# Patient Record
Sex: Female | Born: 1940
Health system: Southern US, Community
[De-identification: ages and names within clinical notes are randomized; demographics above are authoritative.]

## PROBLEM LIST (undated history)

## (undated) DIAGNOSIS — C50919 Malignant neoplasm of unspecified site of unspecified female breast: Secondary | ICD-10-CM

## (undated) DIAGNOSIS — H906 Mixed conductive and sensorineural hearing loss, bilateral: Secondary | ICD-10-CM

## (undated) DIAGNOSIS — K5000 Crohn's disease of small intestine without complications: Secondary | ICD-10-CM

## (undated) DIAGNOSIS — M545 Low back pain, unspecified: Secondary | ICD-10-CM

## (undated) DIAGNOSIS — M722 Plantar fascial fibromatosis: Secondary | ICD-10-CM

## (undated) DIAGNOSIS — L659 Nonscarring hair loss, unspecified: Secondary | ICD-10-CM

## (undated) DIAGNOSIS — K5790 Diverticulosis of intestine, part unspecified, without perforation or abscess without bleeding: Secondary | ICD-10-CM

## (undated) DIAGNOSIS — F605 Obsessive-compulsive personality disorder: Secondary | ICD-10-CM

## (undated) DIAGNOSIS — K449 Diaphragmatic hernia without obstruction or gangrene: Secondary | ICD-10-CM

## (undated) DIAGNOSIS — M1990 Unspecified osteoarthritis, unspecified site: Secondary | ICD-10-CM

## (undated) DIAGNOSIS — K2100 Gastro-esophageal reflux disease with esophagitis, without bleeding: Secondary | ICD-10-CM

## (undated) DIAGNOSIS — E785 Hyperlipidemia, unspecified: Secondary | ICD-10-CM

## (undated) DIAGNOSIS — I10 Essential (primary) hypertension: Secondary | ICD-10-CM

## (undated) DIAGNOSIS — K222 Esophageal obstruction: Secondary | ICD-10-CM

## (undated) DIAGNOSIS — K469 Unspecified abdominal hernia without obstruction or gangrene: Secondary | ICD-10-CM

## (undated) DIAGNOSIS — M810 Age-related osteoporosis without current pathological fracture: Secondary | ICD-10-CM

## (undated) DIAGNOSIS — K5900 Constipation, unspecified: Secondary | ICD-10-CM

## (undated) DIAGNOSIS — H269 Unspecified cataract: Secondary | ICD-10-CM

## (undated) DIAGNOSIS — M62838 Other muscle spasm: Secondary | ICD-10-CM

## (undated) DIAGNOSIS — K6289 Other specified diseases of anus and rectum: Secondary | ICD-10-CM

## (undated) DIAGNOSIS — K623 Rectal prolapse: Secondary | ICD-10-CM

## (undated) DIAGNOSIS — K589 Irritable bowel syndrome without diarrhea: Secondary | ICD-10-CM

## (undated) DIAGNOSIS — G43109 Migraine with aura, not intractable, without status migrainosus: Secondary | ICD-10-CM

## (undated) DIAGNOSIS — E039 Hypothyroidism, unspecified: Secondary | ICD-10-CM

## (undated) HISTORY — DX: Mixed conductive and sensorineural hearing loss, bilateral: H90.6

## (undated) HISTORY — DX: Malignant neoplasm of unspecified site of unspecified female breast: C50.919

## (undated) HISTORY — DX: Gastro-esophageal reflux disease with esophagitis, without bleeding: K21.00

## (undated) HISTORY — PX: TONSILLECTOMY: SUR1361

## (undated) HISTORY — DX: Plantar fascial fibromatosis: M72.2

## (undated) HISTORY — DX: Diaphragmatic hernia without obstruction or gangrene: K44.9

## (undated) HISTORY — DX: Rectal prolapse: K62.3

## (undated) HISTORY — DX: Irritable bowel syndrome, unspecified: K58.9

## (undated) HISTORY — PX: BREAST LUMPECTOMY: SHX2

## (undated) HISTORY — PX: ENTEROCELE REPAIR: SHX623

## (undated) HISTORY — DX: Esophageal obstruction: K22.2

## (undated) HISTORY — DX: Hyperlipidemia, unspecified: E78.5

## (undated) HISTORY — DX: Crohn's disease of small intestine without complications: K50.00

## (undated) HISTORY — DX: Low back pain, unspecified: M54.50

## (undated) HISTORY — DX: Low back pain: M54.5

## (undated) HISTORY — DX: Unspecified cataract: H26.9

## (undated) HISTORY — PX: OTHER SURGICAL HISTORY: SHX169

## (undated) HISTORY — DX: Unspecified osteoarthritis, unspecified site: M19.90

## (undated) HISTORY — PX: OOPHORECTOMY: SHX86

## (undated) HISTORY — DX: Age-related osteoporosis without current pathological fracture: M81.0

## (undated) HISTORY — DX: Essential (primary) hypertension: I10

## (undated) HISTORY — PX: HERNIA REPAIR: SHX51

## (undated) HISTORY — DX: Migraine with aura, not intractable, without status migrainosus: G43.109

## (undated) HISTORY — DX: Other specified diseases of anus and rectum: K62.89

## (undated) HISTORY — PX: APPENDECTOMY: SHX54

## (undated) HISTORY — DX: Hypothyroidism, unspecified: E03.9

## (undated) HISTORY — DX: Nonscarring hair loss, unspecified: L65.9

## (undated) HISTORY — DX: Unspecified abdominal hernia without obstruction or gangrene: K46.9

## (undated) HISTORY — DX: Constipation, unspecified: K59.00

## (undated) HISTORY — DX: Gastro-esophageal reflux disease with esophagitis: K21.0

## (undated) HISTORY — PX: DILATION AND CURETTAGE OF UTERUS: SHX78

## (undated) HISTORY — DX: Other muscle spasm: M62.838

## (undated) HISTORY — DX: Obsessive-compulsive personality disorder: F60.5

## (undated) HISTORY — DX: Diverticulosis of intestine, part unspecified, without perforation or abscess without bleeding: K57.90

---

## 1976-06-08 HISTORY — PX: BASAL CELL CARCINOMA EXCISION: SHX1214

## 1997-12-20 ENCOUNTER — Ambulatory Visit (HOSPITAL_BASED_OUTPATIENT_CLINIC_OR_DEPARTMENT_OTHER): Admission: RE | Admit: 1997-12-20 | Discharge: 1997-12-20 | Payer: Self-pay | Admitting: Plastic Surgery

## 1998-07-24 ENCOUNTER — Other Ambulatory Visit: Admission: RE | Admit: 1998-07-24 | Discharge: 1998-07-24 | Payer: Self-pay | Admitting: Obstetrics & Gynecology

## 1999-06-13 ENCOUNTER — Ambulatory Visit (HOSPITAL_COMMUNITY): Admission: RE | Admit: 1999-06-13 | Discharge: 1999-06-13 | Payer: Self-pay | Admitting: Internal Medicine

## 1999-09-02 ENCOUNTER — Other Ambulatory Visit: Admission: RE | Admit: 1999-09-02 | Discharge: 1999-09-02 | Payer: Self-pay | Admitting: Obstetrics & Gynecology

## 2000-03-05 ENCOUNTER — Other Ambulatory Visit: Admission: RE | Admit: 2000-03-05 | Discharge: 2000-03-05 | Payer: Self-pay | Admitting: Obstetrics & Gynecology

## 2001-03-28 ENCOUNTER — Other Ambulatory Visit: Admission: RE | Admit: 2001-03-28 | Discharge: 2001-03-28 | Payer: Self-pay | Admitting: Obstetrics & Gynecology

## 2001-05-26 ENCOUNTER — Emergency Department (HOSPITAL_COMMUNITY): Admission: EM | Admit: 2001-05-26 | Discharge: 2001-05-26 | Payer: Self-pay

## 2001-11-03 ENCOUNTER — Other Ambulatory Visit: Admission: RE | Admit: 2001-11-03 | Discharge: 2001-11-03 | Payer: Self-pay | Admitting: Obstetrics & Gynecology

## 2002-11-07 ENCOUNTER — Other Ambulatory Visit: Admission: RE | Admit: 2002-11-07 | Discharge: 2002-11-07 | Payer: Self-pay | Admitting: Obstetrics and Gynecology

## 2004-03-06 ENCOUNTER — Other Ambulatory Visit: Admission: RE | Admit: 2004-03-06 | Discharge: 2004-03-06 | Payer: Self-pay | Admitting: Obstetrics & Gynecology

## 2005-05-11 ENCOUNTER — Other Ambulatory Visit: Admission: RE | Admit: 2005-05-11 | Discharge: 2005-05-11 | Payer: Self-pay | Admitting: Obstetrics & Gynecology

## 2006-06-08 HISTORY — PX: HAIR TRANSPLANT: SHX1719

## 2006-10-06 ENCOUNTER — Ambulatory Visit: Payer: Self-pay | Admitting: Internal Medicine

## 2006-10-21 ENCOUNTER — Ambulatory Visit: Payer: Self-pay | Admitting: Internal Medicine

## 2006-11-09 ENCOUNTER — Ambulatory Visit: Payer: Self-pay | Admitting: Internal Medicine

## 2006-12-01 ENCOUNTER — Encounter: Admission: RE | Admit: 2006-12-01 | Discharge: 2006-12-06 | Payer: Self-pay | Admitting: Internal Medicine

## 2007-10-29 DIAGNOSIS — K4090 Unilateral inguinal hernia, without obstruction or gangrene, not specified as recurrent: Secondary | ICD-10-CM | POA: Insufficient documentation

## 2007-10-29 DIAGNOSIS — R51 Headache: Secondary | ICD-10-CM

## 2007-10-29 DIAGNOSIS — K5730 Diverticulosis of large intestine without perforation or abscess without bleeding: Secondary | ICD-10-CM | POA: Insufficient documentation

## 2007-10-29 DIAGNOSIS — R519 Headache, unspecified: Secondary | ICD-10-CM | POA: Insufficient documentation

## 2007-10-29 DIAGNOSIS — K623 Rectal prolapse: Secondary | ICD-10-CM | POA: Insufficient documentation

## 2007-10-29 DIAGNOSIS — I10 Essential (primary) hypertension: Secondary | ICD-10-CM

## 2007-10-29 DIAGNOSIS — K589 Irritable bowel syndrome without diarrhea: Secondary | ICD-10-CM

## 2007-10-29 DIAGNOSIS — Z8719 Personal history of other diseases of the digestive system: Secondary | ICD-10-CM

## 2009-07-26 ENCOUNTER — Encounter: Admission: RE | Admit: 2009-07-26 | Discharge: 2009-07-26 | Payer: Self-pay | Admitting: General Surgery

## 2009-07-29 ENCOUNTER — Ambulatory Visit: Admission: RE | Admit: 2009-07-29 | Discharge: 2009-09-02 | Payer: Self-pay | Admitting: Radiation Oncology

## 2009-08-14 ENCOUNTER — Encounter: Admission: RE | Admit: 2009-08-14 | Discharge: 2009-08-14 | Payer: Self-pay | Admitting: General Surgery

## 2009-08-19 ENCOUNTER — Ambulatory Visit (HOSPITAL_BASED_OUTPATIENT_CLINIC_OR_DEPARTMENT_OTHER): Admission: RE | Admit: 2009-08-19 | Discharge: 2009-08-19 | Payer: Self-pay | Admitting: General Surgery

## 2009-09-01 LAB — WOUND CULTURE

## 2009-09-04 ENCOUNTER — Encounter: Payer: Self-pay | Admitting: Internal Medicine

## 2009-09-04 ENCOUNTER — Ambulatory Visit: Payer: Self-pay | Admitting: Oncology

## 2009-09-04 LAB — CBC WITH DIFFERENTIAL/PLATELET
BASO%: 0.4 % (ref 0.0–2.0)
LYMPH%: 11.3 % — ABNORMAL LOW (ref 14.0–49.7)
MONO#: 0.4 10*3/uL (ref 0.1–0.9)
MONO%: 7.5 % (ref 0.0–14.0)
NEUT#: 4.4 10*3/uL (ref 1.5–6.5)
NEUT%: 77.1 % — ABNORMAL HIGH (ref 38.4–76.8)
Platelets: 365 10*3/uL (ref 145–400)
RBC: 4.15 10*6/uL (ref 3.70–5.45)
RDW: 12.2 % (ref 11.2–14.5)
lymph#: 0.6 10*3/uL — ABNORMAL LOW (ref 0.9–3.3)

## 2009-09-04 LAB — COMPREHENSIVE METABOLIC PANEL
Albumin: 4.2 g/dL (ref 3.5–5.2)
BUN: 18 mg/dL (ref 6–23)
Calcium: 9.4 mg/dL (ref 8.4–10.5)
Chloride: 102 mEq/L (ref 96–112)
Creatinine, Ser: 0.81 mg/dL (ref 0.40–1.20)
Glucose, Bld: 93 mg/dL (ref 70–99)
Potassium: 4.2 mEq/L (ref 3.5–5.3)
Total Bilirubin: 0.3 mg/dL (ref 0.3–1.2)
Total Protein: 6.8 g/dL (ref 6.0–8.3)

## 2009-10-28 ENCOUNTER — Encounter: Payer: Self-pay | Admitting: Internal Medicine

## 2009-10-30 ENCOUNTER — Encounter: Payer: Self-pay | Admitting: Internal Medicine

## 2009-11-01 ENCOUNTER — Ambulatory Visit: Payer: Self-pay | Admitting: Oncology

## 2009-11-05 ENCOUNTER — Encounter: Payer: Self-pay | Admitting: Internal Medicine

## 2010-02-21 ENCOUNTER — Ambulatory Visit: Payer: Self-pay | Admitting: Oncology

## 2010-02-25 LAB — COMPREHENSIVE METABOLIC PANEL
ALT: 12 U/L (ref 0–35)
Albumin: 4.3 g/dL (ref 3.5–5.2)
Chloride: 105 mEq/L (ref 96–112)
Glucose, Bld: 89 mg/dL (ref 70–99)
Potassium: 4.5 mEq/L (ref 3.5–5.3)
Sodium: 140 mEq/L (ref 135–145)
Total Protein: 6.9 g/dL (ref 6.0–8.3)

## 2010-02-25 LAB — CBC WITH DIFFERENTIAL/PLATELET
BASO%: 1.1 % (ref 0.0–2.0)
EOS%: 1.4 % (ref 0.0–7.0)
HCT: 41.1 % (ref 34.8–46.6)
HGB: 13.9 g/dL (ref 11.6–15.9)
LYMPH%: 26.4 % (ref 14.0–49.7)
MCH: 33 pg (ref 25.1–34.0)
MCHC: 33.9 g/dL (ref 31.5–36.0)
MCV: 97.4 fL (ref 79.5–101.0)
MONO#: 0.4 10*3/uL (ref 0.1–0.9)
MONO%: 8.8 % (ref 0.0–14.0)
Platelets: 287 10*3/uL (ref 145–400)
RBC: 4.22 10*6/uL (ref 3.70–5.45)
WBC: 4.9 10*3/uL (ref 3.9–10.3)

## 2010-03-04 ENCOUNTER — Encounter: Payer: Self-pay | Admitting: Internal Medicine

## 2010-06-08 HISTORY — PX: OTHER SURGICAL HISTORY: SHX169

## 2010-07-08 NOTE — Letter (Signed)
[facility]   Imported By: Sherian Rein 10/03/2009 08:10:04  _____________________________________________________________________  External Attachment:    Type:   Image     Comment:   External Document

## 2010-07-08 NOTE — Letter (Signed)
[facility]   Imported By: Lester Florence 11/21/2009 10:40:15  _____________________________________________________________________  External Attachment:    Type:   Image     Comment:   External Document

## 2010-07-08 NOTE — Letter (Signed)
Summary: [facility]   Imported By: Sherian Rein 11/11/2009 15:08:27  _____________________________________________________________________  External Attachment:    Type:   Image     Comment:   External Document

## 2010-07-08 NOTE — Letter (Signed)
Summary: [facility]  [facility]   Imported By: Sherian Rein 03/31/2010 12:24:04  _____________________________________________________________________  External Attachment:    Type:   Image     Comment:   External Document

## 2010-08-29 LAB — DIFFERENTIAL
Basophils Absolute: 0 10*3/uL (ref 0.0–0.1)
Basophils Relative: 1 % (ref 0–1)
Eosinophils Absolute: 0 10*3/uL (ref 0.0–0.7)
Lymphocytes Relative: 20 % (ref 12–46)
Monocytes Relative: 9 % (ref 3–12)
Neutrophils Relative %: 70 % (ref 43–77)

## 2010-08-29 LAB — COMPREHENSIVE METABOLIC PANEL
ALT: 19 U/L (ref 0–35)
Alkaline Phosphatase: 72 U/L (ref 39–117)
BUN: 17 mg/dL (ref 6–23)
Calcium: 9.3 mg/dL (ref 8.4–10.5)
Creatinine, Ser: 0.72 mg/dL (ref 0.4–1.2)
Total Bilirubin: 0.5 mg/dL (ref 0.3–1.2)
Total Protein: 6.9 g/dL (ref 6.0–8.3)

## 2010-08-29 LAB — CBC
HCT: 40.3 % (ref 36.0–46.0)
Hemoglobin: 14 g/dL (ref 12.0–15.0)
MCHC: 34.8 g/dL (ref 30.0–36.0)
Platelets: 332 10*3/uL (ref 150–400)
RDW: 12.1 % (ref 11.5–15.5)

## 2010-10-21 NOTE — Assessment & Plan Note (Signed)
 HEALTHCARE                         GASTROENTEROLOGY OFFICE NOTE   SHELEEN, CONCHAS                        MRN:          [id number]  DATE:11/09/2006                            DOB:          1940/06/25    Ms. Hor is a 70 year old white female who has problems with evacuation  of the stool, leakage of the stool, and some constipation.  We have  recently completed the colonoscopic exam which confirms presence of  severe diverticulosis of the left and right colon, predominantly of the  left colon with some narrowing of the left colon.  She was not prepped  well despite taking the entire MiraLax solution.  She is back to her  problems of leakage of stool almost every time she urinates.  We have  put her on a stool softener one or two a day and Benefiber 3 grams  daily.  She has a history of rectocele and enterocele repaired at [facility]  in 2003 with culdoplasty by Dr. Farrel Conners.   Today we have discussed further plans for her evaluation and treatment.  Her problems are quite uncomfortable and more of a nuisance than a  serious problem, but it limits her quality of life.   PLAN:  1. The patient is going to see Dr. Konrad Dolores for evaluation of      recurrent rectocele.  2. Consider going back to [facility] to be reassessed for recurrent      rectocele.  3. Continue softener and high fiber diet.  4. Use glycerin suppositories to facilitate rectal emptying.     Hedwig Morton. Juanda Chance, MD  Electronically Signed    DMB/MedQ  DD: 11/09/2006  DT: 11/09/2006  Job #: [id number]   cc:   Lenon Curt. Chilton Si, M.D.  Freddy Finner, M.D.

## 2010-10-24 NOTE — Assessment & Plan Note (Signed)
[facility]                         GASTROENTEROLOGY OFFICE NOTE   Marilyn [name], Marilyn [name]                        MRN:          [id number]  DATE:10/06/2006                            DOB:          Apr 02, 1941    Marilyn [name] is a very nice 70 year old white female.  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  She comes today because of several accidents  of rectal leakage; one occurred while she was vacationing over the  [place] vacation on a trip.  She thought she passed some flatus, but it  was some brownish liquid.  It is her impression that she has decreased  rectal sphincter tone.  After I saw her in 2001, she was referred to  [facility], to Dr. Doy Hutching, for repair of enterocele. She had a prolapse of  the small bowel into the rectum and in 2003 underwent culdoplasty at  [facility] for repair of the enterocele.  She at that time also had a  cystometrogram at [facility], and they determined that her bladder function  was normal.  During exploratory laparotomy in 2003, she also underwent  bilateral salpingo-oophorectomy.   The patient has irregular bowel habits.  She takes fiber pills 2 a day  to total of about 1300 mg of additional fiber.  She has frequent bowel  movements with occasional diarrhea and urgency.   MEDICATIONS:  1. Prempro 0.3 mg/1.5 daily.  2. Amlodipine 5 mg p.o. daily.  3. Diclofenac 75 mg every other day.  4. Flutamide 125 mg 2 daily.  5. Vitamin E.  6. Multivitamins.  7. Calcium.  8. Selenium.  9. Vitamin C  10.Glucosamine.  11.Aspirin 81 mg daily.  12.Fiber 675 mg 2 a day.   PAST MEDICAL HISTORY:  Significant for high blood pressure currently  under control.   OPERATIONS:  1. Exploratory laparotomy  2. Culdoplasty.  3. Appendectomy in 1965.  4. Tonsillectomy.  5. Right eye surgery.   FAMILY  HISTORY:  Negative for colon cancer.  Breast cancer in aunt.  Alcoholism in mother, uncle, brother, and nephew.   SOCIAL HISTORY:  Single.  Has master's degree.  She is retired.  The  patient does not smoke.  She does not drink alcohol.   REVIEW OF SYSTEMS:  Positive for eye glasses, allergies, arthritis,  sleeping problems, mood changes, question of heart murmur.   PHYSICAL EXAMINATION:  VITAL SIGNS:  Blood pressure 114/72, pulse 68.  Weight 151 pounds.  GENERAL:  She was alert and oriented, in no distress.  LUNGS:  Clear to auscultation.  COR:  Normal S1, normal S2.  ABDOMEN:  Soft. Normoactive bowel sounds.  There is no tenderness, no  palpable mass, no tympany.  RECTAL: Endoscopic exam shows normal perianal area, no prolapse or skin  tears.  Sphincter tone was decreased.  She had a weak squeeze.  There  was a spacious rectal ampulla with redundant rectal tissue suggestive of  a rectocele.  Stool was soft, hemoccult negative.  There were no  internal hemorrhoids.   IMPRESSION:  A 70 year old white female with history of universal  diverticulosis of the left and right colon, now with decreased rectal  sphincter tone and suggestion of pelvic relaxation causing mild  rectocele.  She had a recent repair of enterocele which effectively  prevented the prolapse of the small bowel but really did not alter her  pelvic relaxation which is causing her to not evacuate and have leakage  at times.   PLAN:  I have discussed the treatment with the patient.  There is really  no good effective treatment.  She needs to build up a good pelvic muscle  tone.  For that reason, I would encourage her to stay very active,  especially walk and do abdominal exercises.  I also feel that she needs  to eat more fiber.  If we can increase the bulk of the stool.  We are  increasing her to take Fibercon 3 g a day and in addition give her a  high-fiber diet.  Would like her to check back with me in a couple of   months.   Colonoscopy has been scheduled to assess the severity of the  diverticulosis.  The patient is interested in being updated as to the  severity of it to rule out partial left colon obstruction with the  diverticulosis.     Hedwig Morton. Juanda Chance, MD  Electronically Signed    DMB/MedQ  DD: 10/06/2006  DT: 10/06/2006  Job #: [id number]   cc:   Lenon Curt. Chilton Si, M.D.

## 2010-10-29 ENCOUNTER — Other Ambulatory Visit: Payer: Self-pay | Admitting: Dermatology

## 2011-03-05 ENCOUNTER — Encounter (HOSPITAL_BASED_OUTPATIENT_CLINIC_OR_DEPARTMENT_OTHER): Payer: Medicare Other | Admitting: Oncology

## 2011-03-05 DIAGNOSIS — E559 Vitamin D deficiency, unspecified: Secondary | ICD-10-CM

## 2011-03-05 DIAGNOSIS — Z170 Estrogen receptor positive status [ER+]: Secondary | ICD-10-CM

## 2011-03-05 DIAGNOSIS — C50419 Malignant neoplasm of upper-outer quadrant of unspecified female breast: Secondary | ICD-10-CM

## 2011-05-11 ENCOUNTER — Other Ambulatory Visit: Payer: Self-pay | Admitting: *Deleted

## 2011-05-11 DIAGNOSIS — C50419 Malignant neoplasm of upper-outer quadrant of unspecified female breast: Secondary | ICD-10-CM

## 2011-05-11 MED ORDER — TAMOXIFEN CITRATE 20 MG PO TABS
20.0000 mg | ORAL_TABLET | Freq: Every day | ORAL | Status: AC
Start: 2011-05-11 — End: 2011-06-10

## 2011-06-10 DIAGNOSIS — D3131 Benign neoplasm of right choroid: Secondary | ICD-10-CM | POA: Insufficient documentation

## 2011-07-08 ENCOUNTER — Encounter (INDEPENDENT_AMBULATORY_CARE_PROVIDER_SITE_OTHER): Payer: Self-pay

## 2011-08-31 DIAGNOSIS — L249 Irritant contact dermatitis, unspecified cause: Secondary | ICD-10-CM | POA: Insufficient documentation

## 2011-09-28 ENCOUNTER — Ambulatory Visit (INDEPENDENT_AMBULATORY_CARE_PROVIDER_SITE_OTHER): Payer: Medicare Other | Admitting: General Surgery

## 2011-09-28 ENCOUNTER — Encounter (INDEPENDENT_AMBULATORY_CARE_PROVIDER_SITE_OTHER): Payer: Self-pay | Admitting: General Surgery

## 2011-09-28 VITALS — BP 152/88 | Temp 97.3°F | Resp 16 | Ht 65.5 in | Wt 157.6 lb

## 2011-09-28 DIAGNOSIS — Z853 Personal history of malignant neoplasm of breast: Secondary | ICD-10-CM

## 2011-09-28 NOTE — Progress Notes (Signed)
Subjective:     Patient ID: Marilyn [name], female   DOB: [birth year], 71 y.o.   MRN: [id number]  HPI 69 yof s/p right lumpectomy, snbx, apbi, now on tamoxifen for stage I right breast cancer.  She is doing well.  She still has evolving seroma at her mammosite location but is otherwise doing ok since I last saw her.  She had recent mmg that shows post lumpectomy changes and overall decrease in lumpectomy site without any other concerning abnormalities.  She had u/s over lumpectomy site that shows a multiloculated seroma.    Review of Systems  Constitutional: Negative for fever, chills and unexpected weight change.  HENT: Negative for hearing loss, congestion, sore throat, trouble swallowing and voice change.   Eyes: Negative for visual disturbance.  Respiratory: Negative for cough and wheezing.   Cardiovascular: Negative for chest pain, palpitations and leg swelling.  Gastrointestinal: Negative for nausea, vomiting, abdominal pain, diarrhea, constipation, blood in stool, abdominal distention and anal bleeding.  Genitourinary: Negative for hematuria, vaginal bleeding and difficulty urinating.  Musculoskeletal: Negative for arthralgias.  Skin: Negative for rash and wound.  Neurological: Negative for seizures, syncope and headaches.  Hematological: Negative for adenopathy. Does not bruise/bleed easily.  Psychiatric/Behavioral: Negative for confusion.       Objective:   Physical Exam  Vitals reviewed. Constitutional: She appears well-developed and well-nourished.  Neck: Neck supple.  Pulmonary/Chest: Right breast exhibits no inverted nipple, no mass, no nipple discharge, no skin change and no tenderness. Left breast exhibits no inverted nipple, no mass, no nipple discharge, no skin change and no tenderness. Breasts are symmetrical.    Lymphadenopathy:    She has no cervical adenopathy.    She has no axillary adenopathy.       Right: No supraclavicular adenopathy present.       Left: No  supraclavicular adenopathy present.       Assessment:    History stage I right breast cancer    Plan:     I think this is just seroma cavity by exam, history and ultrasound.  She is going to continue her own exams monthly, repeat her u/s in six months.  I will plan on seeing her in one year unless something else occurs sooner.

## 2011-09-28 NOTE — Patient Instructions (Signed)

## 2011-12-11 ENCOUNTER — Ambulatory Visit
Admission: RE | Admit: 2011-12-11 | Discharge: 2011-12-11 | Disposition: A | Discharge status: Home / Self Care | Payer: Medicare Other | Source: Ambulatory Visit | Attending: Internal Medicine | Admitting: Internal Medicine

## 2011-12-11 ENCOUNTER — Other Ambulatory Visit: Payer: Self-pay | Admitting: Internal Medicine

## 2011-12-11 DIAGNOSIS — M549 Dorsalgia, unspecified: Secondary | ICD-10-CM

## 2012-01-08 ENCOUNTER — Telehealth: Payer: Self-pay | Admitting: Oncology

## 2012-01-08 NOTE — Telephone Encounter (Signed)
S/w pt confirming appts for 9/24 and 10/1.

## 2012-02-15 ENCOUNTER — Other Ambulatory Visit: Payer: Self-pay | Admitting: *Deleted

## 2012-02-15 NOTE — Progress Notes (Signed)
Lab report received from Dr Lenoria Farrier Thomasene Lot office dated 11/13/2011.

## 2012-03-01 ENCOUNTER — Other Ambulatory Visit: Payer: Medicare Other | Admitting: Lab

## 2012-03-08 ENCOUNTER — Telehealth: Payer: Self-pay | Admitting: *Deleted

## 2012-03-08 ENCOUNTER — Telehealth: Payer: Self-pay | Admitting: Internal Medicine

## 2012-03-08 ENCOUNTER — Ambulatory Visit (HOSPITAL_BASED_OUTPATIENT_CLINIC_OR_DEPARTMENT_OTHER): Payer: Medicare Other | Admitting: Oncology

## 2012-03-08 VITALS — BP 169/66 | HR 83 | Temp 98.6°F | Resp 20 | Ht 65.5 in | Wt 158.7 lb

## 2012-03-08 DIAGNOSIS — C50419 Malignant neoplasm of upper-outer quadrant of unspecified female breast: Secondary | ICD-10-CM

## 2012-03-08 DIAGNOSIS — C50919 Malignant neoplasm of unspecified site of unspecified female breast: Secondary | ICD-10-CM

## 2012-03-08 DIAGNOSIS — Z170 Estrogen receptor positive status [ER+]: Secondary | ICD-10-CM

## 2012-03-08 MED ORDER — TAMOXIFEN CITRATE 20 MG PO TABS: 20.0000 mg | ORAL_TABLET | Freq: Every day | ORAL | Status: DC

## 2012-03-08 MED ORDER — ALENDRONATE SODIUM 70 MG PO TABS: 70.0000 mg | ORAL_TABLET | ORAL | Status: DC

## 2012-03-08 NOTE — Telephone Encounter (Signed)
Left a message for Lori to call me back.

## 2012-03-08 NOTE — Telephone Encounter (Signed)
to see Marilyn [name], next available, "change in bowel habits;"  Called spoke with Tobi Bastos she will have the nurse call me back  Dr.brodie does not have any openings until Dec. 2013

## 2012-03-08 NOTE — Progress Notes (Signed)
ID: Marilyn [name]   DOB: 08-Dec-1940  MR#: [id number]  CSN#:[id number]  PCP: Kimber Relic, MD GYN:  SUSusy Frizzle. Dwain Sarna OTHER MD: Venancio Poisson, Audrie Lia   HISTORY OF PRESENT ILLNESS: Marilyn [name] had routine screening mammography June 17, 2009, showing dense breast tissue with new calcifications in the upper quadrant on the right breast.  She was recalled on January 12 for diagnostic right mammography and this suggested a benign area of calcification; however, posterior to these there was a set of new microcalcifications and biopsy was recommended.  This was performed on July 09, 2009, and showed ([id number]) an invasive ductal carcinoma with high-grade ductal carcinoma in situ.  The prognostic profile showed the tumor cells to be 93% estrogen receptor positive, 100% progesterone receptor positive, with a low proliferation marker at 11% and HER-2 negative with a ratio by CISH of 1.03.    With this information, the patient was referred to Dr Dwain Sarna and bilateral breast MRIs were obtained February 18.  There were only post-biopsy changes seen in the upper outer quadrant of the right breast.  The left breast and the rest of the scan were unremarkable.  With this information, Dr Dwain Sarna proceeded to left lumpectomy and sentinel lymph node sampling on August 19, 2009.  The final pathology here ([id number]) showed a 6 mm invasive ductal carcinoma, grade 1, with 0 of 4 sentinel lymph nodes involved.  Margins were negative and there was no evidence of lymphovascular invasion.    The patient was felt to be a good candidate for MammoSite radiation and she was referred to Dr Dayton Scrape.  After appropriate discussion, the patient agree to MammoSite radiation and this was performed between March 21 and August 30, 2009, the breast receiving 3400 cGy in 10 fractions. Her subsequent history is as detailed below.  INTERVAL HISTORY: Marilyn [name] returns today for followup of her breast cancer. The  interval history is unremarkable. She continues to be very busy with multiple volunteer activities and her 2 dogs at home.  REVIEW OF SYSTEMS: She is having some sleeping problems. We discussed the idea of always waking up at the same time in the morning to help the body I did use to falling asleep at the same time every night. Her cataracts are grown some and she is planning to have surgery under Dr. Dagoberto Ligas. She has occasional premature atrial beats, and sometimes after exertion she feels a little more short of breath than she thinks she should be. There are some urinary leakage syndrome's and mild psoriasis. She is having hot flashes from the tamoxifen but she can "deal with that". She's not having any other symptoms she is aware of from that medication. In a recent trip to [place] she noted her ankles were a little swollen. Otherwise a detailed review of systems was noncontributory  PAST MEDICAL HISTORY: Past Medical History  Diagnosis Date  . Hypertension   . Cancer     stage I right breast  1. Hypertension. 2. History of premature atrial arrhythmias. 3. History of Raynaud syndrome. 4. History of irritable bowel syndrome. 5. Status post tonsillectomy. 6. Status post inguinal hernia repair. 7. Status post strabismus repair. 8. Status post appendectomy. 9. Status post basal cell skin cancer removal in 1978. 10. History of D&C in 2003. 11. History of enterocele repair at [facility] with bilateral oophorectomies performed at the same time (1985). 12. Status post removal of a benign breast cyst. Status post remote history of migraines  PAST SURGICAL HISTORY: Past  Surgical History  Procedure Date  . Hernia repair   . Appendectomy   . Eye surgery   . Oophorectomy   . Breast surgery     right lumpectomy, snbx, apbi    FAMILY HISTORY No family history on file. The patient's father died with Alzheimer disease at the age of 29.  The patient's mother died at the age of 100 from a stroke;  she was a smoker.  The patient has 1 brother and 1 sister; as far as she knows they are in good health.  There is no significant history of breast cancer in the family other than 2 maternal aunts who had breast cancer in their 68s.  GYNECOLOGIC HISTORY: The patient is G0.  She was on Prempro for several years until February 2011  SOCIAL HISTORY: She worked for the [employer] for many years and also taught at [employer].  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  She also does a lot of bird watching.  Her 2 dogs currently are a Orthoptist and a terrier mix.   ADVANCED DIRECTIVES: in place; her HCPOA is Marilyn [name], who can be reached at [phone]  HEALTH MAINTENANCE: History  Substance Use Topics  . Smoking status: Never Smoker   . Smokeless tobacco: Not on file  . Alcohol Use: No     Colonoscopy:  PAP:  Bone density:  Lipid panel:  No Known Allergies  Current Outpatient Prescriptions  Medication Sig Dispense Refill  . AMLODIPINE BESYLATE PO Take by mouth.      . FLUTAMIDE PO Take by mouth.      . TAMOXIFEN CITRATE PO Take by mouth.        OBJECTIVE: Middle-aged white woman who appears well Filed Vitals:   03/08/12 1124  BP: 169/66  Pulse: 83  Temp: 98.6 F (37 C)  Resp: 20     Body mass index is 26.01 kg/(m^2).    ECOG FS: 0  Sclerae unicteric Oropharynx clear No cervical or supraclavicular adenopathy Lungs no rales or rhonchi Heart regular rate and rhythm, no premature beats or murmur noted Abd benign MSK no focal spinal tenderness, no peripheral edema Neuro: nonfocal Breasts: The right breast is status post lumpectomy and MammoSite radiation. There is no evidence of local recurrence. The right axilla is clear. The left breast is unremarkable  LAB RESULTS: Lab Results  Component Value Date   WBC 4.9 02/25/2010   NEUTROABS 3.0 02/25/2010   HGB 13.9 02/25/2010   HCT 41.1 02/25/2010   MCV 97.4  02/25/2010   PLT 287 02/25/2010      Chemistry      Component Value Date/Time   NA 140 02/25/2010 1356   K 4.5 02/25/2010 1356   CL 105 02/25/2010 1356   CO2 30 02/25/2010 1356   BUN 19 02/25/2010 1356   CREATININE 0.89 02/25/2010 1356      Component Value Date/Time   CALCIUM 9.5 02/25/2010 1356   ALKPHOS 58 02/25/2010 1356   AST 18 02/25/2010 1356   ALT 12 02/25/2010 1356   BILITOT 0.3 02/25/2010 1356       Lab Results  Component Value Date   LABCA2 17 08/14/2009    No components found with this basename: LABCA125    No results found for this basename: INR:1;PROTIME:1 in the last 168 hours  Urinalysis No results found for this basename: colorurine, appearanceur, labspec, phurine, glucoseu, hgbur, bilirubinur, ketonesur, proteinur, urobilinogen, nitrite, leukocytesur  STUDIES: Mammography January of this year and repeat right breast ultrasonography August of this year were both benign. DEXA scan also in August showed a T-2.3  ASSESSMENT: 71 y.o. [place] woman status post right lumpectomy and sentinel lymph node biopsy March 2011 for a T1b N0 grade 1 invasive ductal carcinoma, strongly estrogen and progesterone receptor positive with an MIB-1 of 11% and HER2 not amplified.  After MammoSite radiation, she started tamoxifen in late March 2011.    PLAN: Marilyn [name] going to consider switching to an aromatase inhibitor at this point, but since her last visit here new data has been published regarding extending tamoxifen to 10 years. The additional benefit of that is similar to that of switching to an aromatase inhibitor. Of course the grade disadvantage of the aromatase inhibitors is a loss of bone density and Marilyn [name] is already close to osteoporosis. Accordingly my recommendation to her was that she continue on tamoxifen for the full 10 years but also that she consider going on alendronate. We discussed the possible toxicities side effects and complications of that medication including  concerns regarding osteonecrosis of the jaw. I gave her written instructions on how to take the pill and wrote her a prescription for it.  We talked about some issues regarding ankle swelling, though that problem was an apparent today. My suggestion was that she try compression stockings and a low salt diet. Overall Marilyn [name] is doing great, with no evidence of disease recurrence. She will return to see me again in one year. She knows to call for any problems that may develop before that date.   Marilyn [name] C    03/08/2012

## 2012-03-08 NOTE — Telephone Encounter (Signed)
Spoke with Lawson Fiscal and scheduled patient on 03/09/12 at 2:30 PM with Mike Gip, PA

## 2012-03-10 ENCOUNTER — Other Ambulatory Visit: Payer: Medicare Other

## 2012-03-10 ENCOUNTER — Ambulatory Visit (INDEPENDENT_AMBULATORY_CARE_PROVIDER_SITE_OTHER): Payer: Medicare Other | Admitting: Physician Assistant

## 2012-03-10 ENCOUNTER — Encounter: Payer: Self-pay | Admitting: Physician Assistant

## 2012-03-10 VITALS — BP 124/70 | HR 72 | Ht 65.0 in | Wt 158.0 lb

## 2012-03-10 DIAGNOSIS — R198 Other specified symptoms and signs involving the digestive system and abdomen: Secondary | ICD-10-CM

## 2012-03-10 DIAGNOSIS — R140 Abdominal distension (gaseous): Secondary | ICD-10-CM

## 2012-03-10 DIAGNOSIS — R141 Gas pain: Secondary | ICD-10-CM

## 2012-03-10 DIAGNOSIS — R159 Full incontinence of feces: Secondary | ICD-10-CM

## 2012-03-10 DIAGNOSIS — Z853 Personal history of malignant neoplasm of breast: Secondary | ICD-10-CM

## 2012-03-10 DIAGNOSIS — R194 Change in bowel habit: Secondary | ICD-10-CM

## 2012-03-10 MED ORDER — MOVIPREP 100 G PO SOLR: 1.0000 | Freq: Once | ORAL | Status: AC

## 2012-03-10 NOTE — Progress Notes (Signed)
Subjective:    Patient ID: Marilyn [name], female    DOB: January 23, 1941, 71 y.o.   MRN: [id number]  HPI Marilyn [name] is a 71 year old female known to Dr. Lina Sar from prior colonoscopy. She last had colonoscopy in May of 2008 at that time was noted to have significant diverticular disease of the right and left colon and decreased sphincter tone. She underwent an enterocele repair at [facility] a few years ago, and also was diagnosed with breast cancer in 2011. She had a lumpectomy done of the left breast and then radiation for stage I disease. She has been on tamoxifen. She had recent evaluation with her gynecologist to suggested she consider repeat colonoscopy. Patient says that she has been told in the past that she probably has IBS. She has never been tested for celiac disease. She says she's had symptoms for years with intermittent abdominal bloating and discomfort urgency and occasional episodes of diarrhea with complete bowel evacuation She has had problems with fecal soilage over the past several years and eats a very high fiber diet which he says definitely helps. She says sometimes when she urinates she'll have some seepage of stool at this point past where protection all the time. She still has occasional episodes of urgency and rare episodes of fecal incontinence. As not noted any melena or hematochezia. She says she has been having increased frequency of bowel movements recently. On further questioning she does use artificial sweeteners and also has been eating dried apricots daily.. She does endorse  abdominal bloating and gas.    Review of Systems  Constitutional: Negative.   HENT: Negative.   Eyes: Negative.   Respiratory: Negative.   Cardiovascular: Negative.   Gastrointestinal: Positive for diarrhea and abdominal distention.  Genitourinary: Negative.   Musculoskeletal: Negative.   Skin: Negative.   Neurological: Negative.   Hematological: Negative.   Psychiatric/Behavioral:  Negative.    Outpatient Prescriptions Prior to Visit  Medication Sig Dispense Refill  . alendronate (FOSAMAX) 70 MG tablet Take 1 tablet (70 mg total) by mouth every 7 (seven) days. Take with a full glass of water on an empty stomach.  15 tablet  6  . AMLODIPINE BESYLATE PO Take by mouth.      . FLUTAMIDE PO Take by mouth.      . tamoxifen (NOLVADEX) 20 MG tablet Take 1 tablet (20 mg total) by mouth daily.  90 tablet  12   No Known Allergies Patient Active Problem List  Diagnosis  . HYPERTENSION  . INGUINAL HERNIA  . DIVERTICULOSIS, COLON  . IBS  . RECTAL PROLAPSE  . HEADACHE, CHRONIC  . Breast cancer       History   Social History  . Marital Status: Single    Spouse Name: N/A    Number of Children: 0  . Years of Education: N/A   Occupational History  . Not on file.   Social History Main Topics  . Smoking status: Never Smoker   . Smokeless tobacco: Never Used  . Alcohol Use: No  . Drug Use: No  . Sexually Active: Not on file   Other Topics Concern  . Not on file   Social History Narrative  . No narrative on file    Objective:   Physical Exam well-developed older white female in no acute distress, pleasant blood pressure 124/70 pulse 72 height 5 foot 5 weight 158. HEENT; nontraumatic normocephalic EOMI PERRLA sclera anicteric,Neck; Supple no JVD, Cardiovascular; regular rate and rhythm with S1-S2 no  murmur or gallop, Pulmonary; clear bilaterally,, Abdomen; soft basically nontender there is no palpable mass or hepatosplenomegaly bowel sounds are active, Rectal; exam not done, Extremities; no clubbing cyanosis or edema skin warm and dry, Psych; mood and affect normal and appropriate.        Assessment & Plan:  #47 72 year old female with history of breast cancer, last screening colonoscopy done 2008. #2 extensive diverticular disease #3 probable IBS, with intermittent urgency and diarrhea and frequent abdominal gas and bloating. Will rule out celiac disease #4  fecal incontinence secondary to pelvic floor dysfunction and sphincter laxity. She does not have complete incontinence generally. She does have ongoing issues with soilage #5 status post enterocele repair  Plan; continue fiber supplements on a daily basis and high-fiber diet, advised trying to limit 8 artificial sweeteners and decreasing intake of dried fruit with increased complaints of gas and more frequent bowel movements We'll check celiac panel Schedule for colonoscopy with Dr. Juanda Chance. Procedure discussed in detail with the patient and she is agreeable to proceed.

## 2012-03-10 NOTE — Patient Instructions (Addendum)
Please go to the basement level to have your labs drawn.  We sent the Moviprep to [facility]. We have scheduled the Colonoscopy with Dr. Juanda Chance on 05-03-2012 @ 2:30 PM   Colonoscopy A colonoscopy is an exam to evaluate your entire colon. In this exam, your colon is cleansed. A long fiberoptic tube is inserted through your rectum and into your colon. The fiberoptic scope (endoscope) is a long bundle of enclosed and very flexible fibers. These fibers transmit light to the area examined and send images from that area to your caregiver. Discomfort is usually minimal. You may be given a drug to help you sleep (sedative) during or prior to the procedure. This exam helps to detect lumps (tumors), polyps, inflammation, and areas of bleeding. Your caregiver may also take a small piece of tissue (biopsy) that will be examined under a microscope. LET YOUR CAREGIVER KNOW ABOUT:   Allergies to food or medicine.  Medicines taken, including vitamins, herbs, eyedrops, over-the-counter medicines, and creams.  Use of steroids (by mouth or creams).  Previous problems with anesthetics or numbing medicines.  History of bleeding problems or blood clots.  Previous surgery.  Other health problems, including diabetes and kidney problems.  Possibility of pregnancy, if this applies. BEFORE THE PROCEDURE   A clear liquid diet may be required for 2 days before the exam.  Ask your caregiver about changing or stopping your regular medications.  Liquid injections (enemas) or laxatives may be required.  A large amount of electrolyte solution may be given to you to drink over a short period of time. This solution is used to clean out your colon.  You should be present 60 minutes prior to your procedure or as directed by your caregiver. AFTER THE PROCEDURE   If you received a sedative or pain relieving medication, you will need to arrange for someone to drive you home.  Occasionally, there is a little  blood passed with the first bowel movement. Do not be concerned. FINDING OUT THE RESULTS OF YOUR TEST Not all test results are available during your visit. If your test results are not back during the visit, make an appointment with your caregiver to find out the results. Do not assume everything is normal if you have not heard from your caregiver or the medical facility. It is important for you to follow up on all of your test results. HOME CARE INSTRUCTIONS   It is not unusual to pass moderate amounts of gas and experience mild abdominal cramping following the procedure. This is due to air being used to inflate your colon during the exam. Walking or a warm pack on your belly (abdomen) may help.  You may resume all normal meals and activities after sedatives and medicines have worn off.  Only take over-the-counter or prescription medicines for pain, discomfort, or fever as directed by your caregiver. Do not use aspirin or blood thinners if a biopsy was taken. Consult your caregiver for medicine usage if biopsies were taken. SEEK IMMEDIATE MEDICAL CARE IF:   You have a fever.  You pass large blood clots or fill a toilet with blood following the procedure. This may also occur 10 to 14 days following the procedure. This is more likely if a biopsy was taken.  You develop abdominal pain that keeps getting worse and cannot be relieved with medicine. Document Released: 05/22/2000 Document Revised: 08/17/2011 Document Reviewed: 01/05/2008 [facility] Patient Information 2013 [place], [place]. 30 PM .

## 2012-03-11 LAB — RETICULIN ANTIBODIES, IGA W TITER: Reticulin Ab, IgA: NEGATIVE

## 2012-03-11 LAB — GLIADIN ANTIBODIES, SERUM: Gliadin IgG: 21.3 U/mL — ABNORMAL HIGH (ref <20)

## 2012-03-11 NOTE — Progress Notes (Signed)
Reviewed and agree with management. Violette Morneault D. Gabriellah Rabel, M.D., FACG  

## 2012-05-03 ENCOUNTER — Encounter: Payer: Medicare Other | Admitting: Internal Medicine

## 2012-05-13 ENCOUNTER — Telehealth: Payer: Self-pay | Admitting: Internal Medicine

## 2012-05-13 NOTE — Telephone Encounter (Signed)
Left a message for patient to call me. 

## 2012-05-13 NOTE — Telephone Encounter (Signed)
Spoke with patient and moved her procedure to 07/01/12 at 1:00/2:00 PM

## 2012-05-25 ENCOUNTER — Encounter: Payer: Medicare Other | Admitting: Internal Medicine

## 2012-06-20 ENCOUNTER — Ambulatory Visit (AMBULATORY_SURGERY_CENTER): Payer: Medicare Other | Admitting: *Deleted

## 2012-06-20 VITALS — Ht 65.0 in | Wt 158.6 lb

## 2012-06-20 DIAGNOSIS — R198 Other specified symptoms and signs involving the digestive system and abdomen: Secondary | ICD-10-CM

## 2012-06-20 DIAGNOSIS — R140 Abdominal distension (gaseous): Secondary | ICD-10-CM

## 2012-06-20 DIAGNOSIS — R194 Change in bowel habit: Secondary | ICD-10-CM

## 2012-06-20 DIAGNOSIS — R143 Flatulence: Secondary | ICD-10-CM

## 2012-06-20 DIAGNOSIS — R159 Full incontinence of feces: Secondary | ICD-10-CM

## 2012-06-20 MED ORDER — MOVIPREP 100 G PO SOLR: ORAL | Status: DC

## 2012-06-27 ENCOUNTER — Encounter: Payer: Self-pay | Admitting: *Deleted

## 2012-06-27 ENCOUNTER — Telehealth: Payer: Self-pay | Admitting: Internal Medicine

## 2012-06-27 NOTE — Telephone Encounter (Signed)
Error

## 2012-06-27 NOTE — Telephone Encounter (Signed)
Pt calling as she had some almonds and 1/2 tsp metamucil this morning on her cereal. Reassured her that her procedure would not be cancelled but not to have any additional almonds, nuts, etc or fiber supplements the rest of the week. We reviewed the rest of her instructions pertaining to diet. Pt states understanding.

## 2012-06-28 HISTORY — PX: OTHER SURGICAL HISTORY: SHX169

## 2012-07-01 ENCOUNTER — Encounter: Payer: Self-pay | Admitting: Internal Medicine

## 2012-07-01 ENCOUNTER — Ambulatory Visit (AMBULATORY_SURGERY_CENTER): Payer: Medicare Other | Admitting: Internal Medicine

## 2012-07-01 VITALS — BP 132/83 | HR 74 | Temp 98.6°F | Resp 16 | Ht 65.0 in | Wt 158.0 lb

## 2012-07-01 DIAGNOSIS — R198 Other specified symptoms and signs involving the digestive system and abdomen: Secondary | ICD-10-CM

## 2012-07-01 DIAGNOSIS — R159 Full incontinence of feces: Secondary | ICD-10-CM

## 2012-07-01 DIAGNOSIS — K6289 Other specified diseases of anus and rectum: Secondary | ICD-10-CM

## 2012-07-01 DIAGNOSIS — K623 Rectal prolapse: Secondary | ICD-10-CM

## 2012-07-01 DIAGNOSIS — D280 Benign neoplasm of vulva: Secondary | ICD-10-CM

## 2012-07-01 DIAGNOSIS — K5730 Diverticulosis of large intestine without perforation or abscess without bleeding: Secondary | ICD-10-CM

## 2012-07-01 DIAGNOSIS — K319 Disease of stomach and duodenum, unspecified: Secondary | ICD-10-CM

## 2012-07-01 DIAGNOSIS — K222 Esophageal obstruction: Secondary | ICD-10-CM

## 2012-07-01 HISTORY — DX: Esophageal obstruction: K22.2

## 2012-07-01 HISTORY — DX: Other specified diseases of anus and rectum: K62.89

## 2012-07-01 MED ORDER — DISPOSABLE ENEMA 19-7 GM/118ML RE ENEM: 1.0000 | ENEMA | Freq: Once | RECTAL | Status: DC

## 2012-07-01 MED ORDER — SODIUM CHLORIDE 0.9 % IV SOLN: 500.0000 mL | INTRAVENOUS | Status: DC

## 2012-07-01 MED ORDER — OMEPRAZOLE 20 MG PO CPDR: 20.0000 mg | DELAYED_RELEASE_CAPSULE | Freq: Every day | ORAL | Status: DC

## 2012-07-01 MED ORDER — MESALAMINE 1000 MG RE SUPP: 1000.0000 mg | Freq: Every day | RECTAL | Status: DC

## 2012-07-01 NOTE — Op Note (Addendum)
[facility] [address] [place], [address]   COLONOSCOPY PROCEDURE REPORT  PATIENT: Marilyn [name], Marilyn [name]  MR#: [id number] BIRTHDATE: [birth year] , 71  yrs. old GENDER: Female ENDOSCOPIST: Hart Carwin, MD REFERRED BY:  Murray Hodgkins, M.D. , Dr Konrad Dolores PROCEDURE DATE:  07/01/2012 PROCEDURE:   Colonoscopy with biopsy ASA CLASS:   Class II INDICATIONS:Rectal Bleeding, Change in bowel habits, and incontinence, s/p repair of enterocele at [facility], recurrent prolapse, last colon 2001, 2008, hx of incompetent rectal sphincter. MEDICATIONS: MAC sedation, administered by CRNA and Propofol (Diprivan) 140 mg IV  DESCRIPTION OF PROCEDURE:   After the risks and benefits and of the procedure were explained, informed consent was obtained.  A digital rectal exam revealed decreased sphincter tone.    The LB PCF-Q180AL [id number]  endoscope was introduced through the anus and advanced to the cecum, which was identified by both the appendix and ileocecal valve .  The quality of the prep was Moviprep fair .  The instrument was then slowly withdrawn as the colon was fully examined.     COLON FINDINGS: There was severe diverticulosis noted throughout the entire examined colon with associated tortuosity, luminal narrowing, colonic spasm and angulation.   Patch of erythema in the rectal ampulla c/w prolase, biopsies taken.     Retroflexed views revealed no abnormalities.     The scope was then withdrawn from the patient and the procedure completed.  COMPLICATIONS: There were no complications. ENDOSCOPIC IMPRESSION: 1.   There was severe diverticulosis noted throughout the entire examined colon 2.   Patch of erythema in the rectal ampulla c/w prolase, biopsies taken 3. incompetent rectal sphincter, s/p remote enterocele repair, ?? recurrence?  RECOMMENDATIONS: Await pathology results Canase supp 1000mg  hs, may benefit from vaginal pessary   REPEAT EXAM: In 10 year(s)  for  Colonoscopy.  cc:  _______________________________ eSignedHart Carwin, MD 07/01/2012 3:44 PM Revised: 07/01/2012 3:44 PM    PATIENT NAME:  [name], [name] MR#: [id number]

## 2012-07-01 NOTE — Progress Notes (Signed)
Called to room to assist during endoscopic procedure.  Patient ID and intended procedure confirmed with present staff. Received instructions for my participation in the procedure from the performing physician.  

## 2012-07-01 NOTE — Progress Notes (Signed)
Patient did not experience any of the following events: a burn prior to discharge; a fall within the facility; wrong site/side/patient/procedure/implant event; or a hospital transfer or hospital admission upon discharge from the facility. (G8907)Patient did not have preoperative order for IV antibiotic SSI prophylaxis. (G8918) ewm 

## 2012-07-01 NOTE — Patient Instructions (Addendum)
YOU HAD AN ENDOSCOPIC PROCEDURE TODAY AT THE  ENDOSCOPY CENTER: Refer to the procedure report that was given to you for any specific questions about what was found during the examination.  If the procedure report does not answer your questions, please call your gastroenterologist to clarify.  If you requested that your care partner not be given the details of your procedure findings, then the procedure report has been included in a sealed envelope for you to review at your convenience later.  YOU SHOULD EXPECT: Some feelings of bloating in the abdomen. Passage of more gas than usual.  Walking can help get rid of the air that was put into your GI tract during the procedure and reduce the bloating. If you had a lower endoscopy (such as a colonoscopy or flexible sigmoidoscopy) you may notice spotting of blood in your stool or on the toilet paper. If you underwent a bowel prep for your procedure, then you may not have a normal bowel movement for a few days.  DIET:   Drink plenty of fluids but you should avoid alcoholic beverages for 24 hours.dilation diet today as directed. Please resume regular diet tomorrow.   ACTIVITY: Your care partner should take you home directly after the procedure.  You should plan to take it easy, moving slowly for the rest of the day.  You can resume normal activity the day after the procedure however you should NOT DRIVE or use heavy machinery for 24 hours (because of the sedation medicines used during the test).    SYMPTOMS TO REPORT IMMEDIATELY: A gastroenterologist can be reached at any hour.  During normal business hours, 8:30 AM to 5:00 PM Monday through Friday, call [phone].  After hours and on weekends, please call the GI answering service at [phone](emergency number) who will take a message and have the physician on call contact you.   Following lower endoscopy (colonoscopy or flexible sigmoidoscopy):  Excessive amounts of blood in the  stool  Significant tenderness or worsening of abdominal pains  Swelling of the abdomen that is new, acute  Fever of 100F or higher  Following upper endoscopy (EGD)  Vomiting of blood or coffee ground material  New chest pain or pain under the shoulder blades  Painful or persistently difficult swallowing  New shortness of breath  Fever of 100F or higher  Black, tarry-looking stools  FOLLOW UP: If any biopsies were taken you will be contacted by phone or by letter within the next 1-3 weeks.  Call your gastroenterologist if you have not heard about the biopsies in 3 weeks.  Our staff will call the home number listed on your records the next business day following your procedure to check on you and address any questions or concerns that you may have at that time regarding the information given to you following your procedure. This is a courtesy call and so if there is no answer at the home number and we have not heard from you through the emergency physician on call, we will assume that you have returned to your regular daily activities without incident.  SIGNATURES/CONFIDENTIALITY: You and/or your care partner have signed paperwork which will be entered into your electronic medical record.  These signatures attest to the fact that that the information above on your After Visit Summary has been reviewed and is understood.  Full responsibility of the confidentiality of this discharge information lies with you and/or your care-partner.   Handout on dilation diet, gastritis,stricture, hiatal hernia, diverticulosis, high  fiber diet,gerd handout Please follow dilation diet today. Ordered you omeprazole 20 mg daily at dinner. Please take this 20-30 minutes before your dinner daily. Also dr Juanda Chance ordered canasa suppositories. You will receive a prescription for this today.

## 2012-07-01 NOTE — Progress Notes (Signed)
Pt. Had cataract surgery with lens implant  right eye on 06/28/12.  Would like to wear glasses during procedure to protect eye, as She does not have her shield.

## 2012-07-01 NOTE — Op Note (Signed)
[facility] [address] [place], [address]   ENDOSCOPY PROCEDURE REPORT  PATIENT: Marilyn, [name]  MR#: [id number] BIRTHDATE: 03/30/1941 , 71  yrs. old GENDER: Female ENDOSCOPIST: Hart Carwin, MD REFERRED BY:  Murray Hodgkins, M.D. PROCEDURE DATE:  07/01/2012 PROCEDURE:  EGD w/ biopsy and Savary dilation of esophagus ASA CLASS:     Class II INDICATIONS:  Heartburn.   Dysphagia.   positive celiac disease antibody. MEDICATIONS: MAC sedation, administered by CRNA, propofol (Diprivan) 200mg  IV, and Robinul 0.2 mg IV TOPICAL ANESTHETIC: Cetacaine Spray  DESCRIPTION OF PROCEDURE: After the risks benefits and alternatives of the procedure were thoroughly explained, informed consent was obtained.  The LB GIF-H180 [id number] endoscope was introduced through the mouth and advanced to the second portion of the duodenum. Without limitations.  The instrument was slowly withdrawn as the mucosa was fully examined.        ESOPHAGUS: A stricture was found at the gastroesophageal junction. The stenosis was traversable with the endoscope.  A biopsy was performed using cold forceps.  Sample sent for histology. 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     The scope was then withdrawn from the patient and the procedure completed.  COMPLICATIONS: There were no complications. ENDOSCOPIC IMPRESSION: Stricture was found at the gastroesophageal junction; biopsy taken,, s/p dilation to 16 mm with Savary dilators deformed gastric antrum likely due to chronic peptic ulcer disease, r/o H.Pylori gastritis s/p small bowl biopsies to r/o  sprue  RECOMMENDATIONS: 1.  Await pathology results 2.  Continue PPI 3.  Anti-reflux regimen to be follow  REPEAT EXAM:  eSigned:  Hart Carwin, MD 07/01/2012 3:21 PM   CC:  PATIENT NAME:  Marilyn, [name] MR#: [id number]

## 2012-07-01 NOTE — Progress Notes (Signed)
Pt. Received fleets enema because she noted brown liquid  With fecal particles after completing movi prep.  Results light brown Liquid, no feces observed.    Pt. Notes some type of protrusion from anal area.

## 2012-07-04 ENCOUNTER — Telehealth: Payer: Self-pay | Admitting: Internal Medicine

## 2012-07-04 ENCOUNTER — Telehealth: Payer: Self-pay

## 2012-07-04 NOTE — Telephone Encounter (Signed)
  Follow up Call-  Call back number 07/01/2012  Post procedure Call Back phone  # [phone]  Permission to leave phone message Yes     Patient questions:  Do you have a fever, pain , or abdominal swelling? no Pain Score  0 *  Have you tolerated food without any problems? yes  Have you been able to return to your normal activities? yes  Do you have any questions about your discharge instructions: Diet   no Medications  no Follow up visit  no  Do you have questions or concerns about your Care? no  Actions: * If pain score is 4 or above: No action needed, pain <4.

## 2012-07-04 NOTE — Telephone Encounter (Signed)
Patient calling with several questions about medications she it to take. Reviewed medications and answered questions.  She has many questions about her diet also. She wants to schedule OV to discuss. Scheduled patient on 07/19/12 at 2:00 PM.She is also concerned about her ability to taste foods. She states that after her procedures, she has not been able to taste her foods. Please, advise

## 2012-07-06 ENCOUNTER — Encounter: Payer: Self-pay | Admitting: Internal Medicine

## 2012-07-06 NOTE — Telephone Encounter (Signed)
I can see her this afternoon if she can come.

## 2012-07-06 NOTE — Telephone Encounter (Signed)
Spoke with patient and she wants to keep her appointment in Feb. She has cataracts and is afraid to drive today.

## 2012-07-13 ENCOUNTER — Telehealth: Payer: Self-pay | Admitting: Internal Medicine

## 2012-07-13 NOTE — Telephone Encounter (Signed)
Patient states since her EGD she cannot taste her food. States she can tell texture to her foods but nothing tastes like it is suppose too. Please, advise.

## 2012-07-13 NOTE — Telephone Encounter (Signed)
I have reviewed  The EGD report. She underwent esophageal dilation.She may be having sideffect from ? Cetacaine srpay?  May be she has more reflux now. You can call her in [place] mouthwash, swish and swallow tid x 3 days.

## 2012-07-14 ENCOUNTER — Encounter: Payer: Self-pay | Admitting: *Deleted

## 2012-07-14 MED ORDER — MAGIC MOUTHWASH: ORAL | Status: DC

## 2012-07-14 NOTE — Telephone Encounter (Signed)
Patient notified of Dr. Regino Schultze recommendation. Rx sent to pharmacy

## 2012-07-18 ENCOUNTER — Encounter: Payer: Self-pay | Admitting: *Deleted

## 2012-07-19 ENCOUNTER — Encounter: Payer: Self-pay | Admitting: Internal Medicine

## 2012-07-19 ENCOUNTER — Ambulatory Visit (INDEPENDENT_AMBULATORY_CARE_PROVIDER_SITE_OTHER): Payer: Medicare Other | Admitting: Internal Medicine

## 2012-07-19 VITALS — BP 128/72 | HR 62 | Ht 65.0 in | Wt 156.8 lb

## 2012-07-19 DIAGNOSIS — K222 Esophageal obstruction: Secondary | ICD-10-CM

## 2012-07-19 DIAGNOSIS — K5730 Diverticulosis of large intestine without perforation or abscess without bleeding: Secondary | ICD-10-CM

## 2012-07-19 DIAGNOSIS — K219 Gastro-esophageal reflux disease without esophagitis: Secondary | ICD-10-CM

## 2012-07-19 NOTE — Progress Notes (Signed)
Marilyn [name] 02/17/1941 MRN [id number]   History of Present Illness:  This is a 72 year old white female who is post upper endoscopy and colonoscopy who came to discuss the findings and the fact that she her taste has been substantially decreased since the endoscopy. She was found to have a benign distal esophagus stricture and 4 cm nonreducible hiatal hernia as well as mild gastritis which was H. pylori negative. She was dilated to 17 mm. She denies any dysphagia at the present time. She has been taking Magic mouthwash for the past 3 days with some improvement of her taste. As far as her colonoscopy is concerned, she had severe sigmoid diverticulosis with a partial obstruction and narrowing of the sigmoid colon. There was a patch of rectal mucosa which was intensely erythematous and was thought to represent partial prolapse but the biopsies were not diagnostic. She had a prior repair of her enterocele. She is on Prilosec 20 mg daily.   Past Medical History  Diagnosis Date  . Hypertension   . Breast cancer     stage 1; right  . Migraine   . Diverticulosis   . Hypertension   . Cataracts, bilateral   . IBS (irritable bowel syndrome)   . Hernia   . Esophageal stricture    Past Surgical History  Procedure Laterality Date  . Hernia repair    . Appendectomy    . Eye surgery    . Oophorectomy    . Breast surgery      right lumpectomy, snbx, apbi  . Enterocele repair    . Breast lumpectomy    . Cataract extraction  06/29/11    right eye with lens implant    reports that she has never smoked. She has never used smokeless tobacco. She reports that she does not drink alcohol or use illicit drugs. family history includes Alzheimer's disease in her father; Breast cancer in her maternal aunt; Hypertension in her mother; and Stroke in her mother.  There is no history of Colon cancer. No Known Allergies      Review of Systems:  The remainder of the 10 point ROS is negative except as  outlined in H&P   Physical Exam: Negative for dysphagia abdominal pain diarrhea General appearance  Well developed, in no distress. Psychological normal mood and affect.  Assessment and Plan:  Problem #53 72 year old white female with mild esophageal stricture which was dilated. She has a 4 cm hiatal hernia and gastroesophageal reflux. We have discussed antireflux measures and Prilosec 20 mg daily. Her loss of taste may be related to Cetacaine spray or possibly due to change in the oral bacterial floramouth f which should be improved on Magic mouthwash.  Problem #2 Colorectal screening. Severe diverticulosis of the sigmoid colon. She has a history of a diverticular bleed but is currently asymptomatic. She will continue on a high-fiber diet and Canasa suppositories when necessary for rectal irritation.   07/19/2012 Lina Sar

## 2012-07-19 NOTE — Patient Instructions (Addendum)
Follow up as needed CC: Dr Murray Hodgkins

## 2012-08-17 ENCOUNTER — Encounter (INDEPENDENT_AMBULATORY_CARE_PROVIDER_SITE_OTHER): Payer: Self-pay | Admitting: General Surgery

## 2012-09-08 ENCOUNTER — Telehealth: Payer: Self-pay | Admitting: Internal Medicine

## 2012-09-08 NOTE — Telephone Encounter (Signed)
Line still busy.

## 2012-09-08 NOTE — Telephone Encounter (Signed)
Line busy - will try again later

## 2012-09-08 NOTE — Telephone Encounter (Signed)
Line busy

## 2012-09-09 NOTE — Telephone Encounter (Signed)
Patient left a message to call her. Attempted to call her but line is busy.

## 2012-09-09 NOTE — Telephone Encounter (Signed)
Left a message for patient to call me. 

## 2012-09-12 MED ORDER — PANTOPRAZOLE SODIUM 40 MG PO TBEC: 40.0000 mg | DELAYED_RELEASE_TABLET | Freq: Every day | ORAL | Status: DC

## 2012-09-12 NOTE — Telephone Encounter (Signed)
Patient states she does not think the Prilosec daily has improved her symptoms. She is still having belching and burping after she eats. Wondering if she should change to another to medication. Please, advise.

## 2012-09-12 NOTE — Telephone Encounter (Signed)
Spoke with patient and told her Rx has been sent.

## 2012-09-12 NOTE — Telephone Encounter (Signed)
Please switch to Protonix/Pantoprazole 40 mg, #30 1 po qd,1 refill

## 2012-09-12 NOTE — Telephone Encounter (Signed)
rx sent to pharmacy

## 2012-09-27 ENCOUNTER — Encounter (INDEPENDENT_AMBULATORY_CARE_PROVIDER_SITE_OTHER): Payer: Self-pay | Admitting: General Surgery

## 2012-09-27 ENCOUNTER — Ambulatory Visit (INDEPENDENT_AMBULATORY_CARE_PROVIDER_SITE_OTHER): Payer: 59 | Admitting: General Surgery

## 2012-09-27 VITALS — BP 120/70 | HR 68 | Resp 18 | Ht 65.0 in | Wt 156.0 lb

## 2012-09-27 DIAGNOSIS — C50919 Malignant neoplasm of unspecified site of unspecified female breast: Secondary | ICD-10-CM

## 2012-09-27 DIAGNOSIS — C50911 Malignant neoplasm of unspecified site of right female breast: Secondary | ICD-10-CM

## 2012-09-27 NOTE — Progress Notes (Signed)
Subjective:     Patient ID: Marilyn [name], female   DOB: [birth year], 72 y.o.   MRN: [id number]  HPI 76 yof s/p right lumpectomy/snbx for stage 1 rogjt breast cancer in March of 2011. She underwent MammoSite radiation therapy followed by tamoxifen.  She has had a persistent seroma at her MammoSite location. She describes no real complaints referable to her breasts at all. She has no complaints of any lymphedema in her right arm. She had a mammogram in January of this year and this was recommended for followup in one year. She has had a diagnosis of reflux and a stricture recently. She has been making some lifestyle changes due to that. She's not really doing any of her own exams at this point. She comes in today for followup. She's otherwise had no real events recently. She has a lot of questions about the medication she's on it whatsoever the interactions might be. She is tolerating her tamoxifen well and thinks that her hot flashes and actually been subsiding.  Review of Systems     Objective:   Physical Exam  Vitals reviewed. Constitutional: She appears well-developed and well-nourished.  Neck: Neck supple.  Pulmonary/Chest: Right breast exhibits no inverted nipple, no nipple discharge, no skin change and no tenderness. Left breast exhibits no inverted nipple, no mass, no nipple discharge, no skin change and no tenderness. Breasts are symmetrical.    Lymphadenopathy:    She has no cervical adenopathy.       Assessment:     History stage I right breast cancer    Plan:     She has no clinical evidence of recurrence. We discussed her own exams. She is going to repeat her mammogram next January. She is going to discuss with Dr. Chilton Si her primary care physician all her medications as well as the reactions. She will see Dr. Bevelyn Buckles in 6 months I will see her back in one year. We discussed her seroma and this likely will be present for the long-term due to the MammoSite. She would also like to  see the cancer center dietician.

## 2012-09-27 NOTE — Patient Instructions (Signed)

## 2012-10-07 ENCOUNTER — Telehealth: Payer: Self-pay | Admitting: Internal Medicine

## 2012-10-07 NOTE — Telephone Encounter (Signed)
Pt with f/u on 07/19/12 post ECL 07/01/12 with stricture dilation. She reported a lack of taste post EGD and she was started on Omeprazole. She called in 09/08/12 to report no improvement with the burping and belching after her meals and was switched to pantoprazole. Today she has 5 capsules left, but again, she sees no real improvement with the med. She still has burping and belching after she eats and when she bends over after a meal, food comes back up. She has started another new med, AVODART for thinning hair per [facility]. Do you want to change to another PPI; pt is willing? Thanks.

## 2012-10-08 NOTE — Telephone Encounter (Signed)
Please start her on Pepcid 40 mg 1 po qd, #30, 3 refills.Her lack of taste has nothing to do with EGD,

## 2012-10-10 MED ORDER — FAMOTIDINE 40 MG PO TABS: 40.0000 mg | ORAL_TABLET | Freq: Every day | ORAL | Status: DC

## 2012-10-10 NOTE — Telephone Encounter (Signed)
Spoke with patient and gave her Dr. Brodie's recommendation. Rx sent. 

## 2012-10-11 ENCOUNTER — Encounter: Payer: Self-pay | Admitting: *Deleted

## 2012-10-11 ENCOUNTER — Telehealth: Payer: Self-pay | Admitting: *Deleted

## 2012-10-11 NOTE — Telephone Encounter (Signed)
sw pt gv appt d/t for 10/20/12 @ 4:45pm to see [facility]...td

## 2012-10-19 ENCOUNTER — Encounter: Payer: Medicare Other | Admitting: Nutrition

## 2012-10-20 ENCOUNTER — Encounter: Payer: Medicare Other | Admitting: Nutrition

## 2012-10-27 ENCOUNTER — Ambulatory Visit: Payer: Medicare Other | Admitting: Nutrition

## 2012-10-27 NOTE — Progress Notes (Signed)
Patient requesting information on appropriate foods for diverticulosis and GERD. She would like to continue to try to consume a healthy diet to minimize breast cancer recurrence and for her diet to be one that is helpful for weight maintenance.  Nutrition diagnosis:  Food and nutrition related knowledge deficit related to diverticulosis and GERD as evidenced by no prior need for nutrition related information.  Intervention: I educated patient on the importance of choosing higher fiber foods. I have reviewed food she may want to eliminate from her diet to reduce GERD. I briefly reviewed the importance of a plant-based diet for prevention of breast cancer recurrence. I provided fact sheets for her to take. Teach back method used. Patient has my contact information for questions.  Monitoring, evaluation, goals: Patient will tolerate a healthy diet to minimize side effects of GERD and diverticulitis.  Next visit: There is no followup scheduled.

## 2012-11-11 ENCOUNTER — Other Ambulatory Visit: Payer: Medicare Other

## 2012-11-11 ENCOUNTER — Other Ambulatory Visit: Payer: Self-pay | Admitting: *Deleted

## 2012-11-11 DIAGNOSIS — I10 Essential (primary) hypertension: Secondary | ICD-10-CM

## 2012-11-11 DIAGNOSIS — E785 Hyperlipidemia, unspecified: Secondary | ICD-10-CM

## 2012-11-11 DIAGNOSIS — E039 Hypothyroidism, unspecified: Secondary | ICD-10-CM

## 2012-11-12 LAB — TSH: TSH: 5.12 u[IU]/mL — ABNORMAL HIGH (ref 0.450–4.500)

## 2012-11-12 LAB — LIPID PANEL
HDL: 58 mg/dL (ref >39)
Triglycerides: 87 mg/dL (ref 0–149)

## 2012-11-12 LAB — COMPREHENSIVE METABOLIC PANEL
Albumin: 4.5 g/dL (ref 3.5–4.8)
Alkaline Phosphatase: 51 IU/L (ref 39–117)
BUN: 19 mg/dL (ref 8–27)
CO2: 26 mmol/L (ref 19–28)
Calcium: 9.7 mg/dL (ref 8.6–10.2)
Chloride: 102 mmol/L (ref 97–108)
Glucose: 84 mg/dL (ref 65–99)
Total Protein: 6.5 g/dL (ref 6.0–8.5)

## 2012-11-14 ENCOUNTER — Encounter: Payer: Self-pay | Admitting: Geriatric Medicine

## 2012-11-15 ENCOUNTER — Encounter: Payer: Self-pay | Admitting: Internal Medicine

## 2012-11-15 ENCOUNTER — Ambulatory Visit (INDEPENDENT_AMBULATORY_CARE_PROVIDER_SITE_OTHER): Payer: Medicare Other | Admitting: Internal Medicine

## 2012-11-15 ENCOUNTER — Other Ambulatory Visit: Payer: Self-pay | Admitting: *Deleted

## 2012-11-15 VITALS — BP 130/82 | HR 69 | Temp 98.1°F | Resp 14 | Ht 65.0 in | Wt 154.0 lb

## 2012-11-15 DIAGNOSIS — K222 Esophageal obstruction: Secondary | ICD-10-CM

## 2012-11-15 DIAGNOSIS — I10 Essential (primary) hypertension: Secondary | ICD-10-CM

## 2012-11-15 DIAGNOSIS — K6289 Other specified diseases of anus and rectum: Secondary | ICD-10-CM

## 2012-11-15 DIAGNOSIS — K209 Esophagitis, unspecified without bleeding: Secondary | ICD-10-CM | POA: Insufficient documentation

## 2012-11-15 DIAGNOSIS — E039 Hypothyroidism, unspecified: Secondary | ICD-10-CM

## 2012-11-15 DIAGNOSIS — E785 Hyperlipidemia, unspecified: Secondary | ICD-10-CM

## 2012-11-15 NOTE — Progress Notes (Signed)
Date: 11/15/2012  MRN:  [id number] Name:  Marilyn [name] Sex:  female Age:  72 y.o. DOB:03/24/1941   [facility] #:  [id number]               Provider: Murray Hodgkins M.D.  Emergency Contacts: Contact Information   Name Relation Home Work Mobile   [place] [phone]        Code Status: Living will  Allergies:No Known Allergies   Chief Complaint  Patient presents with  . Annual Exam    redness on feet, swelling in ankles, and catch in groin     HPI: Some redness in the feet. No tingling or burning.  Edema in feet occurred at tthe time of her trip to [place]. They still seem a little swollen. Acid reflux with esophageal scars and stricture seen on EGD 07/01/12. Diverticulosis noted on colonoscopy 07/01/12.  Past Medical History  Diagnosis Date  . Hypertension   . Breast cancer     stage 1; right  . Migraine   . Diverticulosis   . Hypertension   . Cataracts, bilateral   . IBS (irritable bowel syndrome)   . Hernia   . Esophageal stricture   . Reflux esophagitis   . Senile osteoporosis   . Lumbago   . Spasm of muscle   . Abnormality of gait   . Unspecified hypothyroidism   . Altered mental status   . Alopecia, unspecified   . Obsessive-compulsive personality disorder   . Mixed hearing loss, bilateral   . Chest pain, unspecified   . Ganglion of tendon sheath   . Encounter for long-term (current) use of other medications   . Other and unspecified hyperlipidemia   . Migraine with aura, without mention of intractable migraine without mention of status migrainosus   . Unspecified constipation   . Osteoarthrosis, unspecified whether generalized or localized, unspecified site   . Plantar fascial fibromatosis   . Rectocele   . Irritable bowel syndrome   . Regional enteritis of small intestine   . Esophageal stricture 07/01/12  . Diverticulosis 07/01/12  . Decreased rectal sphincter tone 07/01/12    Past Surgical History  Procedure Laterality Date  . Hernia  repair    . Appendectomy    . Eye surgery    . Oophorectomy    . Enterocele repair    . Cataract extraction  06/29/11    right eye with lens implant  . Breast surgery      right lumpectomy, snbx, apbi  . Tonsillectomy    . Basal cell carcinoma excision  1978    neck  . Dilation and curettage of uterus    . Hair transplant  2008  . Dermatolfibroma  2012  . Breast lumpectomy  2011    right  . Cataract removal od  2014  . Cataract removal os  2014     Procedures: 1982- Mammograms Dr. Yolanda Bonine 2001- Colonoscopy Dr. Lina Sar 1998- Sigmoidoscopy Dr. Judie Grieve 2002- Bone density test Dr. Yolanda Bonine 2003- Heel test Dr. Jennette Kettle              Vaginal [place] Dr. Jennette Kettle Sept 2006 Mammogram--Normal Nov 2006 Pap smear--Normal. 04/2007- Mammogram negative 06/17/2009-Mammogram: Right CC exaggerated view and right mediolateral oblique magnification view are recommended. Right mediolateral view is recommended. 06/19/09 Mammogram: suspicious area of microcalcification in right breast. Biopsy showed invasive mammary cancer and high grade ductal cancer. 07/04/2009-Carotid Doppler: Normal 07/04/2009-2D Echo: The left ventricular cavity is small. There is mild concentric left ventricular hypertrophy.  Left ventricular systolic function is normal. Ejection Fraction = >55%. The transmitral spectral doppler flow pattern is suggestive of impaired LV relaxation. The LA is mildly dilated. There is mild mitral annular calcification. Right ventricular systolic pressure is normal. The aortic valve appears to be mildly sclerotic. No aortic stenosis. Mild to moderate aortic regurgitation.-----Dr. Alanda Amass 07/26/2009-MRI Breast: No abnormal enhancement is seen in this patient with known right breast cancer. Treatment planning is recommended. 03/11/2010- Mammogram-negative 03/11/2010 Right Breast Ultrasound- At the lumpectomy site there is mixed density but primarily anechoic region at the site of lumpectomy and mammosite placement.  In its entirety the area measures approx. 3.5cm. 06/30/2009- Mammogram; negative 06/30/2011 Ultrasound of the Right Breast Follow up in six months  07/01/12 EGD: Esophageal stricture 07/01/12 colonoscopy: Diverticulosis  Consultants: Dr. Konrad Dolores -GYN Dr Verne Carrow- Ophthalmology  Dr. Lina Sar- GI Dr. Linton Rump McMichael/ Randel Books -Dermatology [facility] Dr. Darnelle Catalan- Oncologist Dr. Chipper Herb Radiation Oncology Emelia Loron: Gen Surg Dr. Lomax-Dermatologist Dr. Matthew Folks Dr. Francella Solian   Current Outpatient Prescriptions  Medication Sig Dispense Refill  . acetaminophen (TYLENOL) 500 MG tablet Take 500 mg by mouth every 6 (six) hours as needed.      [name] alendronate (FOSAMAX) 70 MG tablet Take 1 tablet (70 mg total) by mouth every 7 (seven) days. Take with a full glass of water on an empty stomach.  15 tablet  6  . AMLODIPINE BESYLATE PO Take 10 mg by mouth daily.       [name] aspirin 81 MG tablet Take 81 mg by mouth daily.      . AVODART 0.5 MG capsule       . Calcium Carbonate-Vitamin D (CALCIUM 600 + D PO) Take 1 tablet by mouth daily.      . Calcium Polycarbophil ([employer] FIBER LAXATIVE PO) Take 2 tablets by mouth daily.      . cholecalciferol (VITAMIN D) 1000 UNITS tablet Take 1,000 Units by mouth daily.      [name] docusate sodium (COLACE) 100 MG capsule Take 100 mg by mouth daily.      . famotidine (PEPCID) 40 MG tablet Take 1 tablet (40 mg total) by mouth daily.  30 tablet  3  . FLUTAMIDE PO Take 250 mg by mouth daily.       . Multiple Vitamins-Minerals (MULTIVITAMIN & MINERAL PO) Take 1 tablet by mouth daily.      . prednisoLONE acetate (PRED FORTE) 1 % ophthalmic suspension       . PROLENSA 0.07 % SOLN       . Selenium 200 MCG CAPS Take 1 capsule by mouth daily.      . tamoxifen (NOLVADEX) 20 MG tablet Take 1 tablet (20 mg total) by mouth daily.  90 tablet  12  . vitamin C (ASCORBIC ACID) 500 MG tablet Take 500 mg by mouth daily.      . vitamin E 400 UNIT capsule Take 400  Units by mouth daily.      . Wheat Dextrin (BENEFIBER) POWD Take 1 each by mouth daily.       No current facility-administered medications for this visit.    Immunization History  Administered Date(s) Administered  . DTaP 06/08/1996  . Pneumococcal-Generic 05/07/2010  . Zoster 11/30/2006     Diet: regular  History  Substance Use Topics  . Smoking status: Never Smoker   . Smokeless tobacco: Never Used  . Alcohol Use: No    Family History  Problem Relation Age of Onset  .  Breast cancer Maternal Aunt   . Hypertension Mother   . Stroke Mother   . Alzheimer's disease Father   . Colon cancer Neg Hx   . Hypertension Brother     Review of systems DATA OBTAINED: from patient GENERAL: Feels well   No fevers, fatigue, change in appetite or weight SKIN: No itch, rash or open wounds EYES: No eye pain, dryness or itching  No change in vision EARS: No earache, tinnitus, change in hearing NOSE: No congestion, drainage or bleeding MOUTH/THROAT: No mouth or tooth pain  No sore throat No difficulty chewing or swallowing RESPIRATORY: No cough, wheezing, SOB CARDIAC: No chest pain, palpitations  No edema. CHEST/BREASTS: No discomfort, discharge or lumps in breasts GI: No abdominal pain  No N/V/D or constipation  No heartburn or reflux  GU: No dysuria, frequency or urgency  No change in urine volume or character No nocturia or change in stream   MUSCULOSKELETAL: No joint pain, swelling or stiffness  No back pain  No muscle ache, pain, weakness  Gait is steady  No recent falls.  NEUROLOGIC: No dizziness, fainting, headache, imbalance, numbness  No change in mental status. Complains of some difficulty with balance. PSYCHIATRIC: No feelings of anxiety, depression Sleeps well.  No behavior issue. Chronic issues of excessive compulsive disorder and possible organ  Physical exam Vital signs: BP 130/82  Pulse 69  Temp(Src) 98.1 F (36.7 C) (Oral)  Resp 14  Ht 5\' 5"  (1.651 m)  Wt 154 lb  (69.854 kg)  BMI 25.63 kg/m2  SpO2 99%   General Appearance:    Alert, cooperative, no distress, appears less than stated age  Head:    Normocephalic, without obvious abnormality, atraumatic  Eyes:    PERRL, conjunctiva/corneas clear, EOM's intact, fundi    benign, both eyes. Corrective lenses.status post cataract extraction of the right eye. Patient has plans to have the cataract removed from the left eye next month.  Ears:    Normal TM's and external ear canals, both ears  Nose:   Nares normal, septum midline, mucosa normal, no drainage    or sinus tenderness  Throat:   Lips, mucosa, and tongue normal; teeth and gums normal  Neck:   Supple, symmetrical, trachea midline, no adenopathy;    thyroid:  no enlargement/tenderness/nodules; no carotid   bruit or JVD  Back:     Symmetric, no curvature, ROM normal, no CVA tenderness  Lungs:     Clear to auscultation bilaterally, respirations unlabored  Chest Wall:    No tenderness or deformity   Heart:    Regular rate and rhythm, S1 and S2 normal, no murmur, rub   or gallop  Breast Exam:    No tenderness, masses, or nipple abnormality  Abdomen:     Soft, non-tender, bowel sounds active all four quadrants,    no masses, no organomegaly  Genitalia:    done by Dr. Konrad Dolores   Rectal:    done by Dr. Konrad Dolores  Extremities:   Extremities normal, atraumatic, no cyanosis or edema  Pulses:   2+ and symmetric all extremities  Skin:   Skin color, texture, turgor normal, no rashes or lesions  Lymph nodes:   Cervical, supraclavicular, and axillary nodes normal  Neurologic:   CNII-XII intact, normal strength, sensation and reflexes    throughout      Screening Score  MMS    PHQ2 0  PHQ9     Fall Risk    BIMS  Appointment on 11/11/2012  Component Date Value Range Status  . Glucose 11/11/2012 84  65 - 99 mg/dL Final  . BUN 86/57/8469 19  8 - 27 mg/dL Final  . Creatinine, Ser 11/11/2012 0.74  0.57 - 1.00 mg/dL Final  . GFR calc non Af Amer  11/11/2012 81  >59 mL/min/1.73 Final  . GFR calc Af Amer 11/11/2012 94  >59 mL/min/1.73 Final  . BUN/Creatinine Ratio 11/11/2012 26  11 - 26 Final  . Sodium 11/11/2012 142  134 - 144 mmol/L Final  . Potassium 11/11/2012 4.2  3.5 - 5.2 mmol/L Final  . Chloride 11/11/2012 102  97 - 108 mmol/L Final  . CO2 11/11/2012 26  19 - 28 mmol/L Final   Comment: **Effective November 21, 2012, the reference interval for**                            Carbon Dioxide, Total will be changing to:                                                    0 - 30 days        15 - 27                                                 31 d - 5 months       15 - 26                                                  6 m - 11 months      15 - 25                                                    1 - 12 years       17 - 27                                                      > 12 years       18 - 29  . Calcium 11/11/2012 9.7  8.6 - 10.2 mg/dL Final  . Total Protein 11/11/2012 6.5  6.0 - 8.5 g/dL Final  . Albumin 62/95/2841 4.5  3.5 - 4.8 g/dL Final  . Globulin, Total 11/11/2012 2.0  1.5 - 4.5 g/dL Final  . Albumin/Globulin Ratio 11/11/2012 2.3  1.1 - 2.5 Final  . Total Bilirubin 11/11/2012 0.4  0.0 - 1.2 mg/dL Final  . Alkaline Phosphatase 11/11/2012 51  39 - 117 IU/L Final  . AST 11/11/2012 18  0 - 40 IU/L Final  . ALT 11/11/2012 12  0 - 32 IU/L Final  . TSH 11/11/2012 5.120* 0.450 - 4.500 uIU/mL Final  .  COMMENT 11/11/2012 Comment   Final   Comment: The Endocrine Society recommends against routine treatment for                          patients with elevated TSH levels below 10.000 uIU/mL if free T4 or                          T4 are normal.  Thyroid function should be monitored at 6 to 12                          month intervals. Women who are pregnant or hope to become pregnant                          in the near future deserve special consideration.  . Cholesterol, Total 11/11/2012 180  100 - 199 mg/dL Final  .  Triglycerides 11/11/2012 87  0 - 149 mg/dL Final  . HDL 16/03/9603 58  >39 mg/dL Final   Comment: According to ATP-III Guidelines, HDL-C >59 mg/dL is considered a                          negative risk factor for CHD.  [name] VLDL Cholesterol Cal 11/11/2012 17  5 - 40 mg/dL Final  . LDL Calculated 11/11/2012 540* 0 - 99 mg/dL Final  . Chol/HDL Ratio 11/11/2012 3.1  0.0 - 4.4 ratio units Final   11/15/12 EKG: Rate 58. Normal sinus rhythm. Normal EKG.  Annual summary: Hospitalizations: None in the last year Infection History: None of significance Functional assessment: Independent in all ADL Areas of potential improvement: None likely Rehabilitation Potential: Not pertinent Prognosis for survival: Good  Plan: Accelerated hypertension -   Controlled  Unspecified hypothyroidism : Controlled   Plan: TSH  Reflux esophagitis: Asymptomatic on current medications  Other and unspecified hyperlipidemia: Controlled  Esophageal stricture: Patient will need further followup with Dr. Juanda Chance.  Decreased rectal sphincter tone: Observe the time of colonoscopy. She denies fecal leakage.

## 2012-11-15 NOTE — Patient Instructions (Addendum)
Continue current medications.  Compression hose will likely help leg swelling.

## 2012-11-15 NOTE — Progress Notes (Signed)
Patient ID: Marilyn [name], female   DOB: 09/22/1940, 72 y.o.   MRN: [id number] Erythematous changes of the legs seem consistent with chronic venous insufficiency. She has a number of spider veins on legs. The red color I think is related to chronic venous stasis changes. Discussion regarding the etiology of this and the value of compression stockings ensued.  Patient is using a mattress addition that keeps her head higher than laying flat on the bed.

## 2012-11-22 ENCOUNTER — Telehealth: Payer: Self-pay | Admitting: *Deleted

## 2012-11-22 MED ORDER — ACETAMINOPHEN 500 MG PO TABS: ORAL_TABLET | ORAL | Status: DC

## 2012-11-22 NOTE — Telephone Encounter (Signed)
Patient called and stated that Dr. Chilton Si told her last week that she could take Tylenol ES Once every 6 hours for pain. It is not working and she is in pain and wants to know if she can increase it because she has been working out in [employer]. I spoke with Joseph Pierini and she stated that patient could take Tylenol ES Two tablets every 6 hours but no more than 6 tablets in a day. Spoke with patient and informed her and she agreed.

## 2012-12-08 ENCOUNTER — Telehealth: Payer: Self-pay | Admitting: Internal Medicine

## 2012-12-08 NOTE — Telephone Encounter (Signed)
Spoke with patient and gave her recommendations not to eat 3 hours prior to bed time and to elevate HOB 6-8 inches. Gave her the name of the spray used- Cetacaine. Mailed patient new echart activation code.

## 2012-12-13 HISTORY — PX: OTHER SURGICAL HISTORY: SHX169

## 2013-02-03 ENCOUNTER — Telehealth: Payer: Self-pay | Admitting: Internal Medicine

## 2013-02-03 MED ORDER — FAMOTIDINE 40 MG PO TABS: 40.0000 mg | ORAL_TABLET | Freq: Every day | ORAL | Status: DC

## 2013-02-03 NOTE — Telephone Encounter (Signed)
rx sent

## 2013-02-27 ENCOUNTER — Other Ambulatory Visit: Payer: Medicare Other

## 2013-02-27 DIAGNOSIS — E039 Hypothyroidism, unspecified: Secondary | ICD-10-CM

## 2013-02-28 LAB — TSH: TSH: 4.45 u[IU]/mL (ref 0.450–4.500)

## 2013-03-01 ENCOUNTER — Other Ambulatory Visit: Payer: Medicare Other | Admitting: Lab

## 2013-03-01 ENCOUNTER — Ambulatory Visit (INDEPENDENT_AMBULATORY_CARE_PROVIDER_SITE_OTHER): Payer: Medicare Other | Admitting: Internal Medicine

## 2013-03-01 ENCOUNTER — Encounter: Payer: Self-pay | Admitting: Internal Medicine

## 2013-03-01 VITALS — BP 138/76 | HR 72 | Temp 97.7°F | Ht 64.5 in | Wt 152.0 lb

## 2013-03-01 DIAGNOSIS — E039 Hypothyroidism, unspecified: Secondary | ICD-10-CM

## 2013-03-01 DIAGNOSIS — K222 Esophageal obstruction: Secondary | ICD-10-CM

## 2013-03-01 DIAGNOSIS — H532 Diplopia: Secondary | ICD-10-CM | POA: Insufficient documentation

## 2013-03-01 DIAGNOSIS — E785 Hyperlipidemia, unspecified: Secondary | ICD-10-CM

## 2013-03-01 DIAGNOSIS — Z23 Encounter for immunization: Secondary | ICD-10-CM

## 2013-03-01 DIAGNOSIS — K4090 Unilateral inguinal hernia, without obstruction or gangrene, not specified as recurrent: Secondary | ICD-10-CM

## 2013-03-01 DIAGNOSIS — I10 Essential (primary) hypertension: Secondary | ICD-10-CM

## 2013-03-01 MED ORDER — AMLODIPINE BESYLATE 10 MG PO TABS: ORAL_TABLET | ORAL | Status: DC

## 2013-03-01 NOTE — Progress Notes (Signed)
Subjective:    Patient ID: Marilyn [name], female    DOB: 1940/08/11, 72 y.o.   MRN: [id number]  HPI Exercises are helping the right groin pain.  Has noted nail dystrophy of the right thumbnail.  Still with reflux symptoms. Using a wedge and foam pillows. Has difficulty sleeping sometimes. She now thinks using Vitamin C at bedtime might be part of the problem.  Had cataract  Extraction OS in July 2014. Has had some diplopia since then.  Current Outpatient Prescriptions on File Prior to Visit  Medication Sig Dispense Refill  . alendronate (FOSAMAX) 70 MG tablet Take 1 tablet (70 mg total) by mouth every 7 (seven) days. Take with a full glass of water on an empty stomach.  15 tablet  6  . AMLODIPINE BESYLATE PO Take 10 mg by mouth daily.       [name] aspirin 81 MG tablet Take 81 mg by mouth daily.      . AVODART 0.5 MG capsule Take 0.5 mg by mouth once a week.       . Calcium Carbonate-Vitamin D (CALCIUM 600 + D PO) Take 1 tablet by mouth daily.      . Calcium Polycarbophil ([employer] FIBER LAXATIVE PO) Take 2 tablets by mouth daily.      . cholecalciferol (VITAMIN D) 1000 UNITS tablet Take 1,000 Units by mouth daily.      [name] docusate sodium (COLACE) 100 MG capsule Take 100 mg by mouth daily.      . famotidine (PEPCID) 40 MG tablet Take 1 tablet (40 mg total) by mouth daily.  90 tablet  1  . FLUTAMIDE PO Take 250 mg by mouth 2 (two) times daily.       . Multiple Vitamins-Minerals (MULTIVITAMIN & MINERAL PO) Take 1 tablet by mouth daily.      . Selenium 200 MCG CAPS Take 1 capsule by mouth daily.      . tamoxifen (NOLVADEX) 20 MG tablet Take 1 tablet (20 mg total) by mouth daily.  90 tablet  12  . vitamin C (ASCORBIC ACID) 500 MG tablet Take 500 mg by mouth daily.      . vitamin E 400 UNIT capsule Take 400 Units by mouth daily.      . Wheat Dextrin (BENEFIBER) POWD Take 1 each by mouth daily.       No current facility-administered medications on file prior to visit.    Review of Systems   Constitutional: Negative for fever, activity change, appetite change, fatigue and unexpected weight change.  HENT: Negative.   Eyes: Positive for visual disturbance (corrective lenses).  Cardiovascular: Negative.   Gastrointestinal: Negative.   Endocrine: Negative.   Genitourinary: Negative.   Musculoskeletal: Negative.   Skin: Negative.   Neurological:       Some difficulty with balance  Hematological: Negative.   Psychiatric/Behavioral:       Hx of mild obsessive and hoarding disorder       Objective:BP 138/76  Pulse 72  Temp(Src) 97.7 F (36.5 C) (Oral)  Ht 5' 4.5" (1.638 m)  Wt 152 lb (68.947 kg)  BMI 25.7 kg/m2  SpO2 98%    Physical Exam  Constitutional: She is oriented to person, place, and time. She appears well-developed and well-nourished. No distress.  HENT:  Head: Normocephalic and atraumatic.  Right Ear: External ear normal.  Left Ear: External ear normal.  Nose: Nose normal.  Mouth/Throat: Oropharynx is clear and moist.  Eyes: Conjunctivae and EOM are normal. Pupils are  equal, round, and reactive to light.  Mild pupil irregularity due to cataract extraction of the right eye  Neck: No JVD present. No tracheal deviation present. No thyromegaly present.  Cardiovascular: Normal rate, regular rhythm, normal heart sounds and intact distal pulses.  Exam reveals no gallop and no friction rub.   No murmur heard. Pulmonary/Chest: No respiratory distress. She has no wheezes. She has no rales.  Abdominal: She exhibits no distension and no mass. There is no tenderness.  Genitourinary:  Done by Dr. Konrad Dolores  Musculoskeletal: Normal range of motion. She exhibits no edema and no tenderness.  Lymphadenopathy:    She has no cervical adenopathy.  Neurological: She is alert and oriented to person, place, and time. She has normal reflexes. No cranial nerve deficit. Coordination normal.  Skin: No rash noted.  Psychiatric: She has a normal mood and affect. Her behavior is  normal. Thought content normal.   Appointment on 02/27/2013  Component Date Value Range Status  . TSH 02/27/2013 4.450  0.450 - 4.500 uIU/mL Final        Assessment & Plan:  1. HYPERTENSION controlled - amLODipine (NORVASC) 10 MG tablet; One daily to control BP  Dispense: 90 tablet; Refill: 3 - Comprehensive metabolic panel; Future  2. Unspecified hypothyroidism controlled - TSH; Future  3. Other and unspecified hyperlipidemia controlled - Lipid panel; Future  4. Diplopia Occurred post cataract extraction OS. Ophth is following  5. INGUINAL HERNIA discomfort is improved  6. Esophageal stricture Mild dysphagia.  7. Need for prophylactic vaccination and inoculation against influenza administered

## 2013-03-01 NOTE — Patient Instructions (Signed)
Continue current medications. 

## 2013-03-08 ENCOUNTER — Ambulatory Visit (HOSPITAL_BASED_OUTPATIENT_CLINIC_OR_DEPARTMENT_OTHER): Payer: Medicare Other | Admitting: Oncology

## 2013-03-08 ENCOUNTER — Telehealth: Payer: Self-pay | Admitting: Oncology

## 2013-03-08 VITALS — BP 134/72 | HR 81 | Temp 98.7°F | Resp 20 | Ht 64.5 in | Wt 153.3 lb

## 2013-03-08 DIAGNOSIS — C50411 Malignant neoplasm of upper-outer quadrant of right female breast: Secondary | ICD-10-CM | POA: Insufficient documentation

## 2013-03-08 DIAGNOSIS — M8580 Other specified disorders of bone density and structure, unspecified site: Secondary | ICD-10-CM

## 2013-03-08 DIAGNOSIS — C50419 Malignant neoplasm of upper-outer quadrant of unspecified female breast: Secondary | ICD-10-CM

## 2013-03-08 DIAGNOSIS — M899 Disorder of bone, unspecified: Secondary | ICD-10-CM

## 2013-03-08 DIAGNOSIS — C50911 Malignant neoplasm of unspecified site of right female breast: Secondary | ICD-10-CM

## 2013-03-08 MED ORDER — TAMOXIFEN CITRATE 20 MG PO TABS: 20.0000 mg | ORAL_TABLET | Freq: Every day | ORAL | Status: DC

## 2013-03-08 MED ORDER — ALENDRONATE SODIUM 70 MG PO TABS: 70.0000 mg | ORAL_TABLET | ORAL | Status: DC

## 2013-03-08 NOTE — Progress Notes (Signed)
ID: Jari Sportsman   DOB: [birth year]  MR#: [id number]  CSN#:[id number]  PCP: Kimber Relic, MD GYN:  SUSusy Frizzle. Dwain Sarna OTHER MD: Venancio Poisson, Audrie Lia   HISTORY OF PRESENT ILLNESS: Marilyn [name] had routine screening mammography June 17, 2009, showing dense breast tissue with new calcifications in the upper quadrant on the right breast.  She was recalled on January 12 for diagnostic right mammography and this suggested a benign area of calcification; however, posterior to these there was a set of new microcalcifications and biopsy was recommended.  This was performed on July 09, 2009, and showed ([id number]) an invasive ductal carcinoma with high-grade ductal carcinoma in situ.  The prognostic profile showed the tumor cells to be 93% estrogen receptor positive, 100% progesterone receptor positive, with a low proliferation marker at 11% and HER-2 negative with a ratio by CISH of 1.03.    With this information, the patient was referred to Dr Dwain Sarna and bilateral breast MRIs were obtained February 18.  There were only post-biopsy changes seen in the upper outer quadrant of the right breast.  The left breast and the rest of the scan were unremarkable.  With this information, Dr Dwain Sarna proceeded to left lumpectomy and sentinel lymph node sampling on August 19, 2009.  The final pathology here ([id number]) showed a 6 mm invasive ductal carcinoma, grade 1, with 0 of 4 sentinel lymph nodes involved.  Margins were negative and there was no evidence of lymphovascular invasion.    The patient was felt to be a good candidate for MammoSite radiation and she was referred to Dr Dayton Scrape.  After appropriate discussion, the patient agree to MammoSite radiation and this was performed between March 21 and August 30, 2009, the breast receiving 3400 cGy in 10 fractions. Her subsequent history is as detailed below.  INTERVAL HISTORY: Marilyn [name] returns today for followup of her breast cancer. The  interval history is unremarkable. She is tolerating the tamoxifen with no significant hot flashes or vaginal wetness issues. She is also tolerating the alendronate without any side effects.  REVIEW OF SYSTEMS: She was having some right groin pain associated with certain activities. She discuss this with her rehabilitation tach and learn some exercises that have taken care of the problem. She does have a little bit of low back pain which she notices more when she gets up from a sitting position that at any other time. It does seem to improve with activity. She gets short of breath when walking more than one flight of stairs, but no chest pain, pressure, or palpitations. She continues to have problems with her vision and particularly diplopia is an issue--she is wearing the right prism glasses to correct that and certainly today on exam there was no evidence of strabismus. She has mild urinary stress incontinence. Otherwise a detailed review of systems today was noncontributory  PAST MEDICAL HISTORY: Past Medical History  Diagnosis Date  . Hypertension   . Breast cancer     stage 1; right  . Migraine   . Diverticulosis   . Hypertension   . Cataracts, bilateral   . IBS (irritable bowel syndrome)   . Hernia   . Esophageal stricture   . Reflux esophagitis   . Senile osteoporosis   . Lumbago   . Spasm of muscle   . Abnormality of gait   . Unspecified hypothyroidism   . Altered mental status   . Alopecia, unspecified   . Obsessive-compulsive personality disorder   . Mixed hearing  loss, bilateral   . Chest pain, unspecified   . Ganglion of tendon sheath   . Encounter for long-term (current) use of other medications   . Other and unspecified hyperlipidemia   . Migraine with aura, without mention of intractable migraine without mention of status migrainosus   . Unspecified constipation   . Osteoarthrosis, unspecified whether generalized or localized, unspecified site   . Plantar fascial  fibromatosis   . Rectocele   . Irritable bowel syndrome   . Regional enteritis of small intestine   . Esophageal stricture 07/01/12  . Diverticulosis 07/01/12  . Decreased rectal sphincter tone 07/01/12  1. Hypertension. 2. History of premature atrial arrhythmias. 3. History of Raynaud syndrome. 4. History of irritable bowel syndrome. 5. Status post tonsillectomy. 6. Status post inguinal hernia repair. 7. Status post strabismus repair. 8. Status post appendectomy. 9. Status post basal cell skin cancer removal in 1978. 10. History of D&C in 2003. 11. History of enterocele repair at [facility] with bilateral oophorectomies performed at the same time (1985). 12. Status post removal of a benign breast cyst. Status post remote history of migraines  PAST SURGICAL HISTORY: Past Surgical History  Procedure Laterality Date  . Hernia repair    . Appendectomy    . Eye surgery    . Oophorectomy    . Enterocele repair    . Cataract extraction  06/29/11    right eye with lens implant  . Breast surgery      right lumpectomy, snbx, apbi  . Tonsillectomy    . Basal cell carcinoma excision  1978    neck  . Dilation and curettage of uterus    . Hair transplant  2008  . Dermatolfibroma  2012  . Breast lumpectomy  2011    right  . Cataract removal od  06/28/2012   . Cataract removal os  12/13/2012    FAMILY HISTORY Family History  Problem Relation Age of Onset  . Breast cancer Maternal Aunt   . Hypertension Mother   . Stroke Mother   . Alzheimer's disease Father   . Colon cancer Neg Hx   . Hypertension Brother    The patient's father died with Alzheimer disease at the age of 2.  The patient's mother died at the age of 91 from a stroke; she was a smoker.  The patient has 1 brother and 1 sister; as far as she knows they are in good health.  There is no significant history of breast cancer in the family other than 2 maternal aunts who had breast cancer in their 48s.  GYNECOLOGIC HISTORY: The  patient is G0.  She was on Prempro for several years until February 2011  SOCIAL HISTORY: She worked for the [employer] for many years and also taught at [employer].  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  She also does a lot of bird watching.  Her 2 dogs currently are a Orthoptist and a terrier mix.   ADVANCED DIRECTIVES: in place; her HCPOA is Marilyn [name], who can be reached at [phone]  HEALTH MAINTENANCE: History  Substance Use Topics  . Smoking status: Never Smoker   . Smokeless tobacco: Never Used  . Alcohol Use: No     Colonoscopy:  PAP:  Bone density:  Lipid panel:  No Known Allergies  Current Outpatient Prescriptions  Medication Sig Dispense Refill  . acetaminophen (TYLENOL) 500 MG tablet Take 1 tablets every 6-8 hours  as needed for pain but no more than 6 tablets in a day      . alendronate (FOSAMAX) 70 MG tablet Take 1 tablet (70 mg total) by mouth every 7 (seven) days. Take with a full glass of water on an empty stomach.  15 tablet  6  . amLODipine (NORVASC) 10 MG tablet One daily to control BP  90 tablet  3  . aspirin 81 MG tablet Take 81 mg by mouth daily.      . AVODART 0.5 MG capsule Take 0.5 mg by mouth once a week.       . Calcium Carbonate-Vitamin D (CALCIUM 600 + D PO) Take 1 tablet by mouth daily.      . Calcium Polycarbophil ([facility] FIBER LAXATIVE PO) Take 2 tablets by mouth daily.      . cholecalciferol (VITAMIN D) 1000 UNITS tablet Take 1,000 Units by mouth daily.      [name] docusate sodium (COLACE) 100 MG capsule Take 100 mg by mouth daily.      . famotidine (PEPCID) 40 MG tablet Take 1 tablet (40 mg total) by mouth daily.  90 tablet  1  . FLUTAMIDE PO Take 250 mg by mouth 2 (two) times daily.       . Multiple Vitamins-Minerals (MULTIVITAMIN & MINERAL PO) Take 1 tablet by mouth daily.      . Probiotic Product (PROBIOTIC DAILY PO) Take by mouth daily.      . Selenium 200 MCG CAPS Take 1 capsule  by mouth daily.      . tamoxifen (NOLVADEX) 20 MG tablet Take 1 tablet (20 mg total) by mouth daily.  90 tablet  12  . vitamin C (ASCORBIC ACID) 500 MG tablet Take 500 mg by mouth daily.      . vitamin E 400 UNIT capsule Take 400 Units by mouth daily.      . Wheat Dextrin (BENEFIBER) POWD Take 1 each by mouth daily.       No current facility-administered medications for this visit.    OBJECTIVE: Middle-aged white woman in no acute distress Filed Vitals:   03/08/13 1111  BP: 134/72  Pulse: 81  Temp: 98.7 F (37.1 C)  Resp: 20     Body mass index is 25.92 kg/(m^2).    ECOG FS: 0  Sclerae unicteric, extraocular movements parallel and intact Oropharynx clear, good dentition No cervical or supraclavicular adenopathy Lungs no rales or rhonchi Heart regular rate and rhythm, no premature beats or murmur noted Abd soft, nontender, positive bowel sounds MSK no focal spinal tenderness, no upper extremity lymphedema Neuro: nonfocal, well oriented, positive affect Breasts: The right breast is status post lumpectomy and MammoSite radiation. There is no evidence of local recurrence. The right axilla is clear. The left breast is unremarkable  LAB RESULTS: Lab Results  Component Value Date   WBC 4.9 02/25/2010   NEUTROABS 3.0 02/25/2010   HGB 13.9 02/25/2010   HCT 41.1 02/25/2010   MCV 97.4 02/25/2010   PLT 287 02/25/2010      Chemistry      Component Value Date/Time   NA 142 11/11/2012 1055   NA 140 02/25/2010 1356   K 4.2 11/11/2012 1055   CL 102 11/11/2012 1055   CO2 26 11/11/2012 1055   BUN 19 11/11/2012 1055   BUN 19 02/25/2010 1356   CREATININE 0.74 11/11/2012 1055      Component Value Date/Time   CALCIUM 9.7 11/11/2012 1055   ALKPHOS 51 11/11/2012 1055  AST 18 11/11/2012 1055   ALT 12 11/11/2012 1055   BILITOT 0.4 11/11/2012 1055       Lab Results  Component Value Date   LABCA2 17 08/14/2009    No components found with this basename: [id number]    No results found for this basename: INR,   in the last 168 hours  Urinalysis No results found for this basename: colorurine,  appearanceur,  labspec,  phurine,  glucoseu,  hgbur,  bilirubinur,  ketonesur,  proteinur,  urobilinogen,  nitrite,  leukocytesur    STUDIES: Mammography January 2014 was benign. DEXA scan in August 2013 showed a T score of -2.3  ASSESSMENT: 72 y.o. [place] woman status post right lumpectomy and sentinel lymph node biopsy March 2011 for a T1b N0 grade 1 invasive ductal carcinoma, strongly estrogen and progesterone receptor positive with an MIB-1 of 11% and HER2 not amplified.   (1) status post  MammoSite radiation  (2) started tamoxifen in late March 2011  (3) with significant osteopenia, started on alendronate October 2013    PLAN: Zariyah is doing terrific from a breast cancer point of view. Her prognosis I think is good enough that 5 years of tamoxifen should be sufficient. I believe another 5 years, to make it 10 years of tamoxifen, would only reduce her risk of recurrence by 1 or 2% at the most. She is very comfortable with this plan.  Accordingly she will see me again in one year she will be 4 years out from her definitive surgery at that point. We are continuing daily and I made and repeating a bone density prior to the October 2015 visit. She knows to call for any problems that may develop before that.  MAGRINAT,GUSTAV C    03/08/2013

## 2013-03-16 ENCOUNTER — Other Ambulatory Visit: Payer: Self-pay | Admitting: Oncology

## 2013-05-13 ENCOUNTER — Other Ambulatory Visit: Payer: Self-pay | Admitting: Oncology

## 2013-05-15 ENCOUNTER — Telehealth: Payer: Self-pay | Admitting: *Deleted

## 2013-05-15 NOTE — Telephone Encounter (Signed)
Patient reports she did receive written script for the fosamax but she has misplaced it.

## 2013-05-19 ENCOUNTER — Encounter: Payer: Self-pay | Admitting: Physician Assistant

## 2013-05-19 ENCOUNTER — Ambulatory Visit (INDEPENDENT_AMBULATORY_CARE_PROVIDER_SITE_OTHER): Payer: Medicare Other | Admitting: Physician Assistant

## 2013-05-19 VITALS — BP 130/66 | HR 76 | Ht 65.0 in | Wt 153.4 lb

## 2013-05-19 DIAGNOSIS — K222 Esophageal obstruction: Secondary | ICD-10-CM

## 2013-05-19 DIAGNOSIS — R159 Full incontinence of feces: Secondary | ICD-10-CM

## 2013-05-19 DIAGNOSIS — K219 Gastro-esophageal reflux disease without esophagitis: Secondary | ICD-10-CM

## 2013-05-19 DIAGNOSIS — R140 Abdominal distension (gaseous): Secondary | ICD-10-CM

## 2013-05-19 DIAGNOSIS — R141 Gas pain: Secondary | ICD-10-CM

## 2013-05-19 MED ORDER — HYOSCYAMINE SULFATE 0.125 MG SL SUBL: SUBLINGUAL_TABLET | SUBLINGUAL | Status: DC

## 2013-05-19 MED ORDER — PANTOPRAZOLE SODIUM 40 MG PO TBEC: 40.0000 mg | DELAYED_RELEASE_TABLET | Freq: Every day | ORAL | Status: DC

## 2013-05-19 NOTE — Progress Notes (Signed)
Reviewed and agree.

## 2013-05-19 NOTE — Patient Instructions (Signed)
We sent prescriptions to [facility]  [address], [place].  1. Protonix ( Pantoprazole Sodium ) 2. Levsin ( Hyoscyamine ) Sublingual .   We have given you a low gas diet brochure. Continue the daily probiotic and fiber supplement.   Follow up with Dr. Juanda Chance as needed.

## 2013-05-19 NOTE — Progress Notes (Signed)
Subjective:    Patient ID: Marilyn [name], female    DOB: November 10, 1940, 72 y.o.   MRN: [id number]  HPI  Marilyn [name]  is a pleasant 72 year old white female known to Dr. Lina Sar. She does have history of stage I breast cancer and underwent lumpectomy in 2011 followed by radiation. She also has history of hypertension, diverticulosis, IBS and GERD. She did have a bilateral oophorectomy and and enterocele repair remotely and is status post appendectomy and inguinal hernia repair.   EGD was done in January 2014 showing a distal esophageal stricture she was dilated Savary to 16 mm. She was also noted to have a deformed antrum probably from previous peptic ulcer disease biopsies were taken from the stomach and small bowel all of which were benign H. pylori was negative. Colonoscopy was done in January 2014 showing severe diverticulosis of the entire colon she has mild rectal erythema and was noted to have an incompetent anal sphincter.   she comes in today with questions about both of these procedures and findings and need for followup.   she has rare episodes of incontinence which do not sound like full fecal incontinence but rather an urgency followed by passage of a small amount of fluid or stool that may feel like a need for flatulence. She has been on a fiber supplement daily and is very focused on her diet. She currently has no complaints of abdominal pain has no heartburn or indigestion though she is aware occasionally that she's refluxing particularly with bending over etc. She has been on famotidine 40 mg daily over the past several months but says she's not sure why she was taken off of a PPI. She would like to try PPI again ;she denies any current dysphagia or dysphagia odynophagia. She has a lot of burping and belching on a regular basis. She is concerned about the antral deformity and possible prior ulcer disease as she didn't have any symptoms. She says she ihas  thought back and a couple of years ago  she had problems with her back, I believe, and had taken up to 6-8 ibuprofen per day for several months.    Review of Systems  Constitutional: Negative.   HENT: Negative.   Eyes: Negative.   Respiratory: Negative.   Cardiovascular: Negative.   Gastrointestinal: Positive for constipation.  Endocrine: Negative.   Genitourinary: Negative.   Musculoskeletal: Negative.   Skin: Negative.   Allergic/Immunologic: Negative.   Neurological: Negative.   Hematological: Negative.   Psychiatric/Behavioral: Negative.    Outpatient Prescriptions Prior to Visit  Medication Sig Dispense Refill  . acetaminophen (TYLENOL) 500 MG tablet Take 1 tablets every 6-8 hours as needed for pain but no more than 6 tablets in a day      . alendronate (FOSAMAX) 70 MG tablet TAKE 1 TABLET BY MOUTH EVERY 7 DAYS WITH A GLAS OF WATER ON AN EMPTY STOMACH  15 tablet  5  . amLODipine (NORVASC) 10 MG tablet One daily to control BP  90 tablet  3  . aspirin 81 MG tablet Take 81 mg by mouth daily.      . AVODART 0.5 MG capsule Take 0.5 mg by mouth once a week.       . Calcium Carbonate-Vitamin D (CALCIUM 600 + D PO) Take 1 tablet by mouth daily.      . Calcium Polycarbophil ([facility] FIBER LAXATIVE PO) Take 2 tablets by mouth daily.      . cholecalciferol (VITAMIN D) 1000 UNITS  tablet Take 1,000 Units by mouth daily.      [name] docusate sodium (COLACE) 100 MG capsule Take 100 mg by mouth daily.      . famotidine (PEPCID) 40 MG tablet Take 1 tablet (40 mg total) by mouth daily.  90 tablet  1  . FLUTAMIDE PO Take 250 mg by mouth 2 (two) times daily.       . Multiple Vitamins-Minerals (MULTIVITAMIN & MINERAL PO) Take 1 tablet by mouth daily.      . Probiotic Product (PROBIOTIC DAILY PO) Take by mouth daily.      . Selenium 200 MCG CAPS Take 1 capsule by mouth daily.      . tamoxifen (NOLVADEX) 20 MG tablet TAKE 1 TABLET BY MOUTH EVERY DAY  90 tablet  4  . vitamin C (ASCORBIC ACID) 500 MG tablet Take 500 mg by mouth daily.      .  vitamin E 400 UNIT capsule Take 400 Units by mouth daily.      . Wheat Dextrin (BENEFIBER) POWD Take 1 each by mouth daily.       No facility-administered medications prior to visit.   Allergies  Allergen Reactions  . Cetacaine [Butamben-Tetracaine-Benzocaine]     "loss of taste"   Patient Active Problem List   Diagnosis Date Noted  . Breast cancer of upper-outer quadrant of right female breast 03/08/2013  . Diplopia 03/01/2013  . Reflux esophagitis   . Unspecified hypothyroidism   . Other and unspecified hyperlipidemia   . Esophageal stricture   . Decreased rectal sphincter tone 07/01/2012  . HYPERTENSION 10/29/2007  . INGUINAL HERNIA 10/29/2007  . DIVERTICULOSIS, COLON 10/29/2007  . IBS 10/29/2007  . RECTAL PROLAPSE 10/29/2007  . HEADACHE, CHRONIC 10/29/2007   History  Substance Use Topics  . Smoking status: Never Smoker   . Smokeless tobacco: Never Used  . Alcohol Use: No   family history includes Alzheimer's disease in her father; Breast cancer in her maternal aunt; Gallbladder disease in her brother; Hypertension in her brother and mother; Stroke in her mother. There is no history of Colon cancer.     Objective:   Physical Exam  well-developed older white female in no acute distress, quite pleasant blood pressure 130/66 pulse 76 height 5 foot 5 weight 153. HEENT nontraumatic normocephalic EOMI PERRLA sclera anicteric, Supple no JVD, Cardiovascular regular rate and rhythm with S1-S2 no murmur or gallop, Pulmonary clear bilaterally, Abdomen soft nondistended nontender bowel sounds are active no palpable mass or hepatosplenomegaly, Rectal exam not done, Psych mood and affect normal and appropriate        Assessment & Plan:  #36  72 year old female with chronic GERD and history of esophageal stricture currently asymptomatic #2 IBS with gas and belching currently #3 severe diverticulosis #4 intermittent greater fecal incontinence #5 history of enterocele repair #6  antral deformity on EGD January 2014 felt probably secondary to previous peptic ulcer disease  Plan; Will switch Pepcid to Protonix 40 mg every morning for long-term acid suppression Low gas diet Continue daily probiotic Continue daily fiber supplement We'll give her a trial of Levsin sublingual 4 episodes of urgency to see if this can help prevent her sporadic episodes of fecal incontinence She will followup with Dr. Juanda Chance in one year or sooner as needed

## 2013-05-29 ENCOUNTER — Other Ambulatory Visit: Payer: Self-pay | Admitting: Internal Medicine

## 2013-05-29 NOTE — Telephone Encounter (Signed)
OK to fill this.

## 2013-05-29 NOTE — Telephone Encounter (Signed)
Is this okay to refill pt's Flutamide 125 mg.  Historically filled by another physician.  Please advise. thanks

## 2013-06-14 ENCOUNTER — Ambulatory Visit (INDEPENDENT_AMBULATORY_CARE_PROVIDER_SITE_OTHER): Payer: Medicare Other | Admitting: Internal Medicine

## 2013-06-14 ENCOUNTER — Encounter: Payer: Self-pay | Admitting: Internal Medicine

## 2013-06-14 VITALS — BP 118/72 | HR 73 | Temp 98.1°F | Resp 18 | Ht 65.0 in | Wt 152.4 lb

## 2013-06-14 DIAGNOSIS — E785 Hyperlipidemia, unspecified: Secondary | ICD-10-CM

## 2013-06-14 DIAGNOSIS — K2100 Gastro-esophageal reflux disease with esophagitis, without bleeding: Secondary | ICD-10-CM

## 2013-06-14 DIAGNOSIS — C50419 Malignant neoplasm of upper-outer quadrant of unspecified female breast: Secondary | ICD-10-CM

## 2013-06-14 DIAGNOSIS — E039 Hypothyroidism, unspecified: Secondary | ICD-10-CM

## 2013-06-14 DIAGNOSIS — H532 Diplopia: Secondary | ICD-10-CM

## 2013-06-14 DIAGNOSIS — C50411 Malignant neoplasm of upper-outer quadrant of right female breast: Secondary | ICD-10-CM

## 2013-06-14 DIAGNOSIS — I10 Essential (primary) hypertension: Secondary | ICD-10-CM

## 2013-06-14 DIAGNOSIS — K589 Irritable bowel syndrome without diarrhea: Secondary | ICD-10-CM

## 2013-06-14 NOTE — Patient Instructions (Addendum)
Use vaseline or Aquaphor on thumbnail.  Continue current medication.

## 2013-06-14 NOTE — Progress Notes (Signed)
Patient ID: Marilyn [name], female   DOB: May 16, 1941, 73 y.o.   MRN: [id number]    Location:     [facility]   Place of Service:  OFFICE    Allergies  Allergen Reactions  . Cetacaine [Butamben-Tetracaine-Benzocaine]     "loss of taste"    Chief Complaint  Patient presents with  . Follow-up    labwork    HPI:  Breast cancer of upper-outer quadrant of right female breast -stable  HYPERTENSION: controlled  Unspecified hypothyroidism: controlled  Diplopia: resolved  Other and unspecified hyperlipidemia : stable  IBS: improved  Reflux : asymptomatic. Wants to stay with pantoprazole. Previously took Pepcid.    Medications: Patient's Medications  New Prescriptions   No medications on file  Previous Medications   ACETAMINOPHEN (TYLENOL) 500 MG TABLET    Take 1 tablets every 6-8 hours as needed for pain but no more than 6 tablets in a day   ALENDRONATE (FOSAMAX) 70 MG TABLET    TAKE 1 TABLET BY MOUTH EVERY 7 DAYS WITH A GLAS OF WATER ON AN EMPTY STOMACH   AMLODIPINE (NORVASC) 10 MG TABLET    One daily to control BP   ASPIRIN 81 MG TABLET    Take 81 mg by mouth daily.   AVODART 0.5 MG CAPSULE    Take 0.5 mg by mouth once a week.    CALCIUM CARBONATE-VITAMIN D (CALCIUM 600 + D PO)    Take 1 tablet by mouth daily.   CALCIUM POLYCARBOPHIL ([facility]S FIBER LAXATIVE PO)    Take 2 tablets by mouth daily.   CHOLECALCIFEROL (VITAMIN D) 1000 UNITS TABLET    Take 1,000 Units by mouth daily.   DOCUSATE SODIUM (COLACE) 100 MG CAPSULE    Take 100 mg by mouth daily.   FAMOTIDINE (PEPCID) 40 MG TABLET    Take 1 tablet (40 mg total) by mouth daily.   FLUTAMIDE (EULEXIN) 125 MG CAPSULE    TAKE 2 CAPSULES EVERY DAY   HYOSCYAMINE (LEVSIN/SL) 0.125 MG SL TABLET    Place 1 tab on the tongue to dissolve as needed for urgency, diarrhea.   MULTIPLE VITAMINS-MINERALS (MULTIVITAMIN & MINERAL PO)    Take 1 tablet by mouth daily.   PANTOPRAZOLE (PROTONIX) 40 MG TABLET    Take 1 tablet (40 mg total) by mouth daily.    PROBIOTIC PRODUCT (PROBIOTIC DAILY PO)    Take by mouth daily.   SELENIUM 200 MCG CAPS    Take 1 capsule by mouth daily.   TAMOXIFEN (NOLVADEX) 20 MG TABLET    TAKE 1 TABLET BY MOUTH EVERY DAY   VITAMIN C (ASCORBIC ACID) 500 MG TABLET    Take 500 mg by mouth daily.   VITAMIN E 400 UNIT CAPSULE    Take 400 Units by mouth daily.   WHEAT DEXTRIN (BENEFIBER) POWD    Take 1 each by mouth daily.  Modified Medications   No medications on file  Discontinued Medications   FLUTAMIDE PO    Take 250 mg by mouth 2 (two) times daily.      Review of Systems  Constitutional: Negative for fever, activity change, appetite change, fatigue and unexpected weight change.  HENT: Negative.   Eyes: Positive for visual disturbance (corrective lenses).  Cardiovascular: Negative.   Gastrointestinal: Negative.   Endocrine: Negative.   Genitourinary: Negative.   Musculoskeletal: Negative.   Skin: Negative.   Neurological:       Some difficulty with balance  Hematological: Negative.   Psychiatric/Behavioral:  Hx of mild obsessive and hoarding disorder    Filed Vitals:   06/14/13 1432  BP: 118/72  Pulse: 73  Temp: 98.1 F (36.7 C)  TempSrc: Oral  Resp: 18  Height: 5\' 5"  (1.651 m)  Weight: 152 lb 6.4 oz (69.128 kg)  SpO2: 97%   Physical Exam  Constitutional: She is oriented to person, place, and time. She appears well-developed and well-nourished. No distress.  HENT:  Head: Normocephalic and atraumatic.  Right Ear: External ear normal.  Left Ear: External ear normal.  Nose: Nose normal.  Mouth/Throat: Oropharynx is clear and moist.  Eyes: Conjunctivae and EOM are normal. Pupils are equal, round, and reactive to light.  Mild pupil irregularity due to cataract extraction of the right eye  Neck: No JVD present. No tracheal deviation present. No thyromegaly present.  Cardiovascular: Normal rate, regular rhythm, normal heart sounds and intact distal pulses.  Exam reveals no gallop and no  friction rub.   No murmur heard. Pulmonary/Chest: No respiratory distress. She has no wheezes. She has no rales.  Abdominal: She exhibits no distension and no mass. There is no tenderness.  Genitourinary:  Done by Dr. Dory Horn  Musculoskeletal: Normal range of motion. She exhibits no edema and no tenderness.  Lymphadenopathy:    She has no cervical adenopathy.  Neurological: She is alert and oriented to person, place, and time. She has normal reflexes. No cranial nerve deficit. Coordination normal.  Skin: No rash noted.  Psychiatric: She has a normal mood and affect. Her behavior is normal. Thought content normal.     Labs reviewed: No visits with results within 3 Month(s) from this visit. Latest known visit with results is:  Appointment on 02/27/2013  Component Date Value Range Status  . TSH 02/27/2013 4.450  0.450 - 4.500 uIU/mL Final      Assessment/Plan  Breast cancer of upper-outer quadrant of right female breast - Plan: CMP, CBC With differential/Platelet  HYPERTENSION: controlled  Unspecified hypothyroidism: controlled  Diplopia: resolved  Other and unspecified hyperlipidemia - Plan: Lipid panel  IBS: improved  Reflux esophagitis: asymptomatic.

## 2013-06-15 LAB — CBC WITH DIFFERENTIAL
BASOS ABS: 0.1 10*3/uL (ref 0.0–0.2)
Basos: 1 %
EOS: 1 %
Eosinophils Absolute: 0.1 10*3/uL (ref 0.0–0.4)
HCT: 39.9 % (ref 34.0–46.6)
Hemoglobin: 13.4 g/dL (ref 11.1–15.9)
IMMATURE GRANS (ABS): 0 10*3/uL (ref 0.0–0.1)
IMMATURE GRANULOCYTES: 0 %
Lymphocytes Absolute: 1.8 10*3/uL (ref 0.7–3.1)
Lymphs: 32 %
MCH: 31.7 pg (ref 26.6–33.0)
MCHC: 33.6 g/dL (ref 31.5–35.7)
MCV: 94 fL (ref 79–97)
MONOCYTES: 10 %
MONOS ABS: 0.5 10*3/uL (ref 0.1–0.9)
NEUTROS PCT: 56 %
Neutrophils Absolute: 3.1 10*3/uL (ref 1.4–7.0)
PLATELETS: 355 10*3/uL (ref 150–379)
RBC: 4.23 x10E6/uL (ref 3.77–5.28)
RDW: 12.7 % (ref 12.3–15.4)
WBC: 5.6 10*3/uL (ref 3.4–10.8)

## 2013-06-15 LAB — LIPID PANEL
Chol/HDL Ratio: 3.4 ratio units (ref 0.0–4.4)
Cholesterol, Total: 188 mg/dL (ref 100–199)
HDL: 55 mg/dL (ref >39)
LDL CALC: 116 mg/dL — AB (ref 0–99)
Triglycerides: 85 mg/dL (ref 0–149)
VLDL CHOLESTEROL CAL: 17 mg/dL (ref 5–40)

## 2013-06-15 LAB — COMPREHENSIVE METABOLIC PANEL
A/G RATIO: 1.8 (ref 1.1–2.5)
ALBUMIN: 4.5 g/dL (ref 3.5–4.8)
ALT: 12 IU/L (ref 0–32)
AST: 16 IU/L (ref 0–40)
Alkaline Phosphatase: 53 IU/L (ref 39–117)
BUN/Creatinine Ratio: 22 (ref 11–26)
BUN: 18 mg/dL (ref 8–27)
CALCIUM: 9.5 mg/dL (ref 8.6–10.2)
CO2: 26 mmol/L (ref 18–29)
Chloride: 100 mmol/L (ref 97–108)
Creatinine, Ser: 0.82 mg/dL (ref 0.57–1.00)
GFR, EST AFRICAN AMERICAN: 82 mL/min/{1.73_m2} (ref >59)
GFR, EST NON AFRICAN AMERICAN: 71 mL/min/{1.73_m2} (ref >59)
GLUCOSE: 81 mg/dL (ref 65–99)
Globulin, Total: 2.5 g/dL (ref 1.5–4.5)
Potassium: 4.3 mmol/L (ref 3.5–5.2)
Sodium: 141 mmol/L (ref 134–144)
TOTAL PROTEIN: 7 g/dL (ref 6.0–8.5)

## 2013-06-21 ENCOUNTER — Telehealth: Payer: Self-pay | Admitting: *Deleted

## 2013-06-21 NOTE — Telephone Encounter (Signed)
Patient called and wanted [place] to fax her bloodwork to Dr. Leonie Green at [facility]. Faxed to # [phone]. Patient Notified.

## 2013-07-26 ENCOUNTER — Encounter: Payer: Self-pay | Admitting: Oncology

## 2013-07-31 ENCOUNTER — Encounter (INDEPENDENT_AMBULATORY_CARE_PROVIDER_SITE_OTHER): Payer: Self-pay

## 2013-09-13 ENCOUNTER — Encounter (INDEPENDENT_AMBULATORY_CARE_PROVIDER_SITE_OTHER): Payer: Self-pay | Admitting: General Surgery

## 2013-09-13 ENCOUNTER — Ambulatory Visit (INDEPENDENT_AMBULATORY_CARE_PROVIDER_SITE_OTHER): Payer: 59 | Admitting: General Surgery

## 2013-09-13 VITALS — BP 110/68 | HR 80 | Temp 97.3°F | Resp 16 | Ht 65.0 in | Wt 152.0 lb

## 2013-09-13 DIAGNOSIS — C50411 Malignant neoplasm of upper-outer quadrant of right female breast: Secondary | ICD-10-CM

## 2013-09-13 DIAGNOSIS — C50419 Malignant neoplasm of upper-outer quadrant of unspecified female breast: Secondary | ICD-10-CM

## 2013-09-13 NOTE — Progress Notes (Signed)
Subjective:     Patient ID: Marilyn [name], female   DOB: 05-14-1941, 73 y.o.   MRN: [id number]  HPI This is a 73 year old female who underwent a right breast lumpectomy and sentinel node biopsy for a stage I right breast cancer. She was treated with a MammoSite followed by tamoxifen. She is remaining on tamoxifen and has the occasional hot flashes. She has a little bit of a persistent seroma at her MammoSite. She is otherwise doing well and was without any complaints. She has a mammogram in January of this year that is recommended for followup in one year.  Review of Systems     Objective:   Physical Exam  Vitals reviewed. Constitutional: She appears well-developed and well-nourished.  Neck: Neck supple.  Pulmonary/Chest: Right breast exhibits no inverted nipple, no mass, no nipple discharge, no skin change and no tenderness. Left breast exhibits no inverted nipple, no mass, no nipple discharge, no skin change and no tenderness.    Lymphadenopathy:    She has no cervical adenopathy.    She has no axillary adenopathy.       Right: No supraclavicular adenopathy present.       Left: No supraclavicular adenopathy present.       Assessment:     Stage I right breast cancer     Plan:     She has no clinical evidence of recurrence. She is overall doing very well. She's going to get her mammogram next January, continue her on monthly self exams, continue her antiestrogen, and will follow up with medical oncology in 6 months. I will plan on seeing her back in one year.

## 2013-09-14 ENCOUNTER — Telehealth: Payer: Self-pay | Admitting: *Deleted

## 2013-09-14 NOTE — Telephone Encounter (Signed)
Patient called and stated that she has a scratchy throat and feeling fatigue. Wanted to know what she could do without having to come in. Told her to increase her fluids and get plenty of rest and to get an OTC decongest medication, but to check with her pharmacist first to see what would be good for her with her medication and to take over the weekend. She agreed and will call us on Monday.

## 2013-10-17 ENCOUNTER — Ambulatory Visit (INDEPENDENT_AMBULATORY_CARE_PROVIDER_SITE_OTHER): Payer: Medicare Other | Admitting: Internal Medicine

## 2013-10-17 ENCOUNTER — Encounter: Payer: Self-pay | Admitting: Internal Medicine

## 2013-10-17 VITALS — BP 138/72 | HR 69 | Temp 99.1°F | Resp 18 | Ht 65.0 in | Wt 153.8 lb

## 2013-10-17 DIAGNOSIS — H532 Diplopia: Secondary | ICD-10-CM

## 2013-10-17 DIAGNOSIS — R159 Full incontinence of feces: Secondary | ICD-10-CM

## 2013-10-17 DIAGNOSIS — K222 Esophageal obstruction: Secondary | ICD-10-CM

## 2013-10-17 DIAGNOSIS — M545 Low back pain, unspecified: Secondary | ICD-10-CM | POA: Insufficient documentation

## 2013-10-17 DIAGNOSIS — C50411 Malignant neoplasm of upper-outer quadrant of right female breast: Secondary | ICD-10-CM

## 2013-10-17 DIAGNOSIS — E039 Hypothyroidism, unspecified: Secondary | ICD-10-CM

## 2013-10-17 DIAGNOSIS — C50419 Malignant neoplasm of upper-outer quadrant of unspecified female breast: Secondary | ICD-10-CM

## 2013-10-17 DIAGNOSIS — E785 Hyperlipidemia, unspecified: Secondary | ICD-10-CM

## 2013-10-17 DIAGNOSIS — I10 Essential (primary) hypertension: Secondary | ICD-10-CM

## 2013-10-17 DIAGNOSIS — K623 Rectal prolapse: Secondary | ICD-10-CM

## 2013-10-17 DIAGNOSIS — K589 Irritable bowel syndrome without diarrhea: Secondary | ICD-10-CM

## 2013-10-17 DIAGNOSIS — K2100 Gastro-esophageal reflux disease with esophagitis, without bleeding: Secondary | ICD-10-CM

## 2013-10-17 DIAGNOSIS — H9190 Unspecified hearing loss, unspecified ear: Secondary | ICD-10-CM

## 2013-10-17 MED ORDER — TRAMADOL HCL 50 MG PO TABS: ORAL_TABLET | ORAL | Status: DC

## 2013-10-17 NOTE — Patient Instructions (Signed)
Continue current medication.

## 2013-10-17 NOTE — Progress Notes (Signed)
Patient ID: Marilyn [name], female   DOB: Jun 29, 1940, 73 y.o.   MRN: [id number]    Location:  [facility]   Place of Service: OFFICE    Allergies  Allergen Reactions  . Cetacaine [Butamben-Tetracaine-Benzocaine]     "loss of taste"    Chief Complaint  Patient presents with  . Follow-up    HPI:  HYPERTENSION: controlled  IBS: chronic and uncahnged  Fecal incontinence; improved  Esophageal stricture: episodes of food sticking in her chest  Reflux esophagitis: denies heartburn  Breast cancer of upper-outer quadrant of right female breast: no relapse  Rectal prolapse: she does not think this has ever been a problem. She will discuss with Dr. Olevia Perches  Hearing decreased - requests referral to ENT  Lumbago - chronic and unchanged  Diplopia: resolved    Medications: Patient's Medications  New Prescriptions   TRAMADOL (ULTRAM) 50 MG TABLET    One every 6 hours if needed for pain  Previous Medications   ACETAMINOPHEN (TYLENOL) 500 MG TABLET    Take 1 tablets every 6-8 hours as needed for pain but no more than 6 tablets in a day   ALENDRONATE (FOSAMAX) 70 MG TABLET    TAKE 1 TABLET BY MOUTH EVERY 7 DAYS WITH A GLAS OF WATER ON AN EMPTY STOMACH   AMLODIPINE (NORVASC) 10 MG TABLET    One daily to control BP   ASPIRIN 81 MG TABLET    Take 81 mg by mouth daily.   AVODART 0.5 MG CAPSULE    Take 0.5 mg by mouth once a week.    CALCIUM CARBONATE-VITAMIN D (CALCIUM 600 + D PO)    Take 1 tablet by mouth daily.   CALCIUM POLYCARBOPHIL ([employer] FIBER LAXATIVE PO)    Take 2 tablets by mouth daily.   CHOLECALCIFEROL (VITAMIN D) 1000 UNITS TABLET    Take 1,000 Units by mouth daily.   DOCUSATE SODIUM (COLACE) 100 MG CAPSULE    Take 100 mg by mouth daily.   FLUTAMIDE (EULEXIN) 125 MG CAPSULE    TAKE 2 CAPSULES EVERY DAY   HYOSCYAMINE (LEVSIN/SL) 0.125 MG SL TABLET    Place 1 tab on the tongue to dissolve as needed for urgency, diarrhea.   MULTIPLE VITAMINS-MINERALS (MULTIVITAMIN & MINERAL PO)     Take 1 tablet by mouth daily.   PANTOPRAZOLE (PROTONIX) 40 MG TABLET    Take 1 tablet (40 mg total) by mouth daily.   PROBIOTIC PRODUCT (PROBIOTIC DAILY PO)    Take by mouth daily.   SELENIUM 200 MCG CAPS    Take 1 capsule by mouth daily.   TAMOXIFEN (NOLVADEX) 20 MG TABLET    TAKE 1 TABLET BY MOUTH EVERY DAY   VITAMIN C (ASCORBIC ACID) 500 MG TABLET    Take 500 mg by mouth daily.   VITAMIN E 400 UNIT CAPSULE    Take 400 Units by mouth daily.   WHEAT DEXTRIN (BENEFIBER) POWD    Take 1 each by mouth daily.  Modified Medications   No medications on file  Discontinued Medications   No medications on file     Review of Systems  Constitutional: Negative for fever, activity change, appetite change, fatigue and unexpected weight change.  HENT: Negative.   Eyes: Positive for visual disturbance (corrective lenses).  Cardiovascular: Negative.   Gastrointestinal:       Hx IBS  Endocrine: Negative.   Genitourinary: Negative.   Musculoskeletal: Negative.   Skin: Negative.   Neurological:  Some difficulty with balance  Hematological: Negative.   Psychiatric/Behavioral:       Hx of mild obsessive and hoarding disorder    Filed Vitals:   10/17/13 1352  BP: 138/72  Pulse: 69  Temp: 99.1 F (37.3 C)  TempSrc: Oral  Resp: 18  Height: 5\' 5"  (1.651 m)  Weight: 153 lb 12.8 oz (69.763 kg)  SpO2: 98%   Body mass index is 25.59 kg/(m^2).  Physical Exam  Constitutional: She is oriented to person, place, and time. She appears well-developed and well-nourished. No distress.  HENT:  Head: Normocephalic and atraumatic.  Right Ear: External ear normal.  Left Ear: External ear normal.  Nose: Nose normal.  Mouth/Throat: Oropharynx is clear and moist.  Eyes: Conjunctivae and EOM are normal. Pupils are equal, round, and reactive to light.  Mild pupil irregularity due to cataract extraction of the right eye  Neck: No JVD present. No tracheal deviation present. No thyromegaly present.    Cardiovascular: Normal rate, regular rhythm, normal heart sounds and intact distal pulses.  Exam reveals no gallop and no friction rub.   No murmur heard. Pulmonary/Chest: No respiratory distress. She has no wheezes. She has no rales.  Abdominal: She exhibits no distension and no mass. There is no tenderness.  Genitourinary:  Done by Dr. Dory Horn  Musculoskeletal: Normal range of motion. She exhibits no edema and no tenderness.  Lymphadenopathy:    She has no cervical adenopathy.  Neurological: She is alert and oriented to person, place, and time. She has normal reflexes. No cranial nerve deficit. Coordination normal.  Skin: No rash noted.  Psychiatric: She has a normal mood and affect. Her behavior is normal. Thought content normal.     Labs reviewed: No visits with results within 3 Month(s) from this visit. Latest known visit with results is:  Office Visit on 06/14/2013  Component Date Value Ref Range Status  . Glucose 06/14/2013 81  65 - 99 mg/dL Final  . BUN 06/14/2013 18  8 - 27 mg/dL Final  . Creatinine, Ser 06/14/2013 0.82  0.57 - 1.00 mg/dL Final  . GFR calc non Af Amer 06/14/2013 71  >59 mL/min/1.73 Final  . GFR calc Af Amer 06/14/2013 82  >59 mL/min/1.73 Final  . BUN/Creatinine Ratio 06/14/2013 22  11 - 26 Final  . Sodium 06/14/2013 141  134 - 144 mmol/L Final  . Potassium 06/14/2013 4.3  3.5 - 5.2 mmol/L Final  . Chloride 06/14/2013 100  97 - 108 mmol/L Final  . CO2 06/14/2013 26  18 - 29 mmol/L Final  . Calcium 06/14/2013 9.5  8.6 - 10.2 mg/dL Final   Comment: **Effective June 26, 2013 the reference interval**                            for Calcium, Serum will be changing to:                                       Age                Female          Female                                    0 - 10 days  8.6 - 10.4     8.6 - 10.4                              11 days -  1 year        9.2 - 11.0     9.2 - 11.0                                    2 - 11 years        9.1 - 10.5     9.1 - 10.5                                   12 - 17 years       8.9 - 10.4     8.9 - 10.4                                   18 - 59 years       8.7 - 10.2     8.7 - 10.2                                       >59 years       8.6 - 10.2     8.7 - 10.3  . Total Protein 06/14/2013 7.0  6.0 - 8.5 g/dL Final  . Albumin 06/14/2013 4.5  3.5 - 4.8 g/dL Final  . Globulin, Total 06/14/2013 2.5  1.5 - 4.5 g/dL Final  . Albumin/Globulin Ratio 06/14/2013 1.8  1.1 - 2.5 Final  . Total Bilirubin 06/14/2013 <0.2  0.0 - 1.2 mg/dL Final  . Alkaline Phosphatase 06/14/2013 53  39 - 117 IU/L Final  . AST 06/14/2013 16  0 - 40 IU/L Final  . ALT 06/14/2013 12  0 - 32 IU/L Final  . Cholesterol, Total 06/14/2013 188  100 - 199 mg/dL Final  . Triglycerides 06/14/2013 85  0 - 149 mg/dL Final  . HDL 06/14/2013 55  >39 mg/dL Final   Comment: According to ATP-III Guidelines, HDL-C >59 mg/dL is considered a                          negative risk factor for CHD.  [name] VLDL Cholesterol Cal 06/14/2013 17  5 - 40 mg/dL Final  . LDL Calculated 06/14/2013 116* 0 - 99 mg/dL Final  . Chol/HDL Ratio 06/14/2013 3.4  0.0 - 4.4 ratio units Final   Comment:                                   T. Chol/HDL Ratio                                                                      Men  Women  1/2 Avg.Risk  3.4    3.3                                                            Avg.Risk  5.0    4.4                                                         2X Avg.Risk  9.6    7.1                                                         3X Avg.Risk 23.4   11.0  . WBC 06/14/2013 5.6  3.4 - 10.8 x10E3/uL Final  . RBC 06/14/2013 4.23  3.77 - 5.28 x10E6/uL Final  . Hemoglobin 06/14/2013 13.4  11.1 - 15.9 g/dL Final  . HCT 06/14/2013 39.9  34.0 - 46.6 % Final  . MCV 06/14/2013 94  79 - 97 fL Final  . MCH 06/14/2013 31.7  26.6 - 33.0 pg Final  . MCHC 06/14/2013 33.6  31.5 - 35.7  g/dL Final  . RDW 06/14/2013 12.7  12.3 - 15.4 % Final  . Platelets 06/14/2013 355  150 - 379 x10E3/uL Final  . Neutrophils Relative % 06/14/2013 56   Final  . Lymphs 06/14/2013 32   Final  . Monocytes 06/14/2013 10   Final  . Eos 06/14/2013 1   Final  . Basos 06/14/2013 1   Final  . Neutrophils Absolute 06/14/2013 3.1  1.4 - 7.0 x10E3/uL Final  . Lymphocytes Absolute 06/14/2013 1.8  0.7 - 3.1 x10E3/uL Final  . Monocytes Absolute 06/14/2013 0.5  0.1 - 0.9 x10E3/uL Final  . Eosinophils Absolute 06/14/2013 0.1  0.0 - 0.4 x10E3/uL Final  . Basophils Absolute 06/14/2013 0.1  0.0 - 0.2 x10E3/uL Final  . Immature Granulocytes 06/14/2013 0   Final  . Immature Grans (Abs) 06/14/2013 0.0  0.0 - 0.1 x10E3/uL Final      Assessment/Plan 1. HYPERTENSION - Comprehensive metabolic panel; Future - EKG 12-Lead; Future  2. IBS stable  3. Unspecified hypothyroidism compensatd - TSH; Future  4. Other and unspecified hyperlipidemia - Lipid panel; Future  5. Fecal incontinence improved  6. Esophageal stricture Food sticking in chest occasionally  7. Reflux esophagitis improved  8. Breast cancer of upper-outer quadrant of right female breast In remission  9. Rectal prolapse She questions if this is a correct diagnosis and wil discuss further with Dr. Cristal Generous  10. Hearing decreased - Ambulatory referral to ENT  11. Lumbago - traMADol (ULTRAM) 50 MG tablet; One every 6 hours if needed for pain  Dispense: 60 tablet; Refill: 0  12. Diplopia resolved

## 2013-11-30 ENCOUNTER — Other Ambulatory Visit: Payer: Self-pay | Admitting: Dermatology

## 2014-01-12 ENCOUNTER — Other Ambulatory Visit: Payer: Self-pay | Admitting: Oncology

## 2014-01-19 ENCOUNTER — Telehealth: Payer: Self-pay | Admitting: Internal Medicine

## 2014-01-19 NOTE — Telephone Encounter (Signed)
Patient calls stating that she continues to have lower abdominal pressure and urge to have a bm although she typically cannot have a bm. She does not notice any blood in the stool. Occasionally, she does have liquid material (sometimes stool, othertimes not) in her underwear. She also notes that she is having increased flatulence, more than usual. She has taken Levsin SL only once per day and wonders if she should take more to control her symptoms. I advised that she should be able to take 2-3 times daily for some of her symptoms. She would also like Dr Nichola Sizer input into what more she can do.

## 2014-01-19 NOTE — Telephone Encounter (Signed)
Please add Levbid .375 mg, #60, 1 po bid, 1 refill, to take daily. The Levsin SL.125mg  may be prn crampy abd pain . Also, start Anusol HC supp, #12, insert 1 hs, 1 refill.

## 2014-01-22 ENCOUNTER — Encounter: Payer: Self-pay | Admitting: *Deleted

## 2014-01-22 NOTE — Telephone Encounter (Signed)
I have left a message for patient to call back. 

## 2014-01-23 NOTE — Telephone Encounter (Signed)
Patient declines to start [place] on a daily basis. She states that she tried the Levsin SL tid this weekend and it helped, so she does not feel the need to take a medication daily. She will discuss this with Dr Olevia Perches at her upcoming Dr appt.

## 2014-01-31 LAB — HM DEXA SCAN

## 2014-02-06 ENCOUNTER — Encounter: Payer: Self-pay | Admitting: *Deleted

## 2014-02-13 ENCOUNTER — Other Ambulatory Visit: Payer: Self-pay | Admitting: Oncology

## 2014-02-13 DIAGNOSIS — C50919 Malignant neoplasm of unspecified site of unspecified female breast: Secondary | ICD-10-CM

## 2014-02-14 ENCOUNTER — Telehealth: Payer: Self-pay | Admitting: Oncology

## 2014-02-14 NOTE — Telephone Encounter (Signed)
per pof to sch pt lab-sch and spoke to pt to give pt appt time & date

## 2014-02-19 ENCOUNTER — Other Ambulatory Visit: Payer: Self-pay | Admitting: Oncology

## 2014-02-20 ENCOUNTER — Other Ambulatory Visit: Payer: Self-pay | Admitting: Internal Medicine

## 2014-02-27 ENCOUNTER — Encounter: Payer: Self-pay | Admitting: Internal Medicine

## 2014-02-27 ENCOUNTER — Ambulatory Visit (INDEPENDENT_AMBULATORY_CARE_PROVIDER_SITE_OTHER): Payer: Medicare Other | Admitting: Internal Medicine

## 2014-02-27 ENCOUNTER — Other Ambulatory Visit (INDEPENDENT_AMBULATORY_CARE_PROVIDER_SITE_OTHER): Payer: Medicare Other

## 2014-02-27 VITALS — BP 138/66 | HR 68 | Ht 65.0 in | Wt 150.4 lb

## 2014-02-27 DIAGNOSIS — R768 Other specified abnormal immunological findings in serum: Secondary | ICD-10-CM

## 2014-02-27 DIAGNOSIS — R894 Abnormal immunological findings in specimens from other organs, systems and tissues: Secondary | ICD-10-CM

## 2014-02-27 DIAGNOSIS — R141 Gas pain: Secondary | ICD-10-CM

## 2014-02-27 DIAGNOSIS — R142 Eructation: Secondary | ICD-10-CM

## 2014-02-27 DIAGNOSIS — R143 Flatulence: Secondary | ICD-10-CM

## 2014-02-27 LAB — IGA: IgA: 120 mg/dL (ref 68–378)

## 2014-02-27 NOTE — Progress Notes (Signed)
Marilyn [name] [birth year] [id number]  Note: This dictation was prepared with Dragon digital system. Any transcriptional errors that result from this procedure are unintentional.   History of Present Illness:  This is a 73 year old white female who has irritable bowel syndrome and positive sprue marker antigliagin antibogy IgG but normal small bowel biopsies. She comes today with complaints of flatulence and gassiness which are at times embarrasing. She has episodes of large amounts of gas despite following a high-fiber diet with dietary supplements of fiber on a daily basis. She has severe diverticulosis of the sigmoid colon based on a colonoscopy in January 2014. She also has decreased rectal sphincter tone. She is taking Benefiber daily. There is a history of an enterocele repair. An upper endoscopy in January 2014 showed a mild esophageal stricture which was dilated with 14, 15 and 16 mm dilators. There was a 4 cm hiatal hernia , there was no evidence of Barrett's esophagus. Biopsies of the stomach showed reactive gastropathy which was most likely related to ibuprofen. She is on Fosamax for osteopenia. She is also on Protonix 40 mg daily but denies any heartburn or regurgitation.    Past Medical History  Diagnosis Date  . Hypertension   . Breast cancer     stage 1; right  . Diverticulosis   . Hypertension   . Cataracts, bilateral   . IBS (irritable bowel syndrome)   . Hernia   . Esophageal stricture   . Reflux esophagitis   . Senile osteoporosis   . Lumbago   . Spasm of muscle   . Abnormality of gait   . Unspecified hypothyroidism   . Altered mental status   . Alopecia, unspecified   . Obsessive-compulsive personality disorder   . Mixed hearing loss, bilateral   . Other and unspecified hyperlipidemia   . Migraine with aura, without mention of intractable migraine without mention of status migrainosus   . Unspecified constipation   . Osteoarthrosis, unspecified whether generalized  or localized, unspecified site   . Plantar fascial fibromatosis   . Enterocele   . Irritable bowel syndrome   . Regional enteritis of small intestine   . Esophageal stricture 07/01/12  . Decreased rectal sphincter tone 07/01/12  . Hiatal hernia   . Rectal prolapse     Past Surgical History  Procedure Laterality Date  . Hernia repair    . Appendectomy    . Strabisumus eye surgery      x 2  . Oophorectomy    . Enterocele repair    . Breast lumpectomy Right     snbx, apbi  . Tonsillectomy    . Basal cell carcinoma excision  1978    neck  . Dilation and curettage of uterus    . Hair transplant  2008  . Dermatolfibroma  2012  . Cataract removal od  06/28/2012   . Cataract removal os  12/13/2012    Allergies  Allergen Reactions  . Cetacaine [Butamben-Tetracaine-Benzocaine]     "loss of taste"    Family history and social history have been reviewed.  Review of Systems: Denies dysphagia. Has occasional fullness in the chest as she eats., Positive for bloating  The remainder of the 10 point ROS is negative except as outlined in the H&P  Physical Exam: General Appearance Well developed, in no distress Eyes  Non icteric  HEENT  Non traumatic, normocephalic  Mouth No lesion, tongue papillated, no cheilosis Neck Supple without adenopathy, thyroid not enlarged, no carotid bruits, no JVD  Lungs Clear to auscultation bilaterally COR Normal S1, normal S2, regular rhythm, no murmur, quiet precordium Abdomen mildly distended and tympanitic. Nontender. Normoactive bowel sounds. Rectal not done Extremities  No pedal edema Skin No lesions Neurological Alert and oriented x 3 Psychological Normal mood and affect  Assessment and Plan:   Problem #23 73 year old white female with a history of gastroesophageal reflux and 4 cm hiatal hernia. She was dilated in January 2014 but has no recurrence of dysphagia. She will decrease the Protonix to 40 mg every other day and follow antireflux  measures.This will have a positive effect on possible malabsorption of calcium associated with acid suppression.  Problem #2 Severe diverticulosis of the sigmoid colon with partial narrowing and tortuosity documented on colonoscopy in January 2014. She has a  flatulence and gassiness. She will continue Benefiber 1-2 teaspoons daily, Levsin sublingually 0.125 mg when necessary and a probiotic daily. She will decrease her intake of gluten in view of the positive markers. We will repeat the sprue panel today. There is a question of Fosamax causing GI intolerance. She will discuss this with Dr. Jana Hakim as to any alternative treatment.     Delfin Edis 02/27/2014

## 2014-02-27 NOTE — Patient Instructions (Addendum)
Your physician has requested that you go to the basement for the following lab work before leaving today: Celiac 10, IgA Dr Jana Hakim,  Dr Toni Amend Nyoka Cowden

## 2014-02-28 ENCOUNTER — Encounter: Payer: Self-pay | Admitting: Internal Medicine

## 2014-02-28 ENCOUNTER — Telehealth: Payer: Self-pay | Admitting: *Deleted

## 2014-02-28 DIAGNOSIS — R894 Abnormal immunological findings in specimens from other organs, systems and tissues: Secondary | ICD-10-CM | POA: Insufficient documentation

## 2014-02-28 LAB — CELIAC PANEL 10
Endomysial Screen: NEGATIVE
GLIADIN IGA: 6.6 U/mL (ref <20)
GLIADIN IGG: 30 U/mL — AB (ref <20)
IgA: 124 mg/dL (ref 69–380)
TISSUE TRANSGLUT AB: 8.9 U/mL (ref <20)
TISSUE TRANSGLUTAMINASE AB, IGA: 3.8 U/mL (ref <20)

## 2014-02-28 NOTE — Telephone Encounter (Signed)
Called patient regarding Bone Density Results from [facility] 01/31/2014. Per Dr. Leo Grosser has been no significant interval change compared to exam of 01/11/2012 Patient Notified and agreed and made an appointment for flu shot.

## 2014-03-01 ENCOUNTER — Other Ambulatory Visit: Payer: Medicare Other

## 2014-03-02 ENCOUNTER — Ambulatory Visit (INDEPENDENT_AMBULATORY_CARE_PROVIDER_SITE_OTHER): Payer: Medicare Other

## 2014-03-02 DIAGNOSIS — Z23 Encounter for immunization: Secondary | ICD-10-CM

## 2014-03-08 ENCOUNTER — Ambulatory Visit: Payer: Medicare Other | Admitting: Oncology

## 2014-03-08 ENCOUNTER — Other Ambulatory Visit: Payer: Medicare Other

## 2014-03-08 ENCOUNTER — Other Ambulatory Visit (HOSPITAL_BASED_OUTPATIENT_CLINIC_OR_DEPARTMENT_OTHER): Payer: Medicare Other

## 2014-03-08 DIAGNOSIS — C50411 Malignant neoplasm of upper-outer quadrant of right female breast: Secondary | ICD-10-CM

## 2014-03-08 DIAGNOSIS — C50919 Malignant neoplasm of unspecified site of unspecified female breast: Secondary | ICD-10-CM

## 2014-03-08 LAB — CBC WITH DIFFERENTIAL/PLATELET
BASO%: 0.6 % (ref 0.0–2.0)
Basophils Absolute: 0 10*3/uL (ref 0.0–0.1)
EOS%: 1.3 % (ref 0.0–7.0)
Eosinophils Absolute: 0.1 10*3/uL (ref 0.0–0.5)
HCT: 40.2 % (ref 34.8–46.6)
HGB: 12.9 g/dL (ref 11.6–15.9)
LYMPH#: 1 10*3/uL (ref 0.9–3.3)
LYMPH%: 21.9 % (ref 14.0–49.7)
MCH: 31.3 pg (ref 25.1–34.0)
MCHC: 32.1 g/dL (ref 31.5–36.0)
MCV: 97.6 fL (ref 79.5–101.0)
MONO#: 0.4 10*3/uL (ref 0.1–0.9)
MONO%: 9.2 % (ref 0.0–14.0)
NEUT#: 3.1 10*3/uL (ref 1.5–6.5)
NEUT%: 67 % (ref 38.4–76.8)
Platelets: 310 10*3/uL (ref 145–400)
RBC: 4.12 10*6/uL (ref 3.70–5.45)
RDW: 12.8 % (ref 11.2–14.5)
WBC: 4.7 10*3/uL (ref 3.9–10.3)

## 2014-03-09 LAB — VITAMIN D 25 HYDROXY (VIT D DEFICIENCY, FRACTURES): Vit D, 25-Hydroxy: 68 ng/mL (ref 30–89)

## 2014-03-13 ENCOUNTER — Telehealth: Payer: Self-pay | Admitting: Oncology

## 2014-03-13 ENCOUNTER — Ambulatory Visit (HOSPITAL_BASED_OUTPATIENT_CLINIC_OR_DEPARTMENT_OTHER): Payer: Medicare Other | Admitting: Oncology

## 2014-03-13 VITALS — BP 150/60 | HR 78 | Temp 98.1°F | Resp 18 | Ht 65.0 in | Wt 151.7 lb

## 2014-03-13 DIAGNOSIS — C50411 Malignant neoplasm of upper-outer quadrant of right female breast: Secondary | ICD-10-CM

## 2014-03-13 DIAGNOSIS — Z170 Estrogen receptor positive status [ER+]: Secondary | ICD-10-CM

## 2014-03-13 DIAGNOSIS — M8580 Other specified disorders of bone density and structure, unspecified site: Secondary | ICD-10-CM | POA: Insufficient documentation

## 2014-03-13 MED ORDER — TAMOXIFEN CITRATE 20 MG PO TABS: ORAL_TABLET | ORAL | Status: DC

## 2014-03-13 NOTE — Progress Notes (Signed)
ID: Conard Novak   DOB: [birth year]  MR#: [id number]  CSN#:[id number]  PCP: Estill Dooms, MD GYN:  SUCatalina Antigua. Donne Hazel OTHER MD: Rolm Bookbinder, [place] Sous   HISTORY OF PRESENT ILLNESS: From the original intake note:  Marilyn [name] had routine screening mammography June 17, 2009, showing dense breast tissue with new calcifications in the upper quadrant on the right breast.  She was recalled on January 12 for diagnostic right mammography and this suggested a benign area of calcification; however, posterior to these there was a set of new microcalcifications and biopsy was recommended.  This was performed on July 09, 2009, and showed ([id number]) an invasive ductal carcinoma with high-grade ductal carcinoma in situ.  The prognostic profile showed the tumor cells to be 93% estrogen receptor positive, 100% progesterone receptor positive, with a low proliferation marker at 11% and HER-2 negative with a ratio by CISH of 1.03.    With this information, the patient was referred to Dr Donne Hazel and bilateral breast MRIs were obtained February 18.  There were only post-biopsy changes seen in the upper outer quadrant of the right breast.  The left breast and the rest of the scan were unremarkable.  With this information, Dr Donne Hazel proceeded to left lumpectomy and sentinel lymph node sampling on August 19, 2009.  The final pathology here ([id number]) showed a 6 mm invasive ductal carcinoma, grade 1, with 0 of 4 sentinel lymph nodes involved.  Margins were negative and there was no evidence of lymphovascular invasion.    The patient was felt to be a good candidate for MammoSite radiation and she was referred to Dr Valere Dross.  After appropriate discussion, the patient agree to MammoSite radiation and this was performed between March 21 and August 30, 2009, the breast receiving 3400 cGy in 10 fractions.   Her subsequent history is as detailed below.  INTERVAL HISTORY: Marilyn [name] returns today for  followup of her breast cancer. She continues on tamoxifen, with a few hot flashes as the only side effect. She is exercising mostly by walking her dog twice a day, and of course she does a good bit of gardening and bird watching which also involves walking. She just got "silver sneakers" and is thinking of perhaps starting going to walk. I strongly encourage that.  REVIEW OF SYSTEMS: Aside from continuing hearing loss, a little bit of hair thinning, and mild urinary stress incontinence, a detailed review of systems today was noncontributory  PAST MEDICAL HISTORY: Past Medical History  Diagnosis Date  . Hypertension   . Breast cancer     stage 1; right  . Diverticulosis   . Hypertension   . Cataracts, bilateral   . IBS (irritable bowel syndrome)   . Hernia   . Esophageal stricture   . Reflux esophagitis   . Senile osteoporosis   . Lumbago   . Spasm of muscle   . Abnormality of gait   . Unspecified hypothyroidism   . Altered mental status   . Alopecia, unspecified   . Obsessive-compulsive personality disorder   . Mixed hearing loss, bilateral   . Other and unspecified hyperlipidemia   . Migraine with aura, without mention of intractable migraine without mention of status migrainosus   . Unspecified constipation   . Osteoarthrosis, unspecified whether generalized or localized, unspecified site   . Plantar fascial fibromatosis   . Enterocele   . Irritable bowel syndrome   . Regional enteritis of small intestine   . Esophageal stricture 07/01/12  .  Decreased rectal sphincter tone 07/01/12  . Hiatal hernia   . Rectal prolapse   1. Hypertension. 2. History of premature atrial arrhythmias. 3. History of Raynaud syndrome. 4. History of irritable bowel syndrome. 5. Status post tonsillectomy. 6. Status post inguinal hernia repair. 7. Status post strabismus repair. 8. Status post appendectomy. 9. Status post basal cell skin cancer removal in 1978. 10. History of D&C in  2003. 11. History of enterocele repair at [facility] with bilateral oophorectomies performed at the same time (1985). 12. Status post removal of a benign breast cyst. Status post remote history of migraines  PAST SURGICAL HISTORY: Past Surgical History  Procedure Laterality Date  . Hernia repair    . Appendectomy    . Strabisumus eye surgery      x 2  . Oophorectomy    . Enterocele repair    . Breast lumpectomy Right     snbx, apbi  . Tonsillectomy    . Basal cell carcinoma excision  1978    neck  . Dilation and curettage of uterus    . Hair transplant  2008  . Dermatolfibroma  2012  . Cataract removal od  06/28/2012   . Cataract removal os  12/13/2012    FAMILY HISTORY Family History  Problem Relation Age of Onset  . Breast cancer Maternal Aunt   . Hypertension Mother   . Stroke Mother   . Alzheimer's disease Father   . Colon cancer Neg Hx   . Hypertension Brother   . Gallbladder disease Brother    The patient's father died with Alzheimer disease at the age of 61.  The patient's mother died at the age of 75 from a stroke; she was a smoker.  The patient has 1 brother and 1 sister; as far as she knows they are in good health.  There is no significant history of breast cancer in the family other than 2 maternal aunts who had breast cancer in their 75s.  GYNECOLOGIC HISTORY: The patient is G0.  She was on Prempro for several years until February 2011  SOCIAL HISTORY: She worked for the [employer] for many years and also taught at [employer].  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  She also does a lot of bird watching.  Her 2 dogs currently are a Holiday representative and a terrier mix.   ADVANCED DIRECTIVES: in place; her HCPOA is Marilyn [name], who can be reached at [place]: History  Substance Use Topics  . Smoking status: Never Smoker   . Smokeless tobacco: Never Used  . Alcohol Use: No      Colonoscopy:  PAP:  Bone density: At [facility] 01/31/2014, showing a T score of -2.0 (no change from 2 years prior)  Lipid panel:  Allergies  Allergen Reactions  . Cetacaine [Butamben-Tetracaine-Benzocaine]     "loss of taste"    Current Outpatient Prescriptions  Medication Sig Dispense Refill  . acetaminophen (TYLENOL) 500 MG tablet Take 1 tablets every 6-8 hours as needed for pain but no more than 6 tablets in a day      . alendronate (FOSAMAX) 70 MG tablet TAKE 1 TABLET BY MOUTH EVERY 7 DAYS WITH A GLAS OF WATER ON AN EMPTY STOMACH  15 tablet  5  . amLODipine (NORVASC) 10 MG tablet ONE DAILY TO CONTROL BP  90 tablet  3  . aspirin 81 MG tablet Take 81 mg by mouth daily.      [name]  AVODART 0.5 MG capsule Take 0.5 mg by mouth once a week.       . Calcium Carbonate-Vitamin D (CALCIUM 600 + D PO) Take 1 tablet by mouth daily.      . Calcium Polycarbophil ([employer] FIBER LAXATIVE PO) Take 2 tablets by mouth daily.      . cholecalciferol (VITAMIN D) 1000 UNITS tablet Take 1,000 Units by mouth daily.      [name] docusate sodium (COLACE) 100 MG capsule Take 100 mg by mouth daily.      . flutamide (EULEXIN) 125 MG capsule TAKE 2 CAPSULES EVERY DAY  60 capsule  1  . hyoscyamine (LEVSIN/SL) 0.125 MG SL tablet Place 1 tab on the tongue to dissolve as needed for urgency, diarrhea.  30 tablet  2  . Multiple Vitamins-Minerals (MULTIVITAMIN & MINERAL PO) Take 1 tablet by mouth daily.      . pantoprazole (PROTONIX) 40 MG tablet Take 1 tablet (40 mg total) by mouth daily.  90 tablet  3  . Probiotic Product (PROBIOTIC DAILY PO) Take by mouth daily.      . Selenium 200 MCG CAPS Take 1 capsule by mouth daily.      . tamoxifen (NOLVADEX) 20 MG tablet TAKE 1 TABLET BY MOUTH EVERY DAY  90 tablet  4  . traMADol (ULTRAM) 50 MG tablet One every 6 hours if needed for pain  60 tablet  0  . vitamin C (ASCORBIC ACID) 500 MG tablet Take 500 mg by mouth daily.      . vitamin E 400 UNIT capsule Take 400 Units by mouth daily.       . Wheat Dextrin (BENEFIBER) POWD Take 1 each by mouth daily.       No current facility-administered medications for this visit.    OBJECTIVE: Middle-aged white woman who appears well Filed Vitals:   03/13/14 1059  BP: 150/60  Pulse: 78  Temp: 98.1 F (36.7 C)  Resp: 18     Body mass index is 25.24 kg/(m^2).    ECOG FS: 0  Sclerae unicteric, pupils round and equal Oropharynx clear, teeth in good repair No cervical or supraclavicular adenopathy Lungs no rales or rhonchi Heart regular rate and rhythm Abd soft, nontender, positive bowel sounds MSK no focal spinal tenderness, no upper extremity lymphedema Neuro: nonfocal, well oriented, positive affect Breasts: The right breast is status post lumpectomy and MammoSite radiation. There is no evidence of local recurrence. The right axilla is benign. The left breast is unremarkable  LAB RESULTS: Lab Results  Component Value Date   WBC 4.7 03/08/2014   NEUTROABS 3.1 03/08/2014   HGB 12.9 03/08/2014   HCT 40.2 03/08/2014   MCV 97.6 03/08/2014   PLT 310 03/08/2014      Chemistry      Component Value Date/Time   NA 141 06/14/2013 1528   NA 140 02/25/2010 1356   K 4.3 06/14/2013 1528   CL 100 06/14/2013 1528   CO2 26 06/14/2013 1528   BUN 18 06/14/2013 1528   BUN 19 02/25/2010 1356   CREATININE 0.82 06/14/2013 1528      Component Value Date/Time   CALCIUM 9.5 06/14/2013 1528   ALKPHOS 53 06/14/2013 1528   AST 16 06/14/2013 1528   ALT 12 06/14/2013 1528   BILITOT <0.2 06/14/2013 1528       Lab Results  Component Value Date   LABCA2 17 08/14/2009    No components found with this basename: [id number]    No results  found for this basename: INR,  in the last 168 hours  Urinalysis No results found for this basename: colorurine,  appearanceur,  labspec,  phurine,  glucoseu,  hgbur,  bilirubinur,  ketonesur,  proteinur,  urobilinogen,  nitrite,  leukocytesur    STUDIES: Mammography January 2015 was benign. DEXA scan in August 2015 showed a T  score of -2.0 (slightly improved)  ASSESSMENT: 73 y.o. [place] woman status post right lumpectomy and sentinel lymph node biopsy March 2011 for a T1b N0 grade 1 invasive ductal carcinoma, strongly estrogen and progesterone receptor positive with an MIB-1 of 11% and HER2 not amplified.   (1) status post  MammoSite radiation  (2) started tamoxifen in late March 2011  (3) with significant osteopenia, started on alendronate October 2013    PLAN:  Marilyn [name] is doing well from a breast cancer point of view, minimal a little over 4 years from her definitive surgery with no evidence of disease recurrence. She is tolerating the tamoxifen without significant side effects. She is also tolerating the alendronate without complaints.  I am going to see her one more time, a year from now, and at that point we will stop the tamoxifen and discontinue followup.  I think it is fine for her to stop the calcium supplementation. I do think it is a good idea for her to continue vitamin D supplementation and I think 500 mg daily should be enough for her.  MAGRINAT,GUSTAV C    03/13/2014

## 2014-03-13 NOTE — Telephone Encounter (Signed)
per pof to sch pt appt-gave pt copy of sch °

## 2014-03-20 ENCOUNTER — Other Ambulatory Visit: Payer: Self-pay | Admitting: Oncology

## 2014-04-03 ENCOUNTER — Other Ambulatory Visit: Payer: Self-pay | Admitting: Oncology

## 2014-04-20 ENCOUNTER — Other Ambulatory Visit: Payer: Medicare Other

## 2014-04-20 DIAGNOSIS — E785 Hyperlipidemia, unspecified: Secondary | ICD-10-CM

## 2014-04-20 DIAGNOSIS — I10 Essential (primary) hypertension: Secondary | ICD-10-CM

## 2014-04-20 DIAGNOSIS — E039 Hypothyroidism, unspecified: Secondary | ICD-10-CM

## 2014-04-21 LAB — COMPREHENSIVE METABOLIC PANEL
A/G RATIO: 1.8 (ref 1.1–2.5)
ALT: 10 IU/L (ref 0–32)
AST: 18 IU/L (ref 0–40)
Albumin: 4.2 g/dL (ref 3.5–4.8)
Alkaline Phosphatase: 58 IU/L (ref 39–117)
BUN/Creatinine Ratio: 20 (ref 11–26)
BUN: 16 mg/dL (ref 8–27)
CALCIUM: 8.7 mg/dL (ref 8.7–10.3)
CO2: 25 mmol/L (ref 18–29)
CREATININE: 0.79 mg/dL (ref 0.57–1.00)
Chloride: 102 mmol/L (ref 97–108)
GFR calc Af Amer: 85 mL/min/{1.73_m2} (ref >59)
GFR calc non Af Amer: 74 mL/min/{1.73_m2} (ref >59)
GLOBULIN, TOTAL: 2.4 g/dL (ref 1.5–4.5)
Glucose: 82 mg/dL (ref 65–99)
Potassium: 4.2 mmol/L (ref 3.5–5.2)
SODIUM: 141 mmol/L (ref 134–144)
Total Bilirubin: 0.4 mg/dL (ref 0.0–1.2)
Total Protein: 6.6 g/dL (ref 6.0–8.5)

## 2014-04-21 LAB — LIPID PANEL
CHOL/HDL RATIO: 3.1 ratio (ref 0.0–4.4)
CHOLESTEROL TOTAL: 190 mg/dL (ref 100–199)
HDL: 62 mg/dL (ref >39)
LDL Calculated: 111 mg/dL — ABNORMAL HIGH (ref 0–99)
TRIGLYCERIDES: 83 mg/dL (ref 0–149)
VLDL Cholesterol Cal: 17 mg/dL (ref 5–40)

## 2014-04-21 LAB — TSH: TSH: 5.74 u[IU]/mL — ABNORMAL HIGH (ref 0.450–4.500)

## 2014-04-23 ENCOUNTER — Telehealth: Payer: Self-pay | Admitting: *Deleted

## 2014-04-23 DIAGNOSIS — S30860A Insect bite (nonvenomous) of lower back and pelvis, initial encounter: Secondary | ICD-10-CM | POA: Insufficient documentation

## 2014-04-23 DIAGNOSIS — W57XXXA Bitten or stung by nonvenomous insect and other nonvenomous arthropods, initial encounter: Secondary | ICD-10-CM

## 2014-04-23 NOTE — Telephone Encounter (Signed)
Patient called and stated that she pulled a tick off of her and she looked and it resembles a bulls eye. Patient has an appointment tomorrow at 2:00 with Dr. Nyoka Cowden and wanted to know if it would be ok to wait till then to be seen. I called Dr. Nyoka Cowden and she stated that would be fine. Patient agreed.

## 2014-04-24 ENCOUNTER — Other Ambulatory Visit: Payer: Self-pay | Admitting: *Deleted

## 2014-04-24 ENCOUNTER — Ambulatory Visit (INDEPENDENT_AMBULATORY_CARE_PROVIDER_SITE_OTHER): Payer: Medicare Other | Admitting: Internal Medicine

## 2014-04-24 ENCOUNTER — Encounter: Payer: Self-pay | Admitting: Internal Medicine

## 2014-04-24 VITALS — BP 120/80 | HR 67 | Temp 97.5°F | Ht 65.0 in | Wt 150.8 lb

## 2014-04-24 DIAGNOSIS — E785 Hyperlipidemia, unspecified: Secondary | ICD-10-CM

## 2014-04-24 DIAGNOSIS — E039 Hypothyroidism, unspecified: Secondary | ICD-10-CM

## 2014-04-24 DIAGNOSIS — H9192 Unspecified hearing loss, left ear: Secondary | ICD-10-CM

## 2014-04-24 DIAGNOSIS — W57XXXA Bitten or stung by nonvenomous insect and other nonvenomous arthropods, initial encounter: Secondary | ICD-10-CM

## 2014-04-24 DIAGNOSIS — S30860A Insect bite (nonvenomous) of lower back and pelvis, initial encounter: Secondary | ICD-10-CM

## 2014-04-24 DIAGNOSIS — C50411 Malignant neoplasm of upper-outer quadrant of right female breast: Secondary | ICD-10-CM

## 2014-04-24 DIAGNOSIS — K589 Irritable bowel syndrome without diarrhea: Secondary | ICD-10-CM

## 2014-04-24 DIAGNOSIS — K222 Esophageal obstruction: Secondary | ICD-10-CM

## 2014-04-24 DIAGNOSIS — I10 Essential (primary) hypertension: Secondary | ICD-10-CM

## 2014-04-24 MED ORDER — HYOSCYAMINE SULFATE 0.125 MG SL SUBL: SUBLINGUAL_TABLET | SUBLINGUAL | Status: DC

## 2014-04-24 NOTE — Progress Notes (Signed)
PATIENT FAILED CLOCK TEST.

## 2014-04-24 NOTE — Progress Notes (Signed)
Patient ID: Marilyn [name], female   DOB: Apr 13, 1941, 73 y.o.   MRN: [id number]     HISTORY AND PHYSICAL  Location:    [facility]   Place of Service:   OFFICE  Extended Emergency Contact Information Primary Emergency Contact: Underkoffer,Judie Address: [place] Phone: [phone] Relation: Friend   Chief Complaint  Patient presents with  . Annual Exam    Yearly Physical    HPI:  Saw Dr. Benjamine Mola about the extra sounds in the left ear. Has used prednisone and nasal steroids. Does not help.  Episodes of dysequilibium in the last two weeks. Has to reach out and hold something to stabilize herself. Last occurrence was last night after she got out of a recliner. Seems better now.  Past Medical History  Diagnosis Date  . Hypertension   . Breast cancer     stage 1; right  . Diverticulosis   . Hypertension   . Cataracts, bilateral   . IBS (irritable bowel syndrome)   . Hernia   . Esophageal stricture   . Reflux esophagitis   . Senile osteoporosis   . Lumbago   . Spasm of muscle   . Abnormality of gait   . Unspecified hypothyroidism   . Altered mental status   . Alopecia, unspecified   . Obsessive-compulsive personality disorder   . Mixed hearing loss, bilateral   . Other and unspecified hyperlipidemia   . Migraine with aura, without mention of intractable migraine without mention of status migrainosus   . Unspecified constipation   . Osteoarthrosis, unspecified whether generalized or localized, unspecified site   . Plantar fascial fibromatosis   . Enterocele   . Irritable bowel syndrome   . Regional enteritis of small intestine   . Esophageal stricture 07/01/12  . Decreased rectal sphincter tone 07/01/12  . Hiatal hernia   . Rectal prolapse     Past Surgical History  Procedure Laterality Date  . Hernia repair    . Appendectomy    . Strabisumus eye surgery      x 2  . Oophorectomy    . Enterocele repair    . Breast lumpectomy Right      snbx, apbi  . Tonsillectomy    . Basal cell carcinoma excision  1978    neck  . Dilation and curettage of uterus    . Hair transplant  2008  . Dermatolfibroma  2012  . Cataract removal od  06/28/2012   . Cataract removal os  12/13/2012    Patient Care Team: Estill Dooms, MD as PCP - General (Internal Medicine) Amy Carles Collet, MD (Dermatology)  History   Social History  . Marital Status: Single    Spouse Name: N/A    Number of Children: 0  . Years of Education: N/A   Occupational History  . Not on file.   Social History Main Topics  . Smoking status: Never Smoker   . Smokeless tobacco: Never Used  . Alcohol Use: No  . Drug Use: No  . Sexual Activity: Not on file   Other Topics Concern  . Not on file   Social History Narrative     reports that she has never smoked. She has never used smokeless tobacco. She reports that she does not drink alcohol or use illicit drugs.  Family History  Problem Relation Age of Onset  . Breast cancer Maternal Aunt   . Hypertension Mother   . Stroke Mother   .  Alzheimer's disease Father   . Colon cancer Neg Hx   . Hypertension Brother   . Gallbladder disease Brother    Family Status  Relation Status Death Age  . Mother Deceased 86    stroke  . Father Deceased 67    Alzheimers  . Sister Alive   . Brother Marketing executive History  Administered Date(s) Administered  . DTaP 06/08/1996  . Hepatitis A 10/18/1996, 11/15/1997  . Influenza,inj,Quad PF,36+ Mos 03/01/2013, 03/02/2014  . Pneumococcal-Unspecified 05/07/2010  . Td 09/04/1981, 02/11/1993, 12/07/2011  . Yellow Fever 11/08/1987  . Zoster 11/30/2006    Allergies  Allergen Reactions  . Cetacaine [Butamben-Tetracaine-Benzocaine]     "loss of taste"    Medications: Patient's Medications  New Prescriptions   No medications on file  Previous Medications   ACETAMINOPHEN (TYLENOL) 500 MG TABLET    Take 1 tablets every 6-8 hours as needed for pain but  no more than 6 tablets in a day   ALENDRONATE (FOSAMAX) 70 MG TABLET    TAKE 1 TABLET BY MOUTH EVERY 7 DAYS WITH A GLAS OF WATER ON AN EMPTY STOMACH   AMLODIPINE (NORVASC) 10 MG TABLET    ONE DAILY TO CONTROL BP   ASPIRIN 81 MG TABLET    Take 81 mg by mouth daily.   AVODART 0.5 MG CAPSULE    Take 0.5 mg by mouth once a week.    CALCIUM POLYCARBOPHIL ([employer] FIBER LAXATIVE PO)    Take 2 tablets by mouth daily.   CHOLECALCIFEROL (VITAMIN D) 1000 UNITS TABLET    Take 1,000 Units by mouth daily.   DOCUSATE SODIUM (COLACE) 100 MG CAPSULE    Take 100 mg by mouth daily.   FLUTAMIDE (EULEXIN) 125 MG CAPSULE    TAKE 2 CAPSULES EVERY DAY   FLUTICASONE (FLONASE) 50 MCG/ACT NASAL SPRAY    Place 2 sprays into both nostrils daily.   FLUTICASONE (VERAMYST) 27.5 MCG/SPRAY NASAL SPRAY    Place 2 sprays into the nose as needed for rhinitis.   HYOSCYAMINE (LEVSIN/SL) 0.125 MG SL TABLET    Place 1 tab on the tongue to dissolve as needed for urgency, diarrhea.   MULTIPLE VITAMINS-MINERALS (MULTIVITAMIN & MINERAL PO)    Take 1 tablet by mouth daily.   PANTOPRAZOLE (PROTONIX) 40 MG TABLET    Take 1 tablet (40 mg total) by mouth daily.   PROBIOTIC PRODUCT (PROBIOTIC DAILY PO)    Take by mouth daily.   SELENIUM 200 MCG CAPS    Take 1 capsule by mouth daily.   TAMOXIFEN (NOLVADEX) 20 MG TABLET    TAKE 1 TABLET BY MOUTH EVERY DAY   VITAMIN C (ASCORBIC ACID) 500 MG TABLET    Take 500 mg by mouth daily.   VITAMIN E 400 UNIT CAPSULE    Take 400 Units by mouth daily.   WHEAT DEXTRIN (BENEFIBER) POWD    Take 1 each by mouth daily.  Modified Medications   No medications on file  Discontinued Medications   CALCIUM CARBONATE-VITAMIN D (CALCIUM 600 + D PO)    Take 1 tablet by mouth daily.   TRAMADOL (ULTRAM) 50 MG TABLET    One every 6 hours if needed for pain    Review of Systems  Constitutional: Negative for fever, activity change, appetite change, fatigue and unexpected weight change.       History of breast cancer.    HENT: Negative.   Eyes: Positive for visual disturbance (corrective lenses).  Cardiovascular:  Negative.   Gastrointestinal:       Hx IBS  Endocrine: Negative.   Genitourinary: Negative.   Musculoskeletal: Negative.   Skin:       Technique mild of the lower right side of the back. Occurred 04/23/14.  Neurological:       Some difficulty with balance  Hematological: Negative.   Psychiatric/Behavioral:       Hx of mild obsessive and hoarding disorder    Filed Vitals:   04/24/14 1359  BP: 120/80  Pulse: 67  Temp: 97.5 F (36.4 C)  TempSrc: Oral  Height: 5\' 5"  (1.651 m)  Weight: 150 lb 12.8 oz (68.402 kg)  SpO2: 98%   Body mass index is 25.09 kg/(m^2).  Physical Exam  Constitutional: She is oriented to person, place, and time. She appears well-developed and well-nourished. No distress.  HENT:  Head: Normocephalic and atraumatic.  Right Ear: External ear normal.  Left Ear: External ear normal.  Nose: Nose normal.  Mouth/Throat: Oropharynx is clear and moist.  Eyes: Conjunctivae and EOM are normal. Pupils are equal, round, and reactive to light.  Mild pupil irregularity due to cataract extraction of the right eye  Neck: No JVD present. No tracheal deviation present. No thyromegaly present.  Cardiovascular: Normal rate, regular rhythm, normal heart sounds and intact distal pulses.  Exam reveals no gallop and no friction rub.   No murmur heard. Pulmonary/Chest: No respiratory distress. She has no wheezes. She has no rales.  Abdominal: She exhibits no distension and no mass. There is no tenderness.  Genitourinary:  Done by Dr. Dory Horn  Musculoskeletal: Normal range of motion. She exhibits no edema or tenderness.  Lymphadenopathy:    She has no cervical adenopathy.  Neurological: She is alert and oriented to person, place, and time. She has normal reflexes. No cranial nerve deficit. Coordination normal.  04/24/14 MMSE 29/30. Failed clock drawing.  Skin: No rash noted.   Tick bite of the lower right-sided back. Occurred 04/23/14. Central hemorrhagic dark area. Surrounding mild edema. No welts. Does not feel warm. Not tender.  Psychiatric: She has a normal mood and affect. Her behavior is normal. Thought content normal.     Labs reviewed: Appointment on 04/20/2014  Component Date Value Ref Range Status  . Cholesterol, Total 04/20/2014 190  100 - 199 mg/dL Final  . Triglycerides 04/20/2014 83  0 - 149 mg/dL Final  . HDL 04/20/2014 62  >39 mg/dL Final   Comment: According to ATP-III Guidelines, HDL-C >59 mg/dL is considered a negative risk factor for CHD.   [name] VLDL Cholesterol Cal 04/20/2014 17  5 - 40 mg/dL Final  . LDL Calculated 04/20/2014 111* 0 - 99 mg/dL Final  . Chol/HDL Ratio 04/20/2014 3.1  0.0 - 4.4 ratio units Final   Comment:                                   T. Chol/HDL Ratio                                             Men  Women                               1/2 Avg.Risk  3.4    3.3  Avg.Risk  5.0    4.4                                2X Avg.Risk  9.6    7.1                                3X Avg.Risk 23.4   11.0   . Glucose 04/20/2014 82  65 - 99 mg/dL Final  . BUN 04/20/2014 16  8 - 27 mg/dL Final  . Creatinine, Ser 04/20/2014 0.79  0.57 - 1.00 mg/dL Final  . GFR calc non Af Amer 04/20/2014 74  >59 mL/min/1.73 Final  . GFR calc Af Amer 04/20/2014 85  >59 mL/min/1.73 Final  . BUN/Creatinine Ratio 04/20/2014 20  11 - 26 Final  . Sodium 04/20/2014 141  134 - 144 mmol/L Final  . Potassium 04/20/2014 4.2  3.5 - 5.2 mmol/L Final  . Chloride 04/20/2014 102  97 - 108 mmol/L Final  . CO2 04/20/2014 25  18 - 29 mmol/L Final  . Calcium 04/20/2014 8.7  8.7 - 10.3 mg/dL Final  . Total Protein 04/20/2014 6.6  6.0 - 8.5 g/dL Final  . Albumin 04/20/2014 4.2  3.5 - 4.8 g/dL Final  . Globulin, Total 04/20/2014 2.4  1.5 - 4.5 g/dL Final  . Albumin/Globulin Ratio 04/20/2014 1.8  1.1 - 2.5 Final  . Total Bilirubin  04/20/2014 0.4  0.0 - 1.2 mg/dL Final  . Alkaline Phosphatase 04/20/2014 58  39 - 117 IU/L Final  . AST 04/20/2014 18  0 - 40 IU/L Final  . ALT 04/20/2014 10  0 - 32 IU/L Final  . TSH 04/20/2014 5.740* 0.450 - 4.500 uIU/mL Final  Appointment on 03/08/2014  Component Date Value Ref Range Status  . WBC 03/08/2014 4.7  3.9 - 10.3 10e3/uL Final  . NEUT# 03/08/2014 3.1  1.5 - 6.5 10e3/uL Final  . HGB 03/08/2014 12.9  11.6 - 15.9 g/dL Final  . HCT 03/08/2014 40.2  34.8 - 46.6 % Final  . Platelets 03/08/2014 310  145 - 400 10e3/uL Final  . MCV 03/08/2014 97.6  79.5 - 101.0 fL Final  . MCH 03/08/2014 31.3  25.1 - 34.0 pg Final  . MCHC 03/08/2014 32.1  31.5 - 36.0 g/dL Final  . RBC 03/08/2014 4.12  3.70 - 5.45 10e6/uL Final  . RDW 03/08/2014 12.8  11.2 - 14.5 % Final  . lymph# 03/08/2014 1.0  0.9 - 3.3 10e3/uL Final  . MONO# 03/08/2014 0.4  0.1 - 0.9 10e3/uL Final  . Eosinophils Absolute 03/08/2014 0.1  0.0 - 0.5 10e3/uL Final  . Basophils Absolute 03/08/2014 0.0  0.0 - 0.1 10e3/uL Final  . NEUT% 03/08/2014 67.0  38.4 - 76.8 % Final  . LYMPH% 03/08/2014 21.9  14.0 - 49.7 % Final  . MONO% 03/08/2014 9.2  0.0 - 14.0 % Final  . EOS% 03/08/2014 1.3  0.0 - 7.0 % Final  . BASO% 03/08/2014 0.6  0.0 - 2.0 % Final  . Vit D, 25-Hydroxy 03/08/2014 68  30 - 89 ng/mL Final   Comment: This assay accurately quantifies Vitamin D, which is the sum of the25-Hydroxy forms of Vitamin D2 and D3.  Studies have shown that theoptimum concentration of 25-Hydroxy Vitamin D is 30 ng/mL or higher. Concentrations of Vitamin D between 20 and 29 ng/mL  are considered tobe insufficient and concentrations less than 20 ng/mL are consideredto be deficient for Vitamin D.  Nursing Home on 02/27/2014  Component Date Value Ref Range Status  . HM Dexa Scan 01/31/2014 osteoporosis without change   Final  Appointment on 02/27/2014  Component Date Value Ref Range Status  . IgA 02/27/2014 120  68 - 378 mg/dL  Final      . IgA 02/27/2014 124  69 - 380 mg/dL Final  . Endomysial Screen 02/27/2014 NEGATIVE  NEGATIVE Final  . Tissue Transglutaminase Ab, IgA 02/27/2014 3.8  <20 U/mL Final   Comment:    Reference Range:                                  <20     Negative                                  20-25   Equivocal                                  >25     Positive                                                     Between 2-3% of Celiac patients have selective IgA deficiency. If the                          tTG IgA result is negative but Celiac disease is still suspected,                          total IgA should be measured to identify possible selective IgA                          deficiency and to rule out a false negative. In cases of IgA                          deficiency, measurement of tTG IgG should be considered.                             . Gliadin IgG 02/27/2014 30.0* <20 U/mL Final   Comment:    Reference Range:                                  <20     Negative                                  20-25   Equivocal                                  >25     Positive                             .  Gliadin IgA 02/27/2014 6.6  <20 U/mL Final   Comment:    Reference Range:                                  <20     Negative                                  20-25   Equivocal                                  >25     Positive                             . Tissue Transglut Ab 02/27/2014 8.9  <20 U/mL Final   Comment:    Reference Range:                                  <20     Negative                                  20-25   Equivocal                                  >25     Positive                             Abstract on 02/06/2014  Component Date Value Ref Range Status  . HM Dexa Scan 01/31/2014 [facility]-Bone Density   Final     Assessment/Plan  Breast cancer of upper-outer quadrant of right female breast: no recurrence. This is her fifth year of tamoxifen. She anticipates will be  her last year. Has seen Dr. Jana Hakim recently and received a good report.  Essential hypertension: controlled  IBS: generally improved  Hearing decreased, left: seeing ENT  Hyperlipemia:controlled  Hypothyroidism, unspecified hypothyroidism type -mild elevation in TSH  Esophageal stricture: asymptomatic  Tick bite of the lower back: mild inflammation without fever. No other rash. Denies headache. I don't think we need to treat this with antibiotic at this time. Patient advised to call for fever, headaches, or appearance of rash or significant increase in myalgias. If any of these are reported, I would start her on doxycycline antibiotic.

## 2014-04-30 ENCOUNTER — Ambulatory Visit (INDEPENDENT_AMBULATORY_CARE_PROVIDER_SITE_OTHER): Payer: Medicare Other | Admitting: Internal Medicine

## 2014-04-30 ENCOUNTER — Encounter: Payer: Self-pay | Admitting: Internal Medicine

## 2014-04-30 VITALS — BP 122/78 | HR 65 | Temp 96.4°F | Resp 10 | Ht 64.5 in | Wt 150.4 lb

## 2014-04-30 DIAGNOSIS — H8112 Benign paroxysmal vertigo, left ear: Secondary | ICD-10-CM

## 2014-04-30 NOTE — Progress Notes (Signed)
Patient ID: Marilyn [name], female   DOB: Feb 03, 1941, 73 y.o.   MRN: [id number]   Location:  [facility]  Allergies  Allergen Reactions  . Cetacaine [Butamben-Tetracaine-Benzocaine]     "loss of taste"    Chief Complaint  Patient presents with  . Acute Visit    "Feels like head swimming around." Patient c/o N&V(1 episode of vomiting)    HPI: Patient is a 73 y.o. white female with complicated medical history seen in the office today for an acute visit.  Left ear ringing, been to ear doctor twice and told not an inner ear problem.  Having equilibrium problem.  Thought maybe fluid in eustachian tubes.  No problem with ear drum.  Using flonase spray.  After ringing went away, stopped after a week.  Symptoms then return.  Uses ointment in nose sometimes if gets dry.  Had two episodes before last week, then yesterday am's episode was visiting friend and they'd had breakfast, were watching tv, felt woozy when moved head for about 1 hr.  Sat very still.  Felt light was bothering her temporarily.  Then became nauseated and vomited all of a sudden.  Came up in 3-4 waves.  Still felt weak and wobbly.  Eventually got up and laid down in bedroom for 30 mins.  Got back up and felt better.  Ate some soup in the evening.  Better since.    Plans to go [place] to [place] Wednesday.    Review of Systems:  Review of Systems  Constitutional: Negative for fever.  HENT: Positive for ear pain and tinnitus.   Eyes: Negative for blurred vision and double vision.  Neurological: Positive for dizziness. Negative for tingling, tremors, sensory change, speech change, focal weakness, seizures, loss of consciousness and headaches.     Past Medical History  Diagnosis Date  . Hypertension   . Breast cancer     stage 1; right  . Diverticulosis   . Hypertension   . Cataracts, bilateral   . IBS (irritable bowel syndrome)   . Hernia   . Esophageal stricture   . Reflux  esophagitis   . Senile osteoporosis   . Lumbago   . Spasm of muscle   . Abnormality of gait   . Unspecified hypothyroidism   . Altered mental status   . Alopecia, unspecified   . Obsessive-compulsive personality disorder   . Mixed hearing loss, bilateral   . Other and unspecified hyperlipidemia   . Migraine with aura, without mention of intractable migraine without mention of status migrainosus   . Unspecified constipation   . Osteoarthrosis, unspecified whether generalized or localized, unspecified site   . Plantar fascial fibromatosis   . Enterocele   . Irritable bowel syndrome   . Regional enteritis of small intestine   . Esophageal stricture 07/01/12  . Decreased rectal sphincter tone 07/01/12  . Hiatal hernia   . Rectal prolapse     Past Surgical History  Procedure Laterality Date  . Hernia repair    . Appendectomy    . Strabisumus eye surgery      x 2  . Oophorectomy    . Enterocele repair    . Breast lumpectomy Right     snbx, apbi  . Tonsillectomy    . Basal cell carcinoma excision  1978    neck  . Dilation and curettage of uterus    . Hair transplant  2008  . Dermatolfibroma  2012  .  Cataract removal od  06/28/2012   . Cataract removal os  12/13/2012    Social History:   reports that she has never smoked. She has never used smokeless tobacco. She reports that she does not drink alcohol or use illicit drugs.  Family History  Problem Relation Age of Onset  . Breast cancer Maternal Aunt   . Hypertension Mother   . Stroke Mother   . Alzheimer's disease Father   . Colon cancer Neg Hx   . Hypertension Brother   . Gallbladder disease Brother     Medications: Patient's Medications  New Prescriptions   No medications on file  Previous Medications   ACETAMINOPHEN (TYLENOL) 500 MG TABLET    Take 1 tablets every 6-8 hours as needed for pain but no more than 6 tablets in a day   ALENDRONATE (FOSAMAX) 70 MG TABLET    TAKE 1 TABLET BY MOUTH EVERY 7 DAYS WITH A  GLAS OF WATER ON AN EMPTY STOMACH   AMLODIPINE (NORVASC) 10 MG TABLET    ONE DAILY TO CONTROL BP   ASPIRIN 81 MG TABLET    Take 81 mg by mouth daily.   AVODART 0.5 MG CAPSULE    Take 0.5 mg by mouth once a week.    CALCIUM POLYCARBOPHIL ([employer]S FIBER LAXATIVE PO)    Take 2 tablets by mouth daily.   CHOLECALCIFEROL (VITAMIN D) 1000 UNITS TABLET    Take 1,000 Units by mouth daily.   DOCUSATE SODIUM (COLACE) 100 MG CAPSULE    Take 100 mg by mouth daily.   FLUTAMIDE (EULEXIN) 125 MG CAPSULE    TAKE 2 CAPSULES EVERY DAY   FLUTICASONE (FLONASE) 50 MCG/ACT NASAL SPRAY    Place 2 sprays into both nostrils daily.   FLUTICASONE (VERAMYST) 27.5 MCG/SPRAY NASAL SPRAY    Place 2 sprays into the nose as needed for rhinitis.   HYOSCYAMINE (LEVSIN/SL) 0.125 MG SL TABLET    Dissolve 1 tablet under tongue every 4-6 hours as needed for diarrhea   MULTIPLE VITAMINS-MINERALS (MULTIVITAMIN & MINERAL PO)    Take 1 tablet by mouth daily.   PANTOPRAZOLE (PROTONIX) 40 MG TABLET    Take 1 tablet (40 mg total) by mouth daily.   PROBIOTIC PRODUCT (PROBIOTIC DAILY PO)    Take by mouth daily.   SELENIUM 200 MCG CAPS    Take 1 capsule by mouth daily.   TAMOXIFEN (NOLVADEX) 20 MG TABLET    TAKE 1 TABLET BY MOUTH EVERY DAY   VITAMIN C (ASCORBIC ACID) 500 MG TABLET    Take 500 mg by mouth daily.   VITAMIN E 400 UNIT CAPSULE    Take 400 Units by mouth daily.   WHEAT DEXTRIN (BENEFIBER) POWD    Take 1 each by mouth daily.  Modified Medications   No medications on file  Discontinued Medications   No medications on file   Physical Exam: Filed Vitals:   04/30/14 1526  BP: 122/78  Pulse: 65  Temp: 96.4 F (35.8 C)  TempSrc: Oral  Resp: 10  Height: 5' 4.5" (1.638 m)  Weight: 150 lb 6.4 oz (68.221 kg)  SpO2: 99%  Physical Exam  Constitutional: She is oriented to person, place, and time.  HENT:  Head: Normocephalic and atraumatic.  Right Ear: External ear normal.  Left Ear: External ear normal.  Eyes: EOM are normal.  Pupils are equal, round, and reactive to light.  Cardiovascular: Normal rate, regular rhythm, normal heart sounds and intact distal pulses.  Pulmonary/Chest: Effort normal and breath sounds normal. No respiratory distress.  Neurological: She is alert and oriented to person, place, and time. No cranial nerve deficit.    Labs reviewed: Basic Metabolic Panel:  Recent Labs  06/14/13 1528 04/20/14 0941  NA 141 141  K 4.3 4.2  CL 100 102  CO2 26 25  GLUCOSE 81 82  BUN 18 16  CREATININE 0.82 0.79  CALCIUM 9.5 8.7  TSH  --  5.740*   Liver Function Tests:  Recent Labs  06/14/13 1528 04/20/14 0941  AST 16 18  ALT 12 10  ALKPHOS 53 58  BILITOT <0.2 0.4  PROT 7.0 6.6   No results for input(s): LIPASE, AMYLASE in the last 8760 hours. No results for input(s): AMMONIA in the last 8760 hours. CBC:  Recent Labs  06/14/13 1528 03/08/14 1125  WBC 5.6 4.7  NEUTROABS 3.1 3.1  HGB 13.4 12.9  HCT 39.9 40.2  MCV 94 97.6  PLT 355 310   Lipid Panel:  Recent Labs  06/14/13 1528 04/20/14 0941  HDL 55 62  LDLCALC 116* 111*  TRIG 85 83  CHOLHDL 3.4 3.1    Assessment/Plan 1. Benign paroxysmal positional vertigo, left -discussed Epley maneuver -avoid rapid positional change  Labs/tests ordered:  Will order outpatient therapy if her roommates on vacation (physical therapists) cannot help it and it recurs  Next appt:  Keep as scheduled with Dr. Nyoka Cowden  Tekela Garguilo L. Dymin Dingledine, D.O. [facility] [address], [place] [address] Cell Phone (Mon-Fri 8am-5pm):  [phone] On Call:  [phone] & follow prompts after 5pm & weekends Office Phone:  [phone] Office Fax:  [phone]

## 2014-07-01 ENCOUNTER — Other Ambulatory Visit: Payer: Self-pay | Admitting: Oncology

## 2014-07-01 DIAGNOSIS — C50411 Malignant neoplasm of upper-outer quadrant of right female breast: Secondary | ICD-10-CM

## 2014-07-09 ENCOUNTER — Telehealth: Payer: Self-pay | Admitting: *Deleted

## 2014-07-09 DIAGNOSIS — Z79899 Other long term (current) drug therapy: Secondary | ICD-10-CM

## 2014-07-09 DIAGNOSIS — I10 Essential (primary) hypertension: Secondary | ICD-10-CM

## 2014-07-09 NOTE — Telephone Encounter (Signed)
Patient called and stated that her Dermatologist Amy Leonie Green needs a Liver Profile on patient so she can keep prescribing medication for her. Has been getting a LFT drawn but Dermatologist wants Profile. Ordered profile because patient is about out of her medication.

## 2014-07-12 ENCOUNTER — Other Ambulatory Visit: Payer: 59

## 2014-07-12 DIAGNOSIS — E039 Hypothyroidism, unspecified: Secondary | ICD-10-CM

## 2014-07-12 DIAGNOSIS — Z79899 Other long term (current) drug therapy: Secondary | ICD-10-CM

## 2014-07-12 DIAGNOSIS — I10 Essential (primary) hypertension: Secondary | ICD-10-CM

## 2014-07-13 LAB — HEPATIC FUNCTION PANEL
ALT: 12 IU/L (ref 0–32)
AST: 21 IU/L (ref 0–40)
Albumin: 4.4 g/dL (ref 3.5–4.8)
Alkaline Phosphatase: 54 IU/L (ref 39–117)
BILIRUBIN TOTAL: 0.5 mg/dL (ref 0.0–1.2)
Bilirubin, Direct: 0.13 mg/dL (ref 0.00–0.40)
Total Protein: 6.4 g/dL (ref 6.0–8.5)

## 2014-07-13 LAB — TSH: TSH: 4.67 u[IU]/mL — ABNORMAL HIGH (ref 0.450–4.500)

## 2014-08-17 ENCOUNTER — Telehealth: Payer: Self-pay | Admitting: *Deleted

## 2014-08-17 ENCOUNTER — Other Ambulatory Visit: Payer: Self-pay | Admitting: Physician Assistant

## 2014-08-17 NOTE — Telephone Encounter (Signed)
I Lm for the patient that we got a request for pantoprzole sodium 40 mg 1 tab daily.  Patient saw Amy 05-2013 and Amy prescribed it for 1 tab daily.  Patient saw Dr Olevia Perches [phone] and Dr. Olevia Perches said she could take it every other day.  I LM for the patient to call me. I need to know how she has been taking it.  I sent the prescription to [facility] for 1 tab daily, # 90 with 3 refills.

## 2014-08-20 ENCOUNTER — Telehealth: Payer: Self-pay | Admitting: *Deleted

## 2014-08-20 NOTE — Telephone Encounter (Signed)
-----   Message from Darden Dates sent at 08/20/2014  4:25 PM EDT ----- Please call her at this number [phone]  Thanks

## 2014-08-20 NOTE — Telephone Encounter (Signed)
Spoke to patient and she said it is fine that I sent the prescription for Pantoprazole sodium 4o mg, 1 tab daily. # 90 with 3 refills. The patient said she usually take it every other day like Amy Esterwood PA told her to but sometimes she does have to take it daily.  She said she will see Dr. Olevia Perches in the fall.  I told her to call us if she has any problems and we will be glad to see her.

## 2014-08-22 ENCOUNTER — Telehealth: Payer: Self-pay

## 2014-08-22 NOTE — Telephone Encounter (Signed)
Message left on triage voicemail: patient would like to know if she should fast for pending lab appointment tomorrow.   Left message on voicemail for patient informing her to fast, nothing after midnight other than water and black coffee

## 2014-08-23 ENCOUNTER — Other Ambulatory Visit: Payer: Self-pay | Admitting: *Deleted

## 2014-08-23 ENCOUNTER — Other Ambulatory Visit: Payer: Medicare Other

## 2014-08-23 DIAGNOSIS — E039 Hypothyroidism, unspecified: Secondary | ICD-10-CM

## 2014-08-23 DIAGNOSIS — I10 Essential (primary) hypertension: Secondary | ICD-10-CM

## 2014-08-24 LAB — COMPREHENSIVE METABOLIC PANEL
ALT: 11 IU/L (ref 0–32)
AST: 19 IU/L (ref 0–40)
Albumin/Globulin Ratio: 1.8 (ref 1.1–2.5)
Albumin: 4.1 g/dL (ref 3.5–4.8)
Alkaline Phosphatase: 53 IU/L (ref 39–117)
BUN/Creatinine Ratio: 24 (ref 11–26)
BUN: 20 mg/dL (ref 8–27)
Bilirubin Total: 0.3 mg/dL (ref 0.0–1.2)
CO2: 24 mmol/L (ref 18–29)
Calcium: 9 mg/dL (ref 8.7–10.3)
Chloride: 102 mmol/L (ref 97–108)
Creatinine, Ser: 0.82 mg/dL (ref 0.57–1.00)
GFR calc Af Amer: 82 mL/min/{1.73_m2} (ref >59)
GFR calc non Af Amer: 71 mL/min/{1.73_m2} (ref >59)
Globulin, Total: 2.3 g/dL (ref 1.5–4.5)
Glucose: 86 mg/dL (ref 65–99)
Potassium: 4.3 mmol/L (ref 3.5–5.2)
Sodium: 141 mmol/L (ref 134–144)
Total Protein: 6.4 g/dL (ref 6.0–8.5)

## 2014-08-24 LAB — LIPID PANEL
Chol/HDL Ratio: 3 ratio units (ref 0.0–4.4)
Cholesterol, Total: 179 mg/dL (ref 100–199)
HDL: 60 mg/dL (ref >39)
LDL Calculated: 105 mg/dL — ABNORMAL HIGH (ref 0–99)
Triglycerides: 70 mg/dL (ref 0–149)
VLDL Cholesterol Cal: 14 mg/dL (ref 5–40)

## 2014-08-24 LAB — TSH: TSH: 4.27 u[IU]/mL (ref 0.450–4.500)

## 2014-08-24 LAB — HM MAMMOGRAPHY

## 2014-08-29 ENCOUNTER — Encounter: Payer: Self-pay | Admitting: Internal Medicine

## 2014-08-29 ENCOUNTER — Ambulatory Visit (INDEPENDENT_AMBULATORY_CARE_PROVIDER_SITE_OTHER): Payer: Medicare Other | Admitting: Internal Medicine

## 2014-08-29 VITALS — BP 124/72 | HR 73 | Temp 97.9°F | Ht 64.5 in | Wt 153.4 lb

## 2014-08-29 DIAGNOSIS — R040 Epistaxis: Secondary | ICD-10-CM | POA: Diagnosis not present

## 2014-08-29 DIAGNOSIS — C50411 Malignant neoplasm of upper-outer quadrant of right female breast: Secondary | ICD-10-CM

## 2014-08-29 DIAGNOSIS — E039 Hypothyroidism, unspecified: Secondary | ICD-10-CM

## 2014-08-29 DIAGNOSIS — K589 Irritable bowel syndrome without diarrhea: Secondary | ICD-10-CM

## 2014-08-29 DIAGNOSIS — K6289 Other specified diseases of anus and rectum: Secondary | ICD-10-CM | POA: Diagnosis not present

## 2014-08-29 DIAGNOSIS — E785 Hyperlipidemia, unspecified: Secondary | ICD-10-CM

## 2014-08-29 DIAGNOSIS — K222 Esophageal obstruction: Secondary | ICD-10-CM | POA: Diagnosis not present

## 2014-08-29 DIAGNOSIS — I10 Essential (primary) hypertension: Secondary | ICD-10-CM

## 2014-08-29 LAB — HM MAMMOGRAPHY

## 2014-08-29 NOTE — Patient Instructions (Signed)
Do Kegel exercises regularly to strengthen sphincters.

## 2014-08-29 NOTE — Progress Notes (Signed)
Patient ID: Marilyn [name], female   DOB: [birth year], 74 y.o.   MRN: [id number]    Facility  [facility]    Place of Service:   OFFICE   Allergies  Allergen Reactions  . Cetacaine [Butamben-Tetracaine-Benzocaine]     "loss of taste"    Chief Complaint  Patient presents with  . Medical Management of Chronic Issues    6 month Follow up    HPI:  Essential hypertension: Controlled  Esophageal stricture: Denies any dysphagia or heartburn  Hyperlipemia: Controlled  Hypothyroidism, unspecified hypothyroidism type: Compensated  IBS: Continues with gassy, bloated feelings and alternating diarrhea with constipation    Medications: Patient's Medications  New Prescriptions   No medications on file  Previous Medications   ACETAMINOPHEN (TYLENOL) 500 MG TABLET    Take 1 tablets every 6-8 hours as needed for pain but no more than 6 tablets in a day   ALENDRONATE (FOSAMAX) 70 MG TABLET    TAKE 1 TABLET BY MOUTH EVERY 7 DAYS WITH A GLASS OF WATER ON AN EMPTY STOMACH   AMLODIPINE (NORVASC) 10 MG TABLET    ONE DAILY TO CONTROL BP   ASPIRIN 81 MG TABLET    Take 81 mg by mouth daily.   AVODART 0.5 MG CAPSULE    Take 0.5 mg by mouth once a week.    CALCIUM POLYCARBOPHIL ([employer] FIBER LAXATIVE PO)    Take 2 tablets by mouth daily.   CHOLECALCIFEROL (VITAMIN D) 1000 UNITS TABLET    Take 1,000 Units by mouth daily.   DOCUSATE SODIUM (COLACE) 100 MG CAPSULE    Take 100 mg by mouth daily.   FLUTAMIDE (EULEXIN) 125 MG CAPSULE    TAKE 2 CAPSULES EVERY DAY   FLUTICASONE (FLONASE) 50 MCG/ACT NASAL SPRAY    Place 2 sprays into both nostrils daily.   FLUTICASONE (VERAMYST) 27.5 MCG/SPRAY NASAL SPRAY    Place 2 sprays into the nose as needed for rhinitis.   HYOSCYAMINE (LEVSIN/SL) 0.125 MG SL TABLET    Dissolve 1 tablet under tongue every 4-6 hours as needed for diarrhea   MULTIPLE VITAMINS-MINERALS (MULTIVITAMIN & MINERAL PO)    Take 1 tablet by mouth daily.   PANTOPRAZOLE (PROTONIX) 40 MG TABLET    Take 1  tablet (40 mg total) by mouth daily.   PROBIOTIC PRODUCT (PROBIOTIC DAILY PO)    Take by mouth daily.   SELENIUM 200 MCG CAPS    Take 1 capsule by mouth daily.   TAMOXIFEN (NOLVADEX) 20 MG TABLET    TAKE 1 TABLET BY MOUTH EVERY DAY   VITAMIN C (ASCORBIC ACID) 500 MG TABLET    Take 500 mg by mouth daily.   VITAMIN E 400 UNIT CAPSULE    Take 400 Units by mouth daily.   WHEAT DEXTRIN (BENEFIBER) POWD    Take 1 each by mouth daily.  Modified Medications   No medications on file  Discontinued Medications   No medications on file     Review of Systems  Constitutional: Negative for fever, activity change, appetite change, fatigue and unexpected weight change.       History of breast cancer.  HENT: Positive for ear pain and tinnitus.   Eyes: Positive for visual disturbance (corrective lenses).  Cardiovascular: Negative.   Gastrointestinal:       Hx IBS  Endocrine: Negative.   Genitourinary: Negative.   Musculoskeletal: Negative.   Skin:       Technique mild of the lower right side of the back.  Occurred 04/23/14.  Neurological: Positive for dizziness. Negative for tremors, seizures and headaches.       Some difficulty with balance  Hematological: Negative.   Psychiatric/Behavioral:       Hx of mild obsessive and hoarding disorder    Filed Vitals:   08/29/14 1329  BP: 124/72  Pulse: 73  Temp: 97.9 F (36.6 C)  TempSrc: Oral  Height: 5' 4.5" (1.638 m)  Weight: 153 lb 6.4 oz (69.582 kg)   Body mass index is 25.93 kg/(m^2).  Physical Exam  Constitutional: She is oriented to person, place, and time. She appears well-developed and well-nourished. No distress.  HENT:  Head: Normocephalic and atraumatic.  Right Ear: External ear normal.  Left Ear: External ear normal.  Nose: Nose normal.  Mouth/Throat: Oropharynx is clear and moist.  Eyes: Conjunctivae and EOM are normal. Pupils are equal, round, and reactive to light.  Mild pupil irregularity due to cataract extraction of the  right eye  Neck: No JVD present. No tracheal deviation present. No thyromegaly present.  Cardiovascular: Normal rate, regular rhythm, normal heart sounds and intact distal pulses.  Exam reveals no gallop and no friction rub.   No murmur heard. Pulmonary/Chest: No respiratory distress. She has no wheezes. She has no rales.  Abdominal: She exhibits no distension and no mass. There is no tenderness.  Genitourinary:  Done by Dr. Dory Horn  Musculoskeletal: Normal range of motion. She exhibits no edema or tenderness.  Lymphadenopathy:    She has no cervical adenopathy.  Neurological: She is alert and oriented to person, place, and time. She has normal reflexes. No cranial nerve deficit. Coordination normal.  04/24/14 MMSE 29/30. Failed clock drawing.  Skin: No rash noted.  Tick bite of the lower right-sided back. Occurred 04/23/14. Central hemorrhagic dark area. Surrounding mild edema. No welts. Does not feel warm. Not tender.  Psychiatric: She has a normal mood and affect. Her behavior is normal. Thought content normal.     Labs reviewed: Appointment on 08/23/2014  Component Date Value Ref Range Status  . Glucose 08/23/2014 86  65 - 99 mg/dL Final  . BUN 08/23/2014 20  8 - 27 mg/dL Final  . Creatinine, Ser 08/23/2014 0.82  0.57 - 1.00 mg/dL Final  . GFR calc non Af Amer 08/23/2014 71  >59 mL/min/1.73 Final  . GFR calc Af Amer 08/23/2014 82  >59 mL/min/1.73 Final  . BUN/Creatinine Ratio 08/23/2014 24  11 - 26 Final  . Sodium 08/23/2014 141  134 - 144 mmol/L Final  . Potassium 08/23/2014 4.3  3.5 - 5.2 mmol/L Final  . Chloride 08/23/2014 102  97 - 108 mmol/L Final  . CO2 08/23/2014 24  18 - 29 mmol/L Final  . Calcium 08/23/2014 9.0  8.7 - 10.3 mg/dL Final  . Total Protein 08/23/2014 6.4  6.0 - 8.5 g/dL Final  . Albumin 08/23/2014 4.1  3.5 - 4.8 g/dL Final  . Globulin, Total 08/23/2014 2.3  1.5 - 4.5 g/dL Final  . Albumin/Globulin Ratio 08/23/2014 1.8  1.1 - 2.5 Final  . Bilirubin Total  08/23/2014 0.3  0.0 - 1.2 mg/dL Final  . Alkaline Phosphatase 08/23/2014 53  39 - 117 IU/L Final  . AST 08/23/2014 19  0 - 40 IU/L Final  . ALT 08/23/2014 11  0 - 32 IU/L Final  . Cholesterol, Total 08/23/2014 179  100 - 199 mg/dL Final  . Triglycerides 08/23/2014 70  0 - 149 mg/dL Final  . HDL 08/23/2014 60  >39 mg/dL  Final   Comment: According to ATP-III Guidelines, HDL-C >59 mg/dL is considered a negative risk factor for CHD.   [name] VLDL Cholesterol Cal 08/23/2014 14  5 - 40 mg/dL Final  . LDL Calculated 08/23/2014 105* 0 - 99 mg/dL Final  . Chol/HDL Ratio 08/23/2014 3.0  0.0 - 4.4 ratio units Final   Comment:                                   T. Chol/HDL Ratio                                             Men  Women                               1/2 Avg.Risk  3.4    3.3                                   Avg.Risk  5.0    4.4                                2X Avg.Risk  9.6    7.1                                3X Avg.Risk 23.4   11.0   . TSH 08/23/2014 4.270  0.450 - 4.500 uIU/mL Final  Appointment on 07/12/2014  Component Date Value Ref Range Status  . TSH 07/12/2014 4.670* 0.450 - 4.500 uIU/mL Final  . Total Protein 07/12/2014 6.4  6.0 - 8.5 g/dL Final  . Albumin 07/12/2014 4.4  3.5 - 4.8 g/dL Final  . Total Bilirubin 07/12/2014 0.5  0.0 - 1.2 mg/dL Final  . Bilirubin, Direct 07/12/2014 0.13  0.00 - 0.40 mg/dL Final  . Alkaline Phosphatase 07/12/2014 54  39 - 117 IU/L Final  . AST 07/12/2014 21  0 - 40 IU/L Final  . ALT 07/12/2014 12  0 - 32 IU/L Final     Assessment/Plan  1. Essential hypertension Controlled on current medication - Comprehensive metabolic panel; Future  2. Esophageal stricture Asymptomatic  3. Hyperlipemia - Lipid panel; Future  4. Hypothyroidism, unspecified hypothyroidism type - TSH; Future  5. IBS Continues to have some symptoms despite use of probiotics, pantoprazole, and Benefiber. Continues to use hyoscyamine sometimes  6.  Epistaxis Ulceration inside the left nares  7. Breast cancer of upper-outer quadrant of right female breast No relapse  8. Decreased rectal sphincter tone In association with her irritable bowel syndrome, she worries a lot about whether she is going to have an incontinence episode during her bird watching or out in public.

## 2014-10-15 DIAGNOSIS — L658 Other specified nonscarring hair loss: Secondary | ICD-10-CM | POA: Insufficient documentation

## 2015-01-25 NOTE — Progress Notes (Signed)
This encounter was created in error - please disregard.

## 2015-02-09 ENCOUNTER — Other Ambulatory Visit: Payer: Self-pay | Admitting: Internal Medicine

## 2015-02-27 ENCOUNTER — Other Ambulatory Visit: Payer: Self-pay | Admitting: *Deleted

## 2015-02-27 DIAGNOSIS — M8580 Other specified disorders of bone density and structure, unspecified site: Secondary | ICD-10-CM

## 2015-03-01 ENCOUNTER — Other Ambulatory Visit: Payer: Medicare Other

## 2015-03-01 DIAGNOSIS — I10 Essential (primary) hypertension: Secondary | ICD-10-CM

## 2015-03-01 DIAGNOSIS — M8580 Other specified disorders of bone density and structure, unspecified site: Secondary | ICD-10-CM

## 2015-03-01 DIAGNOSIS — E785 Hyperlipidemia, unspecified: Secondary | ICD-10-CM

## 2015-03-01 DIAGNOSIS — E039 Hypothyroidism, unspecified: Secondary | ICD-10-CM

## 2015-03-02 LAB — CBC WITH DIFFERENTIAL/PLATELET
Basophils Absolute: 0.1 10*3/uL (ref 0.0–0.2)
Basos: 1 %
EOS (ABSOLUTE): 0.1 10*3/uL (ref 0.0–0.4)
Eos: 2 %
HEMATOCRIT: 39.9 % (ref 34.0–46.6)
Hemoglobin: 12.8 g/dL (ref 11.1–15.9)
IMMATURE GRANULOCYTES: 0 %
Immature Grans (Abs): 0 10*3/uL (ref 0.0–0.1)
Lymphocytes Absolute: 1.6 10*3/uL (ref 0.7–3.1)
Lymphs: 34 %
MCH: 31.2 pg (ref 26.6–33.0)
MCHC: 32.1 g/dL (ref 31.5–35.7)
MCV: 97 fL (ref 79–97)
MONOS ABS: 0.4 10*3/uL (ref 0.1–0.9)
Monocytes: 8 %
NEUTROS ABS: 2.5 10*3/uL (ref 1.4–7.0)
Neutrophils: 55 %
Platelets: 335 10*3/uL (ref 150–379)
RBC: 4.1 x10E6/uL (ref 3.77–5.28)
RDW: 13.2 % (ref 12.3–15.4)
WBC: 4.5 10*3/uL (ref 3.4–10.8)

## 2015-03-02 LAB — COMPREHENSIVE METABOLIC PANEL
ALK PHOS: 48 IU/L (ref 39–117)
ALT: 14 IU/L (ref 0–32)
AST: 18 IU/L (ref 0–40)
Albumin/Globulin Ratio: 2 (ref 1.1–2.5)
Albumin: 4.3 g/dL (ref 3.5–4.8)
BUN/Creatinine Ratio: 18 (ref 11–26)
BUN: 14 mg/dL (ref 8–27)
Bilirubin Total: 0.5 mg/dL (ref 0.0–1.2)
CALCIUM: 8.9 mg/dL (ref 8.7–10.3)
CO2: 25 mmol/L (ref 18–29)
CREATININE: 0.79 mg/dL (ref 0.57–1.00)
Chloride: 102 mmol/L (ref 97–108)
GFR calc Af Amer: 85 mL/min/{1.73_m2} (ref >59)
GFR, EST NON AFRICAN AMERICAN: 74 mL/min/{1.73_m2} (ref >59)
GLOBULIN, TOTAL: 2.1 g/dL (ref 1.5–4.5)
GLUCOSE: 77 mg/dL (ref 65–99)
Potassium: 4 mmol/L (ref 3.5–5.2)
Sodium: 141 mmol/L (ref 134–144)
Total Protein: 6.4 g/dL (ref 6.0–8.5)

## 2015-03-02 LAB — LIPID PANEL
CHOLESTEROL TOTAL: 181 mg/dL (ref 100–199)
Chol/HDL Ratio: 2.9 ratio units (ref 0.0–4.4)
HDL: 62 mg/dL (ref >39)
LDL CALC: 104 mg/dL — AB (ref 0–99)
Triglycerides: 73 mg/dL (ref 0–149)
VLDL CHOLESTEROL CAL: 15 mg/dL (ref 5–40)

## 2015-03-02 LAB — TSH: TSH: 3.24 u[IU]/mL (ref 0.450–4.500)

## 2015-03-05 ENCOUNTER — Telehealth: Payer: Self-pay

## 2015-03-05 ENCOUNTER — Other Ambulatory Visit: Payer: Self-pay | Admitting: Oncology

## 2015-03-05 ENCOUNTER — Ambulatory Visit (INDEPENDENT_AMBULATORY_CARE_PROVIDER_SITE_OTHER): Payer: Medicare Other | Admitting: Internal Medicine

## 2015-03-05 ENCOUNTER — Encounter: Payer: Self-pay | Admitting: Internal Medicine

## 2015-03-05 VITALS — BP 130/78 | HR 63 | Temp 98.4°F | Ht 65.0 in | Wt 153.0 lb

## 2015-03-05 DIAGNOSIS — E039 Hypothyroidism, unspecified: Secondary | ICD-10-CM

## 2015-03-05 DIAGNOSIS — E785 Hyperlipidemia, unspecified: Secondary | ICD-10-CM | POA: Diagnosis not present

## 2015-03-05 DIAGNOSIS — Z23 Encounter for immunization: Secondary | ICD-10-CM | POA: Diagnosis not present

## 2015-03-05 DIAGNOSIS — K589 Irritable bowel syndrome without diarrhea: Secondary | ICD-10-CM

## 2015-03-05 DIAGNOSIS — M79646 Pain in unspecified finger(s): Secondary | ICD-10-CM | POA: Diagnosis not present

## 2015-03-05 DIAGNOSIS — I10 Essential (primary) hypertension: Secondary | ICD-10-CM

## 2015-03-05 DIAGNOSIS — K222 Esophageal obstruction: Secondary | ICD-10-CM | POA: Diagnosis not present

## 2015-03-05 DIAGNOSIS — C50411 Malignant neoplasm of upper-outer quadrant of right female breast: Secondary | ICD-10-CM | POA: Diagnosis not present

## 2015-03-05 DIAGNOSIS — M25549 Pain in joints of unspecified hand: Secondary | ICD-10-CM

## 2015-03-05 DIAGNOSIS — R040 Epistaxis: Secondary | ICD-10-CM | POA: Diagnosis not present

## 2015-03-05 DIAGNOSIS — K6289 Other specified diseases of anus and rectum: Secondary | ICD-10-CM | POA: Diagnosis not present

## 2015-03-05 NOTE — Telephone Encounter (Signed)
Patient called stating that she would not be refilling her tamoxifen, she has an appt on 03/25/15.  She also stated that she tried to email Dr. Jana Hakim and the email bounced back.  She said that she was "friends" with Dr. Jana Hakim before she was a patient and is requesting his new email

## 2015-03-05 NOTE — Progress Notes (Signed)
Patient ID: Marilyn [name], female   DOB: 03-29-1941, 74 y.o.   MRN: [id number]    Facility  [facility]    Place of Service:   OFFICE    Allergies  Allergen Reactions  . Cetacaine [Butamben-Tetracaine-Benzocaine]     "loss of taste"    Chief Complaint  Patient presents with  . Medical Management of Chronic Issues    6 month follow-up, discuss labs (copy printed)  . Immunizations    Flu vaccine today   . Arthritis    Pain in both thumbs, left is worse    HPI:   Pain in thumb joint with movement, unspecified laterality - mainkly in the left thumb MCP joint. Tender to palpation and to rotation.  Essential hypertension: controlled  Hyperlipemia - controlled  Esophageal stricture - denies increase in dyspgagia  IBS - still with enough episodes of loose and sometimes unconctrollable stools that it is an inconvenience when bird watchiing.  Breast cancer of upper-outer quadrant of right female breast - no evidence for relapse  Epistaxis - no recent episodes  Hypothyroidism, unspecified hypothyroidism type - compensated  Decreased rectal sphincter tone - contributes to the potential for loss of control of stools  Continues on hiking and birding.   Medications: Patient's Medications  New Prescriptions   No medications on file  Previous Medications   ACETAMINOPHEN (TYLENOL) 500 MG TABLET    Take 1 tablets every 6-8 hours as needed for pain but no more than 6 tablets in a day   ALENDRONATE (FOSAMAX) 70 MG TABLET    TAKE 1 TABLET BY MOUTH EVERY 7 DAYS WITH A GLASS OF WATER ON AN EMPTY STOMACH   AMLODIPINE (NORVASC) 10 MG TABLET    TAKE ONE DAILY TO CONTROL BP   ASPIRIN 81 MG TABLET    Take 81 mg by mouth daily.   AVODART 0.5 MG CAPSULE    Take 0.5 mg by mouth once a week.    CALCIUM POLYCARBOPHIL ([employer] FIBER LAXATIVE PO)    Take 2 tablets by mouth daily.   CHOLECALCIFEROL (VITAMIN D) 1000 UNITS TABLET    Take 1,000 Units by mouth daily.   DOCUSATE SODIUM (COLACE) 100 MG CAPSULE     Take 100 mg by mouth daily.   FLUTAMIDE (EULEXIN) 125 MG CAPSULE    TAKE 2 CAPSULES EVERY DAY   FLUTICASONE (VERAMYST) 27.5 MCG/SPRAY NASAL SPRAY    Place 2 sprays into the nose as needed for rhinitis.   HYOSCYAMINE (LEVSIN/SL) 0.125 MG SL TABLET    Dissolve 1 tablet under tongue every 4-6 hours as needed for diarrhea   MULTIPLE VITAMINS-MINERALS (MULTIVITAMIN & MINERAL PO)    Take 1 tablet by mouth daily.   PROBIOTIC PRODUCT (PROBIOTIC DAILY PO)    Take by mouth daily.   TAMOXIFEN (NOLVADEX) 20 MG TABLET    TAKE 1 TABLET BY MOUTH EVERY DAY   VITAMIN C (ASCORBIC ACID) 500 MG TABLET    Take 500 mg by mouth daily.   VITAMIN E 400 UNIT CAPSULE    Take 400 Units by mouth daily.   WHEAT DEXTRIN (BENEFIBER) POWD    Take 1 each by mouth daily.  Modified Medications   Modified Medication Previous Medication   PANTOPRAZOLE (PROTONIX) 40 MG TABLET pantoprazole (PROTONIX) 40 MG tablet      Take 1 tablet (40 mg total) by mouth every other day.    Take 1 tablet (40 mg total) by mouth daily.  Discontinued Medications   FLUTICASONE (FLONASE) 50  MCG/ACT NASAL SPRAY    Place 2 sprays into both nostrils daily.   SELENIUM 200 MCG CAPS    Take 1 capsule by mouth daily.     Review of Systems  Constitutional: Negative for fever, activity change, appetite change, fatigue and unexpected weight change.       History of breast cancer.  HENT: Positive for ear pain and tinnitus.   Eyes: Positive for visual disturbance (corrective lenses).  Cardiovascular: Negative.   Gastrointestinal:       Hx IBS  Endocrine: Negative.   Genitourinary: Negative.   Musculoskeletal: Negative.   Skin:       Technique mild of the lower right side of the back. Occurred 04/23/14.  Neurological: Positive for dizziness. Negative for tremors, seizures and headaches.       Some difficulty with balance  Hematological: Negative.   Psychiatric/Behavioral:       Hx of mild obsessive and hoarding disorder    Filed Vitals:   03/05/15  1324  BP: 130/78  Pulse: 63  Temp: 98.4 F (36.9 C)  TempSrc: Oral  Height: 5' 5"  (1.651 m)  Weight: 153 lb (69.4 kg)  SpO2: 95%   Body mass index is 25.46 kg/(m^2).  Physical Exam  Constitutional: She is oriented to person, place, and time. She appears well-developed and well-nourished. No distress.  HENT:  Head: Normocephalic and atraumatic.  Right Ear: External ear normal.  Left Ear: External ear normal.  Nose: Nose normal.  Mouth/Throat: Oropharynx is clear and moist.  Eyes: Conjunctivae and EOM are normal. Pupils are equal, round, and reactive to light.  Mild pupil irregularity due to cataract extraction of the right eye  Neck: No JVD present. No tracheal deviation present. No thyromegaly present.  Cardiovascular: Normal rate, regular rhythm, normal heart sounds and intact distal pulses.  Exam reveals no gallop and no friction rub.   No murmur heard. Pulmonary/Chest: No respiratory distress. She has no wheezes. She has no rales.  Abdominal: She exhibits no distension and no mass. There is no tenderness.  Genitourinary:  Done by Dr. Dory Horn  Musculoskeletal: Normal range of motion. She exhibits no edema or tenderness.  Lymphadenopathy:    She has no cervical adenopathy.  Neurological: She is alert and oriented to person, place, and time. She has normal reflexes. No cranial nerve deficit. Coordination normal.  04/24/14 MMSE 29/30. Failed clock drawing.  Skin: No rash noted.  Tick bite of the lower right-sided back. Occurred 04/23/14. Central hemorrhagic dark area. Surrounding mild edema. No welts. Does not feel warm. Not tender.  Psychiatric: She has a normal mood and affect. Her behavior is normal. Thought content normal.     Labs reviewed: Lab Summary Latest Ref Rng 03/01/2015 08/23/2014 07/12/2014  Hemoglobin 11.1 - 15.9 g/dL 12.8 (None) (None)  Hematocrit 34.0 - 46.6 % 39.9 (None) (None)  White count 3.4 - 10.8 x10E3/uL 4.5 (None) (None)  Platelet count 150 - 379  x10E3/uL 335 (None) (None)  Sodium 134 - 144 mmol/L 141 141 (None)  Potassium 3.5 - 5.2 mmol/L 4.0 4.3 (None)  Calcium 8.7 - 10.3 mg/dL 8.9 9.0 (None)  Phosphorus - (None) (None) (None)  Creatinine 0.57 - 1.00 mg/dL 0.79 0.82 (None)  AST 0 - 40 IU/L 18 19 21   Alk Phos 39 - 117 IU/L 48 53 54  Bilirubin 0.0 - 1.2 mg/dL 0.5 0.3 0.5  Glucose 65 - 99 mg/dL 77 86 (None)  Cholesterol - (None) (None) (None)  HDL cholesterol >39 mg/dL 62  60 (None)  Triglycerides 0 - 149 mg/dL 73 70 (None)  LDL Direct - (None) (None) (None)  LDL Calc 0 - 99 mg/dL 104(H) 105(H) (None)  Total protein - (None) (None) (None)  Albumin 3.5 - 4.8 g/dL 4.3 4.1 4.4   Lab Results  Component Value Date   TSH 3.240 03/01/2015   Lab Results  Component Value Date   BUN 14 03/01/2015   No results found for: HGBA1C     Assessment/Plan  1. Pain in thumb joint with movement, unspecified laterality She will try Myoflex or Aspercream  2. Essential hypertension controlled - Comprehensive metabolic panel; Future  3. Hyperlipemia - Lipid panel; Future  4. Esophageal stricture Continue to observe  5. IBS continue current medications  6. Breast cancer of upper-outer quadrant of right female breast Continue to monitor  7. Epistaxis cointrolled  8. Hypothyroidism, unspecified hypothyroidism type - TSH; Future  9. Decreased rectal sphincter tone She does not think referral is necessary yeet  10. Need for prophylactic vaccination and inoculation against influenza - Flu Vaccine QUAD 36+ mos PF IM (Fluarix & Fluzone Quad PF)

## 2015-03-05 NOTE — Patient Instructions (Signed)
Try Aspercream or Myoflex to painful joints.

## 2015-03-15 ENCOUNTER — Other Ambulatory Visit: Payer: Self-pay

## 2015-03-15 DIAGNOSIS — C50411 Malignant neoplasm of upper-outer quadrant of right female breast: Secondary | ICD-10-CM

## 2015-03-18 ENCOUNTER — Other Ambulatory Visit: Payer: Medicare Other

## 2015-03-25 ENCOUNTER — Ambulatory Visit (HOSPITAL_BASED_OUTPATIENT_CLINIC_OR_DEPARTMENT_OTHER): Payer: Medicare Other | Admitting: Oncology

## 2015-03-25 VITALS — BP 133/58 | HR 74 | Temp 98.6°F | Resp 18 | Ht 65.0 in | Wt 153.6 lb

## 2015-03-25 DIAGNOSIS — M8580 Other specified disorders of bone density and structure, unspecified site: Secondary | ICD-10-CM

## 2015-03-25 DIAGNOSIS — C50411 Malignant neoplasm of upper-outer quadrant of right female breast: Secondary | ICD-10-CM

## 2015-03-25 DIAGNOSIS — Z853 Personal history of malignant neoplasm of breast: Secondary | ICD-10-CM | POA: Diagnosis not present

## 2015-03-25 NOTE — Progress Notes (Signed)
ID: Conard Novak   DOB: Apr 18, 1941  MR#: [id number]  CSN#:[id number]  PCP: Estill Dooms, MD GYN:  SUCatalina Antigua. Donne Hazel OTHER MD: Rolm Bookbinder, Sabas Sous   HISTORY OF PRESENT ILLNESS: From the original intake note:  Shirleyann had routine screening mammography June 17, 2009, showing dense breast tissue with new calcifications in the upper quadrant on the right breast.  She was recalled on January 12 for diagnostic right mammography and this suggested a benign area of calcification; however, posterior to these there was a set of new microcalcifications and biopsy was recommended.  This was performed on July 09, 2009, and showed ([id number]) an invasive ductal carcinoma with high-grade ductal carcinoma in situ.  The prognostic profile showed the tumor cells to be 93% estrogen receptor positive, 100% progesterone receptor positive, with a low proliferation marker at 11% and HER-2 negative with a ratio by CISH of 1.03.    With this information, the patient was referred to Dr Donne Hazel and bilateral breast MRIs were obtained February 18.  There were only post-biopsy changes seen in the upper outer quadrant of the right breast.  The left breast and the rest of the scan were unremarkable.  With this information, Dr Donne Hazel proceeded to left lumpectomy and sentinel lymph node sampling on August 19, 2009.  The final pathology here ([id number]) showed a 6 mm invasive ductal carcinoma, grade 1, with 0 of 4 sentinel lymph nodes involved.  Margins were negative and there was no evidence of lymphovascular invasion.    The patient was felt to be a good candidate for MammoSite radiation and she was referred to Dr Valere Dross.  After appropriate discussion, the patient agree to MammoSite radiation and this was performed between March 21 and August 30, 2009, the breast receiving 3400 cGy in 10 fractions.   Her subsequent history is as detailed below.  INTERVAL HISTORY: Gabrella returns today for  followup of her breast cancer. She completed actually more than 5 years of tamoxifen in late September. She was delighted to go off that medication although actually she did not notice much change since going off. Perhaps the hot flashes are little bit better. She is also on the Fosamax weekly. She hopes to be able to get off that after she has her next bone density  REVIEW OF SYSTEMS: Her hearing issues are stable. She only wears a hearing aid when she is doing bird watching/listening. She does have some reflux and she tries to sleep a little bit propped up for that reason. She has chronic back pain which is not more intense or persistent than before. Otherwise a detailed review of systems today was noncontributory  PAST MEDICAL HISTORY: Past Medical History  Diagnosis Date  . Hypertension   . Breast cancer     stage 1; right  . Diverticulosis   . Hypertension   . Cataracts, bilateral   . IBS (irritable bowel syndrome)   . Hernia   . Esophageal stricture   . Reflux esophagitis   . Senile osteoporosis   . Lumbago   . Spasm of muscle   . Abnormality of gait   . Unspecified hypothyroidism   . Altered mental status   . Alopecia, unspecified   . Obsessive-compulsive personality disorder   . Mixed hearing loss, bilateral   . Other and unspecified hyperlipidemia   . Migraine with aura, without mention of intractable migraine without mention of status migrainosus   . Unspecified constipation   . Osteoarthrosis, unspecified whether generalized  or localized, unspecified site   . Plantar fascial fibromatosis   . Enterocele   . Irritable bowel syndrome   . Regional enteritis of small intestine   . Esophageal stricture 07/01/12  . Decreased rectal sphincter tone 07/01/12  . Hiatal hernia   . Rectal prolapse   1. Hypertension. 2. History of premature atrial arrhythmias. 3. History of Raynaud syndrome. 4. History of irritable bowel syndrome. 5. Status post tonsillectomy. 6. Status post  inguinal hernia repair. 7. Status post strabismus repair. 8. Status post appendectomy. 9. Status post basal cell skin cancer removal in 1978. 10. History of D&C in 2003. 11. History of enterocele repair at [facility] with bilateral oophorectomies performed at the same time (1985). 12. Status post removal of a benign breast cyst. Status post remote history of migraines  PAST SURGICAL HISTORY: Past Surgical History  Procedure Laterality Date  . Hernia repair    . Appendectomy    . Strabisumus eye surgery      x 2  . Oophorectomy    . Enterocele repair    . Breast lumpectomy Right     snbx, apbi  . Tonsillectomy    . Basal cell carcinoma excision  1978    neck  . Dilation and curettage of uterus    . Hair transplant  2008  . Dermatolfibroma  2012  . Cataract removal od  06/28/2012   . Cataract removal os  12/13/2012    FAMILY HISTORY Family History  Problem Relation Age of Onset  . Breast cancer Maternal Aunt   . Hypertension Mother   . Stroke Mother   . Alzheimer's disease Father   . Colon cancer Neg Hx   . Hypertension Brother   . Gallbladder disease Brother    The patient's father died with Alzheimer disease at the age of 57.  The patient's mother died at the age of 82 from a stroke; she was a smoker.  The patient has 1 brother and 1 sister; as far as she knows they are in good health.  There is no significant history of breast cancer in the family other than 2 maternal aunts who had breast cancer in their 5s.  GYNECOLOGIC HISTORY: The patient is G0.  She was on Prempro for several years until February 2011  SOCIAL HISTORY: She worked for the [employer] for many years and also taught at [employer].  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  She also does a lot of bird watching.  Her 2 dogs currently are a Holiday representative and a terrier mix.   ADVANCED DIRECTIVES: in place; her HCPOA is Cherlynn June, who can be  reached at [facility]: Social History  Substance Use Topics  . Smoking status: Never Smoker   . Smokeless tobacco: Never Used  . Alcohol Use: No     Colonoscopy:  PAP:  Bone density: At [facility] 01/31/2014, showing a T score of -2.0 (no change from 2 years prior)  Lipid panel:  Allergies  Allergen Reactions  . Cetacaine [Butamben-Tetracaine-Benzocaine]     "loss of taste"    Current Outpatient Prescriptions  Medication Sig Dispense Refill  . acetaminophen (TYLENOL) 500 MG tablet Take 1 tablets every 6-8 hours as needed for pain but no more than 6 tablets in a day    . alendronate (FOSAMAX) 70 MG tablet TAKE 1 TABLET BY MOUTH EVERY 7 DAYS WITH A GLASS OF WATER ON AN EMPTY STOMACH  16 tablet 2  . amLODipine (NORVASC) 10 MG tablet TAKE ONE DAILY TO CONTROL BP 90 tablet 2  . aspirin 81 MG tablet Take 81 mg by mouth daily.    . AVODART 0.5 MG capsule Take 0.5 mg by mouth once a week.     . cholecalciferol (VITAMIN D) 1000 UNITS tablet Take 1,000 Units by mouth daily.    [name] docusate sodium (COLACE) 100 MG capsule Take 100 mg by mouth daily.    . flutamide (EULEXIN) 125 MG capsule TAKE 2 CAPSULES EVERY DAY 60 capsule 1  . hyoscyamine (LEVSIN/SL) 0.125 MG SL tablet Dissolve 1 tablet under tongue every 4-6 hours as needed for diarrhea 40 tablet 2  . Multiple Vitamins-Minerals (MULTIVITAMIN & MINERAL PO) Take 1 tablet by mouth daily.    . pantoprazole (PROTONIX) 40 MG tablet Take 1 tablet (40 mg total) by mouth every other day. 90 tablet 3  . Probiotic Product (PROBIOTIC DAILY PO) Take by mouth daily.    . vitamin C (ASCORBIC ACID) 500 MG tablet Take 500 mg by mouth daily.    . vitamin E 400 UNIT capsule Take 400 Units by mouth daily.    . Wheat Dextrin (BENEFIBER) POWD Take 1 each by mouth daily.     No current facility-administered medications for this visit.    OBJECTIVE: Middle-aged white woman in no acute distress Filed Vitals:   03/25/15 1434  BP: 133/58  Pulse:  74  Temp: 98.6 F (37 C)  Resp: 18     Body mass index is 25.56 kg/(m^2).    ECOG FS: 0  Sclerae unicteric, EOMs intact Oropharynx clear, dentition in good repair No cervical or supraclavicular adenopathy Lungs no rales or rhonchi Heart regular rate and rhythm Abd soft, nontender, positive bowel sounds MSK no focal spinal tenderness, no upper extremity lymphedema Neuro: nonfocal, well oriented, appropriate affect Breasts: The right breast is status post MammoSite radiation after lumpectomy. There is no evidence of local recurrence. The cosmetic result is good. The right axilla is benign. The left breast is unremarkable.   LAB RESULTS: Lab Results  Component Value Date   WBC 4.5 03/01/2015   NEUTROABS 2.5 03/01/2015   HGB 12.9 03/08/2014   HCT 39.9 03/01/2015   MCV 97.6 03/08/2014   PLT 310 03/08/2014      Chemistry      Component Value Date/Time   NA 141 03/01/2015 1122   NA 140 02/25/2010 1356   K 4.0 03/01/2015 1122   CL 102 03/01/2015 1122   CO2 25 03/01/2015 1122   BUN 14 03/01/2015 1122   BUN 19 02/25/2010 1356   CREATININE 0.79 03/01/2015 1122      Component Value Date/Time   CALCIUM 8.9 03/01/2015 1122   ALKPHOS 48 03/01/2015 1122   AST 18 03/01/2015 1122   ALT 14 03/01/2015 1122   BILITOT 0.5 03/01/2015 1122   BILITOT 0.5 07/12/2014 0937       Lab Results  Component Value Date   LABCA2 17 08/14/2009    No components found for: [id number]  No results for input(s): INR in the last 168 hours.  Urinalysis No results found for: COLORURINE  STUDIES: Mammography at [facility] 08/24/2014 was benign. Right breast ultrasound on the same day to evaluate right breast "thickness" showed no sonographic evidence of malignancy.   DEXA scan in August 2015 showed a T score of -2.0 (slightly improved)  ASSESSMENT: 74 y.o. [place] woman status post right lumpectomy and sentinel lymph node biopsy  March 2011 for a T1b N0 grade 1 invasive ductal carcinoma, strongly  estrogen and progesterone receptor positive with an MIB-1 of 11% and HER2 not amplified.   (1) status post  MammoSite radiation  (2) started tamoxifen in late March 2011, stopped September 2016  (3) with significant osteopenia, started on alendronate October 2013    PLAN:  Maryanna has completed her 5 years of tamoxifen. She generally did very well with that. She is also 5-1/2 years out from her definitive surgery. She has a very good overall prognosis.  Accordingly at this point I am comfortable releasing her to Dr. Rolly Salter care. As far as breast cancer follow-up is concerned all she will need is yearly mammography and a yearly physician breast exam.  I have encouraged her to participate in the [place] program and also to volunteer here.  As far as her Fosamax is concerned I would wait until her next bone density and then discuss with Dr. Nyoka Cowden. She would like to go off that medication as well if possible  I will be glad to see Miryam again at any point in the future if I when necessary, but as of now we are making no further routine appointments for her here.  Darionna Banke C    03/25/2015

## 2015-04-11 ENCOUNTER — Telehealth: Payer: Self-pay

## 2015-04-11 NOTE — Telephone Encounter (Signed)
Message left on triage voicemail requesting most recent labs to be faxed to doctor at [facility]: Theodoro Clock, MD. Labs were faxed though routing feature in [facility]  Patient aware

## 2015-05-23 ENCOUNTER — Other Ambulatory Visit: Payer: Self-pay | Admitting: Oncology

## 2015-05-29 ENCOUNTER — Other Ambulatory Visit: Payer: Self-pay | Admitting: Physician Assistant

## 2015-08-23 ENCOUNTER — Other Ambulatory Visit: Payer: Self-pay | Admitting: *Deleted

## 2015-08-23 DIAGNOSIS — E039 Hypothyroidism, unspecified: Secondary | ICD-10-CM

## 2015-08-23 DIAGNOSIS — M810 Age-related osteoporosis without current pathological fracture: Secondary | ICD-10-CM

## 2015-09-06 ENCOUNTER — Other Ambulatory Visit: Payer: Medicare Other

## 2015-09-06 DIAGNOSIS — I10 Essential (primary) hypertension: Secondary | ICD-10-CM

## 2015-09-06 DIAGNOSIS — M810 Age-related osteoporosis without current pathological fracture: Secondary | ICD-10-CM

## 2015-09-06 DIAGNOSIS — E785 Hyperlipidemia, unspecified: Secondary | ICD-10-CM

## 2015-09-06 DIAGNOSIS — E039 Hypothyroidism, unspecified: Secondary | ICD-10-CM

## 2015-09-07 LAB — COMPREHENSIVE METABOLIC PANEL
A/G RATIO: 1.7 (ref 1.2–2.2)
ALT: 23 IU/L (ref 0–32)
AST: 28 IU/L (ref 0–40)
Albumin: 4.2 g/dL (ref 3.5–4.8)
Alkaline Phosphatase: 84 IU/L (ref 39–117)
BILIRUBIN TOTAL: 0.5 mg/dL (ref 0.0–1.2)
BUN/Creatinine Ratio: 17 (ref 11–26)
BUN: 14 mg/dL (ref 8–27)
CHLORIDE: 101 mmol/L (ref 96–106)
CO2: 24 mmol/L (ref 18–29)
Calcium: 9.5 mg/dL (ref 8.7–10.3)
Creatinine, Ser: 0.83 mg/dL (ref 0.57–1.00)
GFR calc Af Amer: 80 mL/min/{1.73_m2} (ref >59)
GFR calc non Af Amer: 69 mL/min/{1.73_m2} (ref >59)
GLOBULIN, TOTAL: 2.5 g/dL (ref 1.5–4.5)
Glucose: 82 mg/dL (ref 65–99)
Potassium: 4.2 mmol/L (ref 3.5–5.2)
SODIUM: 142 mmol/L (ref 134–144)
Total Protein: 6.7 g/dL (ref 6.0–8.5)

## 2015-09-07 LAB — LIPID PANEL
Chol/HDL Ratio: 3.3 ratio units (ref 0.0–4.4)
Cholesterol, Total: 191 mg/dL (ref 100–199)
HDL: 58 mg/dL (ref >39)
LDL Calculated: 118 mg/dL — ABNORMAL HIGH (ref 0–99)
Triglycerides: 77 mg/dL (ref 0–149)
VLDL CHOLESTEROL CAL: 15 mg/dL (ref 5–40)

## 2015-09-07 LAB — VITAMIN D 25 HYDROXY (VIT D DEFICIENCY, FRACTURES): VIT D 25 HYDROXY: 58 ng/mL (ref 30.0–100.0)

## 2015-09-07 LAB — TSH: TSH: 4 u[IU]/mL (ref 0.450–4.500)

## 2015-09-10 ENCOUNTER — Encounter: Payer: Self-pay | Admitting: Internal Medicine

## 2015-09-10 ENCOUNTER — Ambulatory Visit (INDEPENDENT_AMBULATORY_CARE_PROVIDER_SITE_OTHER): Payer: Medicare Other | Admitting: Internal Medicine

## 2015-09-10 VITALS — BP 126/68 | HR 64 | Temp 97.6°F | Resp 18 | Ht 65.0 in | Wt 156.0 lb

## 2015-09-10 DIAGNOSIS — I10 Essential (primary) hypertension: Secondary | ICD-10-CM | POA: Diagnosis not present

## 2015-09-10 DIAGNOSIS — H9192 Unspecified hearing loss, left ear: Secondary | ICD-10-CM

## 2015-09-10 DIAGNOSIS — Z0000 Encounter for general adult medical examination without abnormal findings: Secondary | ICD-10-CM

## 2015-09-10 DIAGNOSIS — C50411 Malignant neoplasm of upper-outer quadrant of right female breast: Secondary | ICD-10-CM

## 2015-09-10 DIAGNOSIS — E039 Hypothyroidism, unspecified: Secondary | ICD-10-CM

## 2015-09-10 DIAGNOSIS — Z23 Encounter for immunization: Secondary | ICD-10-CM

## 2015-09-10 DIAGNOSIS — L658 Other specified nonscarring hair loss: Secondary | ICD-10-CM | POA: Diagnosis not present

## 2015-09-10 DIAGNOSIS — E785 Hyperlipidemia, unspecified: Secondary | ICD-10-CM

## 2015-09-10 NOTE — Progress Notes (Signed)
Patient ID: Marilyn [name], female   DOB: [birth year], 75 y.o.   MRN: [id number]   Location:   [place]   Place of Service:   OFFICE Provider: Jeanmarie Hubert, M.D.  Patient Care Team: Estill Dooms, MD as PCP - General (Internal Medicine) Amy Carles Collet, MD (Dermatology) Lafayette Dragon, MD as Consulting Physician (Gastroenterology) Chauncey Cruel, MD as Consulting Physician (Oncology) Shon Hough, MD as Consulting Physician (Ophthalmology) Leta Baptist, MD as Consulting Physician (Otolaryngology) Rolm Bookbinder, MD as Consulting Physician (General Surgery)  Extended Emergency Contact Information Primary Emergency Contact: Leonides Cave Address: [place] Phone: [phone] Relation: Friend  Code Status: full Goals of Care: Advanced Directive information Advanced Directives 09/10/2015  Does patient have an advance directive? Yes;No  Type of Advance Directive -  Does patient want to make changes to advanced directive? No - Patient declined     Chief Complaint  Patient presents with  . Annual Exam    Yearly check-up, EKG, MMSE (30/30) passed clock drawing  . Vision Screening    With corrective lense: OU- 20/20, OD- 20/25, OS- 20/30    HPI: Patient is a 75 y.o. female seen in today for an annual wellness exam.    Found tick on the left calf laterally today.  Breast cancer of upper-outer quadrant of right female breast ([place]) - in remission  Hyperlipemia - controlled  Hypothyroidism, unspecified hypothyroidism type - compensated  Essential hypertension - controlled  Essential alopecia of women - unchanged  Hearing decreased, left - unchanged    Depression screen [facility] 2/9 09/10/2015 03/05/2015 08/29/2014 04/24/2014 06/14/2013  Decreased Interest 0 0 0 0 0  Down, Depressed, Hopeless 0 0 0 0 0  PHQ - 2 Score 0 0 0 0 0    Fall Risk  09/10/2015 03/05/2015 08/29/2014 04/24/2014 06/14/2013  Falls in the past year? No No No No No   MMSE -  Mini Mental State Exam 09/10/2015 04/24/2014  Orientation to time 5 5  Orientation to Place 5 5  Registration 3 3  Attention/ Calculation 5 5  Recall 3 2  Language- name 2 objects 2 2  Language- repeat 1 1  Language- follow 3 step command 3 3  Language- read & follow direction 1 1  Write a sentence 1 1  Copy design 1 1  Total score 30 29     Health Maintenance  Topic Date Due  . PNA vac Low Risk Adult (2 of 2 - PCV13) 05/08/2011  . INFLUENZA VACCINE  01/07/2016  . TETANUS/TDAP  12/06/2021  . COLONOSCOPY  07/01/2022  . DEXA SCAN  Completed  . ZOSTAVAX  Completed    Urinary incontinence? Mild. Does Kegel exercises Functional Status Survey: Is the patient deaf or have difficulty hearing?: No Does the patient have difficulty seeing, even when wearing glasses/contacts?: No Does the patient have difficulty concentrating, remembering, or making decisions?: No Does the patient have difficulty walking or climbing stairs?: No Does the patient have difficulty dressing or bathing?: No Does the patient have difficulty doing errands alone such as visiting a doctor's office or shopping?: No Exercise? Hikes as Hospital doctor Diet? Watches weight. Eating mostly what she wants  Dentition: in good condition Pain: none  Past Medical History  Diagnosis Date  . Hypertension   . Breast cancer ([place])     stage 1; right  . Diverticulosis   . Hypertension   . Cataracts, bilateral   . IBS (irritable bowel syndrome)   .  Hernia   . Esophageal stricture   . Reflux esophagitis   . Senile osteoporosis   . Lumbago   . Spasm of muscle   . Abnormality of gait   . Unspecified hypothyroidism   . Altered mental status   . Alopecia, unspecified   . Obsessive-compulsive personality disorder   . Mixed hearing loss, bilateral   . Other and unspecified hyperlipidemia   . Migraine with aura, without mention of intractable migraine without mention of status migrainosus   . Unspecified constipation   .  Osteoarthrosis, unspecified whether generalized or localized, unspecified site   . Plantar fascial fibromatosis   . Enterocele   . Irritable bowel syndrome   . Regional enteritis of small intestine ([place])   . Esophageal stricture 07/01/12  . Decreased rectal sphincter tone 07/01/12  . Hiatal hernia   . Rectal prolapse     Past Surgical History  Procedure Laterality Date  . Hernia repair    . Appendectomy    . Strabisumus eye surgery      x 2  . Oophorectomy    . Enterocele repair    . Breast lumpectomy Right     snbx, apbi  . Tonsillectomy    . Basal cell carcinoma excision  1978    neck  . Dilation and curettage of uterus    . Hair transplant  2008  . Dermatolfibroma  2012  . Cataract removal od  06/28/2012   . Cataract removal os  12/13/2012    Social History   Social History  . Marital Status: Single    Spouse Name: N/A  . Number of Children: 0  . Years of Education: N/A   Occupational History  . Not on file.   Social History Main Topics  . Smoking status: Never Smoker   . Smokeless tobacco: Never Used  . Alcohol Use: No  . Drug Use: No  . Sexual Activity: Not on file   Other Topics Concern  . Not on file   Social History Narrative    Allergies  Allergen Reactions  . Cetacaine [Butamben-Tetracaine-Benzocaine]     "loss of taste"      Medication List       This list is accurate as of: 09/10/15  3:40 PM.  Always use your most recent med list.               acetaminophen 500 MG tablet  Commonly known as:  TYLENOL  Take 1 tablets every 6-8 hours as needed for pain but no more than 6 tablets in a day     alendronate 70 MG tablet  Commonly known as:  FOSAMAX  TAKE 1 TABLET BY MOUTH EVERY 7 DAYS WITH A GLASS OF WATER ON AN EMPTY STOMACH     amLODipine 10 MG tablet  Commonly known as:  NORVASC  TAKE ONE DAILY TO CONTROL BP     aspirin 81 MG tablet  Take 81 mg by mouth daily.     AVODART 0.5 MG capsule  Generic drug:  dutasteride  Take 0.5  mg by mouth once a week.     BENEFIBER Powd  Take 1 each by mouth daily.     cholecalciferol 1000 units tablet  Commonly known as:  VITAMIN D  Take 1,000 Units by mouth daily.     flutamide 125 MG capsule  Commonly known as:  EULEXIN  TAKE 2 CAPSULES EVERY DAY     hyoscyamine 0.125 MG SL tablet  Commonly known as:  LEVSIN/SL  Dissolve 1 tablet under tongue every 4-6 hours as needed for diarrhea     MULTIVITAMIN & MINERAL PO  Take 1 tablet by mouth daily.     pantoprazole 40 MG tablet  Commonly known as:  PROTONIX  Take 1 tablet (40 mg total) by mouth every other day.     PROBIOTIC DAILY PO  Take by mouth daily.     vitamin C 500 MG tablet  Commonly known as:  ASCORBIC ACID  Take 500 mg by mouth daily.         Review of Systems:  Review of Systems  Constitutional: Negative for fever, chills, diaphoresis, activity change, appetite change, fatigue and unexpected weight change.       History of breast cancer.  HENT: Positive for hearing loss (left ear) and tinnitus. Negative for congestion, ear discharge, ear pain, postnasal drip, rhinorrhea, sore throat, trouble swallowing and voice change.   Eyes: Positive for visual disturbance (corrective lenses). Negative for pain, redness and itching.  Respiratory: Negative for cough, choking, shortness of breath and wheezing.   Cardiovascular: Negative.  Negative for chest pain, palpitations and leg swelling.  Gastrointestinal: Negative for nausea, abdominal pain, diarrhea, constipation and abdominal distention.       Hx IBS  Endocrine: Negative.  Negative for cold intolerance, heat intolerance, polydipsia, polyphagia and polyuria.  Genitourinary: Negative.  Negative for dysuria, urgency, frequency, hematuria, flank pain, vaginal discharge, difficulty urinating and pelvic pain.  Musculoskeletal: Negative.  Negative for myalgias, back pain, arthralgias, gait problem, neck pain and neck stiffness.  Skin: Negative for color change,  pallor and rash.       Hx of eczema  Allergic/Immunologic: Negative.   Neurological: Positive for dizziness. Negative for tremors, seizures, syncope, weakness, numbness and headaches.       Some difficulty with balance  Hematological: Negative.  Negative for adenopathy. Does not bruise/bleed easily.  Psychiatric/Behavioral: Negative for suicidal ideas, hallucinations, behavioral problems, confusion, sleep disturbance, dysphoric mood and agitation. The patient is not nervous/anxious and is not hyperactive.        Hx of mild obsessive and hoarding disorder    Physical Exam: Filed Vitals:   09/10/15 1459  BP: 126/68  Pulse: 64  Temp: 97.6 F (36.4 C)  TempSrc: Oral  Resp: 18  Height: 5\' 5"  (1.651 m)  Weight: 156 lb (70.761 kg)  SpO2: 95%   Body mass index is 25.96 kg/(m^2). Physical Exam  Constitutional: She is oriented to person, place, and time. She appears well-developed and well-nourished. No distress.  HENT:  Head: Normocephalic and atraumatic.  Right Ear: External ear normal.  Left Ear: External ear normal.  Nose: Nose normal.  Mouth/Throat: Oropharynx is clear and moist. No oropharyngeal exudate.  Eyes: Conjunctivae and EOM are normal. Pupils are equal, round, and reactive to light. No scleral icterus.  Mild pupil irregularity due to cataract extraction of the right eye  Neck: No JVD present. No tracheal deviation present. No thyromegaly present.  Cardiovascular: Normal rate, regular rhythm, normal heart sounds and intact distal pulses.  Exam reveals no gallop and no friction rub.   No murmur heard. Pulmonary/Chest: Effort normal. No respiratory distress. She has no wheezes. She has no rales. She exhibits no tenderness.  Abdominal: She exhibits no distension and no mass. There is no tenderness.  Genitourinary:  Done by Dr. Dory Horn  Musculoskeletal: Normal range of motion. She exhibits no edema or tenderness.  Lymphadenopathy:    She has no cervical adenopathy.  Neurological: She is alert and oriented to person, place, and time. She has normal reflexes. No cranial nerve deficit. Coordination normal.  04/24/14 MMSE 29/30. Failed clock drawing. 09/10/15 MMSE 30/30. Failed clock drawing.  Skin: Rash noted. She is not diaphoretic. No erythema. No pallor.  Tick on the left calf. Removed with tweezers.  Psychiatric: She has a normal mood and affect. Her behavior is normal. Judgment and thought content normal.    Labs reviewed: Basic Metabolic Panel:  Recent Labs  03/01/15 1122 09/06/15 0816  NA 141 142  K 4.0 4.2  CL 102 101  CO2 25 24  GLUCOSE 77 82  BUN 14 14  CREATININE 0.79 0.83  CALCIUM 8.9 9.5  TSH 3.240 4.000   Liver Function Tests:  Recent Labs  03/01/15 1122 09/06/15 0816  AST 18 28  ALT 14 23  ALKPHOS 48 84  BILITOT 0.5 0.5  PROT 6.4 6.7  ALBUMIN 4.3 4.2   No results for input(s): LIPASE, AMYLASE in the last 8760 hours. No results for input(s): AMMONIA in the last 8760 hours. CBC:  Recent Labs  03/01/15 1122  WBC 4.5  NEUTROABS 2.5  HCT 39.9  MCV 97  PLT 335   Lipid Panel:  Recent Labs  03/01/15 1122 09/06/15 0816  CHOL 181 191  HDL 62 58  LDLCALC 104* 118*  TRIG 73 77  CHOLHDL 2.9 3.3   No results found for: HGBA1C   09/10/2015 EKG: 66 bpm. Normal sinus rhythm. Normal EKG.  Assessment/Plan  1. Breast cancer of upper-outer quadrant of right female breast [facility]) Status post lumpectomy and radiation iin 2011. Took tamoxifen for 5 yuears and went off it 2016. No sign of relapse.  2. Hyperlipemia Excellent control 6 months ago  3. Hypothyroidism, unspecified hypothyroidism type compensated  4. Essential hypertension controlled  5. Essential alopecia of women improved  6. Hearing decreased, left Mild decrease  7. Need for vaccination with 13-polyvalent pneumococcal conjugate vaccine administered

## 2015-10-29 NOTE — Addendum Note (Signed)
Addended by: Denyse Amass on: 10/29/2015 03:32 PM   Modules accepted: Orders, SmartSet

## 2015-11-01 ENCOUNTER — Telehealth: Payer: Self-pay | Admitting: *Deleted

## 2015-11-01 NOTE — Telephone Encounter (Signed)
Patient called and left message and stated that she went and saw Dr. Nori Riis for her annual GYN appointment and he noticed she had swollen ankles and legs and thinks she needs a fluid pill and told her to call her Primary.  I tried calling her back to offer an appointment with Dr. Nyoka Cowden an had to leave message to return call.

## 2015-11-05 LAB — HM MAMMOGRAPHY

## 2015-11-05 NOTE — Telephone Encounter (Signed)
Patient called and left message to return call.  Tried calling patient back and got voicemail. Scheduled an appointment for tomorrow and left appointment on voicemail.

## 2015-11-05 NOTE — Telephone Encounter (Signed)
Patient notified and agreed.  

## 2015-11-06 ENCOUNTER — Ambulatory Visit (INDEPENDENT_AMBULATORY_CARE_PROVIDER_SITE_OTHER): Payer: Medicare Other | Admitting: Internal Medicine

## 2015-11-06 ENCOUNTER — Encounter: Payer: Self-pay | Admitting: Internal Medicine

## 2015-11-06 ENCOUNTER — Encounter: Payer: Self-pay | Admitting: *Deleted

## 2015-11-06 VITALS — BP 132/78 | HR 67 | Temp 98.1°F | Ht 65.0 in | Wt 155.0 lb

## 2015-11-06 DIAGNOSIS — R609 Edema, unspecified: Secondary | ICD-10-CM | POA: Diagnosis not present

## 2015-11-06 MED ORDER — FUROSEMIDE 20 MG PO TABS: ORAL_TABLET | ORAL | Status: DC

## 2015-11-06 NOTE — Progress Notes (Signed)
Patient ID: Marilyn [name], female   DOB: 1940-09-10, 75 y.o.   MRN: [id number]    Facility  [place]    Place of Service:   OFFICE    Allergies  Allergen Reactions  . Cetacaine [Butamben-Tetracaine-Benzocaine]     "loss of taste"    Chief Complaint  Patient presents with  . edema    in both ankles and feet for about one month.    HPI:  Increased edema of both legs for several weeks. Painless. Has varicosities bilaterally. Lab in March 2017 was fine. Denies significant dietary shift or increased salt intake.  Medications: Patient's Medications  New Prescriptions   No medications on file  Previous Medications   ACETAMINOPHEN (TYLENOL) 500 MG TABLET    Take 1 tablets every 6-8 hours as needed for pain but no more than 6 tablets in a day   ALENDRONATE (FOSAMAX) 70 MG TABLET    TAKE 1 TABLET BY MOUTH EVERY 7 DAYS WITH A GLASS OF WATER ON AN EMPTY STOMACH   AMLODIPINE (NORVASC) 10 MG TABLET    TAKE ONE DAILY TO CONTROL BP   ASPIRIN 81 MG TABLET    Take 81 mg by mouth daily.   AVODART 0.5 MG CAPSULE    Take 0.5 mg by mouth once a week.    CHOLECALCIFEROL (VITAMIN D-3) 1000 UNITS CAPS    Take by mouth. Take one Vitamin D-3 1,000 daily   FLUTAMIDE (EULEXIN) 125 MG CAPSULE    TAKE 2 CAPSULES EVERY DAY   HYOSCYAMINE (LEVSIN/SL) 0.125 MG SL TABLET    Dissolve 1 tablet under tongue every 4-6 hours as needed for diarrhea   MULTIPLE VITAMINS-MINERALS (MULTIVITAMIN & MINERAL PO)    Take 1 tablet by mouth daily.   PANTOPRAZOLE (PROTONIX) 40 MG TABLET    Take 1 tablet (40 mg total) by mouth every other day.   PROBIOTIC PRODUCT (PROBIOTIC DAILY PO)    Take by mouth daily.   VITAMIN C (ASCORBIC ACID) 500 MG TABLET    Take 500 mg by mouth daily.   WHEAT DEXTRIN (BENEFIBER) POWD    Take 1 each by mouth daily.  Modified Medications   No medications on file  Discontinued Medications   CHOLECALCIFEROL (VITAMIN D) 1000 UNITS TABLET    Take 1,000 Units by mouth daily.    Review of Systems    Constitutional: Negative for fever, chills, diaphoresis, activity change, appetite change, fatigue and unexpected weight change.       History of breast cancer.  HENT: Positive for hearing loss (left ear) and tinnitus. Negative for congestion, ear discharge, ear pain, postnasal drip, rhinorrhea, sore throat, trouble swallowing and voice change.   Eyes: Positive for visual disturbance (corrective lenses). Negative for pain, redness and itching.  Respiratory: Negative for cough, choking, shortness of breath and wheezing.   Cardiovascular: Positive for leg swelling. Negative for chest pain and palpitations.  Gastrointestinal: Negative for nausea, abdominal pain, diarrhea, constipation and abdominal distention.       Hx IBS  Endocrine: Negative.  Negative for cold intolerance, heat intolerance, polydipsia, polyphagia and polyuria.  Genitourinary: Negative.  Negative for dysuria, urgency, frequency, hematuria, flank pain, vaginal discharge, difficulty urinating and pelvic pain.  Musculoskeletal: Negative.  Negative for myalgias, back pain, arthralgias, gait problem, neck pain and neck stiffness.  Skin: Negative for color change, pallor and rash.       Hx of eczema  Allergic/Immunologic: Negative.   Neurological: Positive for dizziness. Negative for tremors, seizures, syncope, weakness,  numbness and headaches.       Some difficulty with balance  Hematological: Negative.  Negative for adenopathy. Does not bruise/bleed easily.  Psychiatric/Behavioral: Negative for suicidal ideas, hallucinations, behavioral problems, confusion, sleep disturbance, dysphoric mood and agitation. The patient is not nervous/anxious and is not hyperactive.        Hx of mild obsessive and hoarding disorder    Filed Vitals:   11/06/15 1558  BP: 132/78  Pulse: 67  Temp: 98.1 F (36.7 C)  TempSrc: Oral  Height: 5' 5"  (1.651 m)  Weight: 155 lb (70.308 kg)  SpO2: 96%   Body mass index is 25.79 kg/(m^2). Filed Weights    11/06/15 1558  Weight: 155 lb (70.308 kg)     Physical Exam  Constitutional: She is oriented to person, place, and time. She appears well-developed and well-nourished. No distress.  HENT:  Head: Normocephalic and atraumatic.  Right Ear: External ear normal.  Left Ear: External ear normal.  Nose: Nose normal.  Mouth/Throat: Oropharynx is clear and moist. No oropharyngeal exudate.  Eyes: Conjunctivae and EOM are normal. Pupils are equal, round, and reactive to light. No scleral icterus.  Mild pupil irregularity due to cataract extraction of the right eye  Neck: No JVD present. No tracheal deviation present. No thyromegaly present.  Cardiovascular: Normal rate, regular rhythm, normal heart sounds and intact distal pulses.  Exam reveals no gallop and no friction rub.   No murmur heard. Pulmonary/Chest: Effort normal. No respiratory distress. She has no wheezes. She has no rales. She exhibits no tenderness.  Abdominal: She exhibits no distension and no mass. There is no tenderness.  Genitourinary:  Done by Dr. Dory Horn  Musculoskeletal: Normal range of motion. She exhibits edema. She exhibits no tenderness.  Lymphadenopathy:    She has no cervical adenopathy.  Neurological: She is alert and oriented to person, place, and time. She has normal reflexes. No cranial nerve deficit. Coordination normal.  04/24/14 MMSE 29/30. Failed clock drawing. 09/10/15 MMSE 30/30. Failed clock drawing.  Skin: Rash (eczema) noted. She is not diaphoretic. No erythema. No pallor.  Psychiatric: She has a normal mood and affect. Her behavior is normal. Judgment and thought content normal.    Labs reviewed: Lab Summary Latest Ref Rng 09/06/2015 03/01/2015 08/23/2014  Hemoglobin 11.1 - 15.9 g/dL (None) 12.8 (None)  Hematocrit 34.0 - 46.6 % (None) 39.9 (None)  White count 3.4 - 10.8 x10E3/uL (None) 4.5 (None)  Platelet count 150 - 379 x10E3/uL (None) 335 (None)  Sodium 134 - 144 mmol/L 142 141 141  Potassium 3.5  - 5.2 mmol/L 4.2 4.0 4.3  Calcium 8.7 - 10.3 mg/dL 9.5 8.9 9.0  Phosphorus - (None) (None) (None)  Creatinine 0.57 - 1.00 mg/dL 0.83 0.79 0.82  AST 0 - 40 IU/L 28 18 19   Alk Phos 39 - 117 IU/L 84 48 53  Bilirubin 0.0 - 1.2 mg/dL 0.5 0.5 0.3  Glucose 65 - 99 mg/dL 82 77 86  Cholesterol - (None) (None) (None)  HDL cholesterol >39 mg/dL 58 62 60  Triglycerides 0 - 149 mg/dL 77 73 70  LDL Direct - (None) (None) (None)  LDL Calc 0 - 99 mg/dL 118(H) 104(H) 105(H)  Total protein - (None) (None) (None)  Albumin 3.5 - 4.8 g/dL 4.2 4.3 4.1   Lab Results  Component Value Date   TSH 4.000 09/06/2015   TSH 3.240 03/01/2015   TSH 4.270 08/23/2014   Lab Results  Component Value Date   BUN 14 09/06/2015  BUN 14 03/01/2015   BUN 20 08/23/2014   No results found for: HGBA1C  Assessment/Plan  1. Edema, unspecified type - Recommend compression stockings. Moderate compression 15-20 mm. - furosemide (LASIX) 20 MG tablet; One each morning to reduce edema  Dispense: 30 tablet; Refill: 5

## 2015-11-18 ENCOUNTER — Other Ambulatory Visit: Payer: Self-pay | Admitting: Internal Medicine

## 2016-01-15 ENCOUNTER — Ambulatory Visit (INDEPENDENT_AMBULATORY_CARE_PROVIDER_SITE_OTHER): Payer: Medicare Other | Admitting: Internal Medicine

## 2016-01-15 ENCOUNTER — Encounter: Payer: Self-pay | Admitting: Internal Medicine

## 2016-01-15 VITALS — BP 132/68 | HR 62 | Temp 97.6°F | Ht 65.0 in | Wt 155.0 lb

## 2016-01-15 DIAGNOSIS — K2100 Gastro-esophageal reflux disease with esophagitis, without bleeding: Secondary | ICD-10-CM

## 2016-01-15 DIAGNOSIS — R609 Edema, unspecified: Secondary | ICD-10-CM | POA: Diagnosis not present

## 2016-01-15 DIAGNOSIS — I10 Essential (primary) hypertension: Secondary | ICD-10-CM | POA: Diagnosis not present

## 2016-01-15 MED ORDER — LOSARTAN POTASSIUM 50 MG PO TABS: 50.0000 mg | ORAL_TABLET | Freq: Every day | ORAL | 3 refills | Status: DC

## 2016-01-15 NOTE — Progress Notes (Signed)
Facility  [place]    Place of Service:   OFFICE    Allergies  Allergen Reactions  . Cetacaine [Butamben-Tetracaine-Benzocaine]     "loss of taste"    Chief Complaint  Patient presents with  . Edema    patient doesn't think the Furosemide has helped the edema    HPI:   Edema, unspecified type - likely related to amlodipine  Essential hypertension - controlled  Reflux esophagitis - may have been associated with a recent chest discomfort associated with nausea and heartburn  Medications: Patient's Medications  New Prescriptions   No medications on file  Previous Medications   ACETAMINOPHEN (TYLENOL) 500 MG TABLET    Take 1 tablets every 6-8 hours as needed for pain but no more than 6 tablets in a day   ALENDRONATE (FOSAMAX) 70 MG TABLET    TAKE 1 TABLET BY MOUTH EVERY 7 DAYS WITH A GLASS OF WATER ON AN EMPTY STOMACH   AMLODIPINE (NORVASC) 10 MG TABLET    TAKE 1 TABLET BY MOUTH DAILY TO CONTROL BLOOD PRESSURE   ASPIRIN 81 MG TABLET    Take 81 mg by mouth daily.   AVODART 0.5 MG CAPSULE    Take 0.5 mg by mouth once a week.    CHOLECALCIFEROL (VITAMIN D-3) 1000 UNITS CAPS    Take by mouth. Take one Vitamin D-3 1,000 daily   FLUTAMIDE (EULEXIN) 125 MG CAPSULE    TAKE 2 CAPSULES EVERY DAY   FUROSEMIDE (LASIX) 20 MG TABLET    One each morning to reduce edema   HYOSCYAMINE (LEVSIN/SL) 0.125 MG SL TABLET    Dissolve 1 tablet under tongue every 4-6 hours as needed for diarrhea   MULTIPLE VITAMINS-MINERALS (MULTIVITAMIN & MINERAL PO)    Take 1 tablet by mouth daily.   PANTOPRAZOLE (PROTONIX) 40 MG TABLET    Take 1 tablet (40 mg total) by mouth every other day.   PROBIOTIC PRODUCT (PROBIOTIC DAILY PO)    Take by mouth daily.   VITAMIN C (ASCORBIC ACID) 500 MG TABLET    Take 500 mg by mouth daily.   WHEAT DEXTRIN (BENEFIBER) POWD    Take 1 each by mouth daily.  Modified Medications   No medications on file  Discontinued Medications   No medications on file    Review of Systems    Constitutional: Negative for activity change, appetite change, chills, diaphoresis, fatigue, fever and unexpected weight change.       History of breast cancer.  HENT: Positive for hearing loss (left ear) and tinnitus. Negative for congestion, ear discharge, ear pain, postnasal drip, rhinorrhea, sore throat, trouble swallowing and voice change.   Eyes: Positive for visual disturbance (corrective lenses). Negative for pain, redness and itching.  Respiratory: Negative for cough, choking, shortness of breath and wheezing.   Cardiovascular: Positive for leg swelling. Negative for chest pain and palpitations.  Gastrointestinal: Negative for abdominal distention, abdominal pain, constipation, diarrhea and nausea.       Hx IBS  Endocrine: Negative.  Negative for cold intolerance, heat intolerance, polydipsia, polyphagia and polyuria.  Genitourinary: Negative.  Negative for difficulty urinating, dysuria, flank pain, frequency, hematuria, pelvic pain, urgency and vaginal discharge.  Musculoskeletal: Negative.  Negative for arthralgias, back pain, gait problem, myalgias, neck pain and neck stiffness.  Skin: Negative for color change, pallor and rash.       Hx of eczema  Allergic/Immunologic: Negative.   Neurological: Positive for dizziness. Negative for tremors, seizures, syncope, weakness, numbness and  headaches.       Some difficulty with balance  Hematological: Negative.  Negative for adenopathy. Does not bruise/bleed easily.  Psychiatric/Behavioral: Negative for agitation, behavioral problems, confusion, dysphoric mood, hallucinations, sleep disturbance and suicidal ideas. The patient is not nervous/anxious and is not hyperactive.        Hx of mild obsessive and hoarding disorder    Vitals:   01/15/16 1255  BP: 132/68  Pulse: 62  Temp: 97.6 F (36.4 C)  TempSrc: Oral  SpO2: 97%  Weight: 155 lb (70.3 kg)  Height: _0  (1.651 m)   Body mass index is 25.79 kg/m. Wt Readings from Last 3  Encounters:  01/15/16 155 lb (70.3 kg)  11/06/15 155 lb (70.3 kg)  09/10/15 156 lb (70.8 kg)      Physical Exam  Constitutional: She is oriented to person, place, and time. She appears well-developed and well-nourished. No distress.  HENT:  Head: Normocephalic and atraumatic.  Right Ear: External ear normal.  Left Ear: External ear normal.  Nose: Nose normal.  Mouth/Throat: Oropharynx is clear and moist. No oropharyngeal exudate.  Eyes: Conjunctivae and EOM are normal. Pupils are equal, round, and reactive to light. No scleral icterus.  Mild pupil irregularity due to cataract extraction of the right eye  Neck: No JVD present. No tracheal deviation present. No thyromegaly present.  Cardiovascular: Normal rate, regular rhythm, normal heart sounds and intact distal pulses.  Exam reveals no gallop and no friction rub.   No murmur heard. Pulmonary/Chest: Effort normal. No respiratory distress. She has no wheezes. She has no rales. She exhibits no tenderness.  Abdominal: She exhibits no distension and no mass. There is no tenderness.  Genitourinary:  Genitourinary Comments: Done by Dr. Dory Horn  Musculoskeletal: Normal range of motion. She exhibits edema. She exhibits no tenderness.  Lymphadenopathy:    She has no cervical adenopathy.  Neurological: She is alert and oriented to person, place, and time. She has normal reflexes. No cranial nerve deficit. Coordination normal.  04/24/14 MMSE 29/30. Failed clock drawing. 09/10/15 MMSE 30/30. Failed clock drawing.  Skin: Rash (eczema) noted. She is not diaphoretic. No erythema. No pallor.  Psychiatric: She has a normal mood and affect. Her behavior is normal. Judgment and thought content normal.    Labs reviewed: Lab Summary Latest Ref Rng & Units 09/06/2015 03/01/2015 08/23/2014  Hemoglobin 11.1 - 15.9 g/dL (None) 12.8 (None)  Hematocrit 34.0 - 46.6 % (None) 39.9 (None)  White count 3.4 - 10.8 x10E3/uL (None) 4.5 (None)  Platelet count 150 -  379 x10E3/uL (None) 335 (None)  Sodium 134 - 144 mmol/L 142 141 141  Potassium 3.5 - 5.2 mmol/L 4.2 4.0 4.3  Calcium 8.7 - 10.3 mg/dL 9.5 8.9 9.0  Phosphorus - (None) (None) (None)  Creatinine 0.57 - 1.00 mg/dL 0.83 0.79 0.82  AST 0 - 40 IU/L _1 Alk Phos 39 - 117 IU/L 84 48 53  Bilirubin 0.0 - 1.2 mg/dL 0.5 0.5 0.3  Glucose 65 - 99 mg/dL 82 77 86  Cholesterol - (None) (None) (None)  HDL cholesterol >39 mg/dL 58 62 60  Triglycerides 0 - 149 mg/dL 77 73 70  LDL Direct - (None) (None) (None)  LDL Calc 0 - 99 mg/dL 118(H) 104(H) 105(H)  Total protein - (None) (None) (None)  Albumin 3.5 - 4.8 g/dL 4.2 4.3 4.1  Some recent data might be hidden   Lab Results  Component Value Date   TSH 4.000 09/06/2015   TSH  3.240 03/01/2015   TSH 4.270 08/23/2014   Lab Results  Component Value Date   BUN 14 09/06/2015   BUN 14 03/01/2015   BUN 20 08/23/2014   No results found for: HGBA1C  Assessment/Plan 1. Edema, unspecified type Stop amlodipine  2. Essential hypertension Stop amlodipine - losartan (COZAAR) 50 MG tablet; Take 1 tablet (50 mg total) by mouth daily.  Dispense: 90 tablet; Refill: 3  3. Reflux esophagitis continue Protonix

## 2016-02-21 ENCOUNTER — Telehealth: Payer: Self-pay | Admitting: Internal Medicine

## 2016-02-23 NOTE — Telephone Encounter (Signed)
Sure. New patient slot only

## 2016-02-24 NOTE — Telephone Encounter (Signed)
Left message for patient to return my call.

## 2016-02-25 ENCOUNTER — Telehealth: Payer: Self-pay | Admitting: Internal Medicine

## 2016-02-25 ENCOUNTER — Encounter: Payer: Self-pay | Admitting: Internal Medicine

## 2016-02-25 ENCOUNTER — Telehealth: Payer: Self-pay

## 2016-02-25 MED ORDER — PANTOPRAZOLE SODIUM 40 MG PO TBEC: 40.0000 mg | DELAYED_RELEASE_TABLET | ORAL | 3 refills | Status: DC

## 2016-02-25 NOTE — Telephone Encounter (Signed)
-----   Message from Irene Shipper, MD sent at 02/25/2016 11:17 AM EDT ----- Yes  ----- Message ----- From: Audrea Muscat, CMA Sent: 02/25/2016  10:47 AM To: Irene Shipper, MD  This is a former patient of Dr. Olevia Perches who is transferring to you and has an appointment 05/11/2016.  Ok to refill her Pantoprazole to last until her appointment?

## 2016-02-25 NOTE — Telephone Encounter (Signed)
Refilled Protonix to last until her office visit, per Dr. Henrene Pastor

## 2016-03-10 ENCOUNTER — Ambulatory Visit: Payer: Medicare Other | Admitting: Internal Medicine

## 2016-04-07 ENCOUNTER — Encounter: Payer: Self-pay | Admitting: Internal Medicine

## 2016-04-07 ENCOUNTER — Ambulatory Visit (INDEPENDENT_AMBULATORY_CARE_PROVIDER_SITE_OTHER): Payer: Medicare Other | Admitting: Internal Medicine

## 2016-04-07 VITALS — BP 124/74 | HR 66 | Temp 97.9°F | Ht 65.0 in | Wt 149.0 lb

## 2016-04-07 DIAGNOSIS — Z23 Encounter for immunization: Secondary | ICD-10-CM

## 2016-04-07 DIAGNOSIS — E039 Hypothyroidism, unspecified: Secondary | ICD-10-CM

## 2016-04-07 DIAGNOSIS — E785 Hyperlipidemia, unspecified: Secondary | ICD-10-CM

## 2016-04-07 DIAGNOSIS — I10 Essential (primary) hypertension: Secondary | ICD-10-CM

## 2016-04-07 DIAGNOSIS — R432 Parageusia: Secondary | ICD-10-CM | POA: Diagnosis not present

## 2016-04-07 NOTE — Progress Notes (Signed)
Facility  [facility]    Place of Service:   OFFICE    Allergies  Allergen Reactions  . Cetacaine [Butamben-Tetracaine-Benzocaine]     "loss of taste"    Chief Complaint  Patient presents with  . Medical Management of Chronic Issues    6 month medication management blood pressure, cholesterol, thyroid    HPI:  She thinks the enterocele repair from 2003 has given out. She has appt with Dr. Henrene Pastor in Dec 2017.  Essential hypertension - controlled  Hyperlipidemia, unspecified hyperlipidemia type - recheck next year.  Hypothyroidism, unspecified type - recheck next year  Dysgeusia - lack of taste  Has lost 5# since Aug 2017. Under stress due to decomposition of her kitchen floor from moisture. Had to put her dog to sleep recently.    Medications: Patient's Medications  New Prescriptions   No medications on file  Previous Medications   ACETAMINOPHEN (TYLENOL) 500 MG TABLET    Take 1 tablets every 6-8 hours as needed for pain but no more than 6 tablets in a day   ALENDRONATE (FOSAMAX) 70 MG TABLET    TAKE 1 TABLET BY MOUTH EVERY 7 DAYS WITH A GLASS OF WATER ON AN EMPTY STOMACH   ASPIRIN 81 MG TABLET    Take 81 mg by mouth daily.   AVODART 0.5 MG CAPSULE    Take 0.5 mg by mouth once a week.    CHOLECALCIFEROL (VITAMIN D-3) 1000 UNITS CAPS    Take by mouth. Take one Vitamin D-3 1,000 daily   FLUTAMIDE (EULEXIN) 125 MG CAPSULE    TAKE 2 CAPSULES EVERY DAY   HYOSCYAMINE (LEVSIN/SL) 0.125 MG SL TABLET    Dissolve 1 tablet under tongue every 4-6 hours as needed for diarrhea   LOSARTAN (COZAAR) 50 MG TABLET    Take 1 tablet (50 mg total) by mouth daily.   MULTIPLE VITAMINS-MINERALS (MULTIVITAMIN & MINERAL PO)    Take 1 tablet by mouth daily.   PANTOPRAZOLE (PROTONIX) 40 MG TABLET    Take 1 tablet (40 mg total) by mouth every other day.   PROBIOTIC PRODUCT (PROBIOTIC DAILY PO)    Take by mouth daily.   VITAMIN C (ASCORBIC ACID) 500 MG TABLET    Take 500 mg by mouth daily.   WHEAT  DEXTRIN (BENEFIBER) POWD    Take 1 each by mouth daily.  Modified Medications   No medications on file  Discontinued Medications   No medications on file    Review of Systems  Constitutional: Negative for activity change, appetite change, chills, diaphoresis, fatigue, fever and unexpected weight change.       History of breast cancer.  HENT: Positive for hearing loss (left ear) and tinnitus. Negative for congestion, ear discharge, ear pain, postnasal drip, rhinorrhea, sore throat, trouble swallowing and voice change.   Eyes: Positive for visual disturbance (corrective lenses). Negative for pain, redness and itching.  Respiratory: Negative for cough, choking, shortness of breath and wheezing.   Cardiovascular: Positive for leg swelling. Negative for chest pain and palpitations.  Gastrointestinal: Negative for abdominal distention, abdominal pain, constipation, diarrhea and nausea.       Hx IBS. Rare episodes. Only a couple of times.  Endocrine: Negative.  Negative for cold intolerance, heat intolerance, polydipsia, polyphagia and polyuria.  Genitourinary: Negative.  Negative for difficulty urinating, dysuria, flank pain, frequency, hematuria, pelvic pain, urgency and vaginal discharge.  Musculoskeletal: Negative.  Negative for arthralgias, back pain, gait problem, myalgias, neck pain and neck stiffness.  Skin: Negative for color change, pallor and rash.       Hx of eczema  Allergic/Immunologic: Negative.   Neurological: Positive for dizziness. Negative for tremors, seizures, syncope, weakness, numbness and headaches.       Some difficulty with balance  Hematological: Negative.  Negative for adenopathy. Does not bruise/bleed easily.  Psychiatric/Behavioral: Negative for agitation, behavioral problems, confusion, dysphoric mood, hallucinations, sleep disturbance and suicidal ideas. The patient is not nervous/anxious and is not hyperactive.        Hx of mild obsessive and hoarding disorder     Vitals:   04/07/16 1526  BP: 124/74  Pulse: 66  Temp: 97.9 F (36.6 C)  TempSrc: Oral  SpO2: 96%  Weight: 149 lb (67.6 kg)  Height: _0  (1.651 m)   Body mass index is 24.79 kg/m. Wt Readings from Last 3 Encounters:  04/07/16 149 lb (67.6 kg)  01/15/16 155 lb (70.3 kg)  11/06/15 155 lb (70.3 kg)      Physical Exam  Constitutional: She is oriented to person, place, and time. She appears well-developed and well-nourished. No distress.  HENT:  Head: Normocephalic and atraumatic.  Right Ear: External ear normal.  Left Ear: External ear normal.  Nose: Nose normal.  Mouth/Throat: Oropharynx is clear and moist. No oropharyngeal exudate.  Eyes: Conjunctivae and EOM are normal. Pupils are equal, round, and reactive to light. No scleral icterus.  Mild pupil irregularity due to cataract extraction of the right eye  Neck: No JVD present. No tracheal deviation present. No thyromegaly present.  Cardiovascular: Normal rate, regular rhythm, normal heart sounds and intact distal pulses.  Exam reveals no gallop and no friction rub.   No murmur heard. Pulmonary/Chest: Effort normal. No respiratory distress. She has no wheezes. She has no rales. She exhibits no tenderness.  Abdominal: She exhibits no distension and no mass. There is no tenderness.  Genitourinary:  Genitourinary Comments: Done by Dr. Dory Horn  Musculoskeletal: Normal range of motion. She exhibits edema. She exhibits no tenderness.  Lymphadenopathy:    She has no cervical adenopathy.  Neurological: She is alert and oriented to person, place, and time. She has normal reflexes. No cranial nerve deficit. Coordination normal.  04/24/14 MMSE 29/30. Failed clock drawing. 09/10/15 MMSE 30/30. Failed clock drawing.  Skin: Rash (eczema) noted. She is not diaphoretic. No erythema. No pallor.  Psychiatric: She has a normal mood and affect. Her behavior is normal. Judgment and thought content normal.    Labs reviewed: Lab Summary  Latest Ref Rng & Units 09/06/2015 03/01/2015 08/23/2014  Hemoglobin 11.1 - 15.9 g/dL (None) 12.8 (None)  Hematocrit 34.0 - 46.6 % (None) 39.9 (None)  White count 3.4 - 10.8 x10E3/uL (None) 4.5 (None)  Platelet count 150 - 379 x10E3/uL (None) 335 (None)  Sodium 134 - 144 mmol/L 142 141 141  Potassium 3.5 - 5.2 mmol/L 4.2 4.0 4.3  Calcium 8.7 - 10.3 mg/dL 9.5 8.9 9.0  Phosphorus - (None) (None) (None)  Creatinine 0.57 - 1.00 mg/dL 0.83 0.79 0.82  AST 0 - 40 IU/L _1 Alk Phos 39 - 117 IU/L 84 48 53  Bilirubin 0.0 - 1.2 mg/dL 0.5 0.5 0.3  Glucose 65 - 99 mg/dL 82 77 86  Cholesterol - (None) (None) (None)  HDL cholesterol >39 mg/dL 58 62 60  Triglycerides 0 - 149 mg/dL 77 73 70  LDL Direct - (None) (None) (None)  LDL Calc 0 - 99 mg/dL 118(H) 104(H) 105(H)  Total protein - (None) (  None) (None)  Albumin 3.5 - 4.8 g/dL 4.2 4.3 4.1  Some recent data might be hidden   Lab Results  Component Value Date   TSH 4.000 09/06/2015   TSH 3.240 03/01/2015   TSH 4.270 08/23/2014   Lab Results  Component Value Date   BUN 14 09/06/2015   BUN 14 03/01/2015   BUN 20 08/23/2014   No results found for: HGBA1C  Assessment/Plan  1. Essential hypertension controlled - Comprehensive metabolic panel; Future  2. Hyperlipidemia, unspecified hyperlipidemia type - Lipid panel; Future  3. Hypothyroidism, unspecified type Normal lab for a year. Not on medication.  4. Dysgeusia monitor

## 2016-04-14 DIAGNOSIS — Z23 Encounter for immunization: Secondary | ICD-10-CM | POA: Diagnosis not present

## 2016-04-24 ENCOUNTER — Encounter: Payer: Self-pay | Admitting: Internal Medicine

## 2016-04-24 LAB — HM DEXA SCAN: HM DEXA SCAN: NORMAL

## 2016-05-11 ENCOUNTER — Encounter: Payer: Self-pay | Admitting: Internal Medicine

## 2016-05-11 ENCOUNTER — Ambulatory Visit (INDEPENDENT_AMBULATORY_CARE_PROVIDER_SITE_OTHER): Payer: Medicare Other | Admitting: Internal Medicine

## 2016-05-11 ENCOUNTER — Encounter (INDEPENDENT_AMBULATORY_CARE_PROVIDER_SITE_OTHER): Payer: Self-pay

## 2016-05-11 VITALS — BP 120/60 | HR 80 | Ht 64.0 in | Wt 150.1 lb

## 2016-05-11 DIAGNOSIS — K219 Gastro-esophageal reflux disease without esophagitis: Secondary | ICD-10-CM | POA: Diagnosis not present

## 2016-05-11 DIAGNOSIS — R159 Full incontinence of feces: Secondary | ICD-10-CM

## 2016-05-11 NOTE — Progress Notes (Signed)
HISTORY OF PRESENT ILLNESS:  Marilyn [name] is a 75 y.o. female, retired Economist, previous patient-doctor Delfin Edis presents today for GI follow-up. Chief complaint today is that of fecal incontinence. Other complaints include increased intestinal gas, rectal prolapse, and GERD with a history of esophageal stricture. Last colonoscopy 2014 with severe diverticulosis. Last upper endoscopy with esophageal dilation 2014. She stays on pantoprazole 40 mg every other day. She denies reflux symptoms. Has never had reflux symptoms. Does not knowledge recurrent dysphagia. She has had some problems with fecal incontinence over the past month. Better over the past week. Inability to discriminate gas from feces. She does take fiber supplementation in the form of fiber pills and fiber powder. No nocturnal incontinence. She does wear protective undergarments. She does have a history of surgery for rectal prolapse. Has noticed more recently some problems with prolapse. No bleeding. She has multiple questions  REVIEW OF SYSTEMS:  All non-GI ROS negative except for back pain, urinary leakage, ankle swelling  Past Medical History:  Diagnosis Date  . Abnormality of gait   . Alopecia, unspecified   . Altered mental status   . Breast cancer ([place])    stage 1; right  . Cataracts, bilateral   . Decreased rectal sphincter tone 07/01/12  . Diverticulosis   . Enterocele   . Esophageal stricture 07/01/12  . Hernia   . Hiatal hernia   . Hypertension   . Hypertension   . IBS (irritable bowel syndrome)   . Irritable bowel syndrome   . Lumbago   . Migraine with aura, without mention of intractable migraine without mention of status migrainosus   . Mixed hearing loss, bilateral   . Obsessive-compulsive personality disorder   . Osteoarthrosis, unspecified whether generalized or localized, unspecified site   . Other and unspecified hyperlipidemia   . Plantar fascial fibromatosis   . Rectal prolapse   .  Reflux esophagitis   . Regional enteritis of small intestine ([place])   . Senile osteoporosis   . Spasm of muscle   . Unspecified constipation   . Unspecified hypothyroidism     Past Surgical History:  Procedure Laterality Date  . APPENDECTOMY    . BASAL CELL CARCINOMA EXCISION  1978   neck  . BREAST LUMPECTOMY Right    snbx, apbi  . Cataract removal OD  06/28/2012   . Cataract removal OS  12/13/2012  . dermatolfibroma  2012  . DILATION AND CURETTAGE OF UTERUS    . ENTEROCELE REPAIR    . HAIR TRANSPLANT  2008  . HERNIA REPAIR    . OOPHORECTOMY    . strabisumus eye surgery     x 2  . TONSILLECTOMY      Social History Marilyn [name]  reports that she has never smoked. She has never used smokeless tobacco. She reports that she does not drink alcohol or use drugs.  family history includes Alzheimer's disease in her father; Breast cancer in her maternal aunt; Gallbladder disease in her brother; Hypertension in her brother and mother; Stroke in her mother.  Allergies  Allergen Reactions  . Cetacaine [Butamben-Tetracaine-Benzocaine]     "loss of taste"       PHYSICAL EXAMINATION: Vital signs: Ht 5\' 4"  (1.626 m) Comment: height measured without shoes  Wt 150 lb 2 oz (68.1 kg)   BMI 25.77 kg/m   Constitutional: generally well-appearing, no acute distress Psychiatric: alert and oriented x3, cooperative Eyes: extraocular movements intact, anicteric, conjunctiva pink Mouth: oral pharynx moist,  no lesions Neck: supple no lymphadenopathy Cardiovascular: heart regular rate and rhythm, no murmur Lungs: clear to auscultation bilaterally Abdomen: soft, nontender, nondistended, no obvious ascites, no peritoneal signs, normal bowel sounds, no organomegaly Rectal: Sensation intact. No hemorrhoids externally. Decreased rectal tone. Soft brown Hemoccult negative stool. No obvious prolapse with Valsalva Extremities: no clubbing cyanosis or lower extremity edema bilaterally Skin: no  lesions on visible extremities Neuro: No focal deficits. Cranial nerves intact  ASSESSMENT:  #1. Fecal incontinence secondary to decreased rectal tone and a history of prolapse with possible recurrent prolapse #2. History severe diverticulosis. Last colonoscopy 2014. No neoplasia #3. GERD, complicated by peptic stricture. Currently asymptomatic post dilation on every other day PPI #4. Increased intestinal gas   PLAN:  #1. Wear protective undergarments #2. increase fiber to improve bowel consistency #3. Reflux precautions #4. Continue PPI. Refill as needed #5. Office follow-up one year  25 minutes spent face-to-face with the patient. Greater than 50% a time use for counseling regarding her GI issues

## 2016-05-11 NOTE — Patient Instructions (Signed)
Please follow up in one year 

## 2016-06-11 ENCOUNTER — Telehealth: Payer: Self-pay

## 2016-06-11 NOTE — Telephone Encounter (Signed)
Message left on clinical intake voicemail requesting Bone Density test results from a month or so ago.  I reviewed record and did not see a recent order placed by Dr.Green for a Bone Density test and the order placed by Dr.Magrinat was expired.  I called patient to inquire about the date of exam, patient was unsure and states it was a month ago. Patient not sure who the ordering provider was or where the results were sent. Patient would like for [place] to retrieve a copy of results and inform her.  Patient informed Dr.Green is out of the office and will return on Monday. Patient would like for the covering doctor to address.  I called [facility] @ [phone] and requested a copy of report. Exam was completed in November 2017

## 2016-06-12 ENCOUNTER — Other Ambulatory Visit: Payer: Self-pay | Admitting: Internal Medicine

## 2016-06-12 ENCOUNTER — Telehealth: Payer: Self-pay | Admitting: Internal Medicine

## 2016-06-12 MED ORDER — HYOSCYAMINE SULFATE 0.125 MG SL SUBL: SUBLINGUAL_TABLET | SUBLINGUAL | 1 refills | Status: DC

## 2016-06-12 NOTE — Telephone Encounter (Signed)
[facility] to be sent [facility].

## 2016-06-12 NOTE — Telephone Encounter (Signed)
Refilled Levsin

## 2016-06-25 ENCOUNTER — Encounter: Payer: Self-pay | Admitting: *Deleted

## 2016-07-03 NOTE — Telephone Encounter (Signed)
Spoke with patient: Based on the WHO criteria this patient has Bone Density within normal limits. Patient was pleased with the report. Will take with Dr. Nyoka Cowden at next visit if she needs to take any other medications to keep at this level.

## 2016-08-26 DIAGNOSIS — Z029 Encounter for administrative examinations, unspecified: Secondary | ICD-10-CM

## 2016-09-02 ENCOUNTER — Encounter: Payer: Self-pay | Admitting: Internal Medicine

## 2016-09-09 ENCOUNTER — Other Ambulatory Visit: Payer: Self-pay | Admitting: Internal Medicine

## 2016-09-22 ENCOUNTER — Other Ambulatory Visit: Payer: Medicare Other

## 2016-09-22 ENCOUNTER — Ambulatory Visit: Payer: Medicare Other

## 2016-09-28 ENCOUNTER — Other Ambulatory Visit: Payer: Medicare Other

## 2016-09-28 ENCOUNTER — Ambulatory Visit (INDEPENDENT_AMBULATORY_CARE_PROVIDER_SITE_OTHER): Payer: Medicare Other

## 2016-09-28 VITALS — BP 122/64 | HR 64 | Temp 97.9°F | Ht 64.0 in | Wt 155.0 lb

## 2016-09-28 DIAGNOSIS — Z0000 Encounter for general adult medical examination without abnormal findings: Secondary | ICD-10-CM | POA: Diagnosis not present

## 2016-09-28 DIAGNOSIS — I10 Essential (primary) hypertension: Secondary | ICD-10-CM

## 2016-09-28 DIAGNOSIS — E785 Hyperlipidemia, unspecified: Secondary | ICD-10-CM

## 2016-09-28 LAB — LIPID PANEL
CHOLESTEROL: 195 mg/dL (ref <200)
HDL: 53 mg/dL (ref >50)
LDL CALC: 125 mg/dL — AB (ref <100)
TRIGLYCERIDES: 85 mg/dL (ref <150)
Total CHOL/HDL Ratio: 3.7 Ratio (ref <5.0)
VLDL: 17 mg/dL (ref <30)

## 2016-09-28 LAB — COMPREHENSIVE METABOLIC PANEL
ALBUMIN: 4.3 g/dL (ref 3.6–5.1)
ALT: 18 U/L (ref 6–29)
AST: 22 U/L (ref 10–35)
Alkaline Phosphatase: 89 U/L (ref 33–130)
BILIRUBIN TOTAL: 0.5 mg/dL (ref 0.2–1.2)
BUN: 18 mg/dL (ref 7–25)
CALCIUM: 9.5 mg/dL (ref 8.6–10.4)
CO2: 29 mmol/L (ref 20–31)
Chloride: 102 mmol/L (ref 98–110)
Creat: 0.77 mg/dL (ref 0.60–0.93)
Glucose, Bld: 86 mg/dL (ref 65–99)
Potassium: 4 mmol/L (ref 3.5–5.3)
Sodium: 140 mmol/L (ref 135–146)
Total Protein: 7.1 g/dL (ref 6.1–8.1)

## 2016-09-28 NOTE — Progress Notes (Signed)
Subjective:   Marilyn [name] is a 76 y.o. female who presents for an Initial Medicare Annual Wellness Visit.       Objective:    Today's Vitals   09/28/16 1024  BP: 122/64  Pulse: 64  Temp: 97.9 F (36.6 C)  TempSrc: Oral  Weight: 155 lb (70.3 kg)  Height: 5\' 4"  (1.626 m)   Body mass index is 26.61 kg/m.   Current Medications (verified) Outpatient Encounter Prescriptions as of 09/28/2016  Medication Sig  . acetaminophen (TYLENOL) 500 MG tablet Take 1 tablets every 6-8 hours as needed for pain but no more than 6 tablets in a day  . alendronate (FOSAMAX) 70 MG tablet TAKE 1 TABLET BY MOUTH EVERY 7 DAYS WITH A GLASS OF WATER ON AN EMPTY STOMACH  . aspirin 81 MG tablet Take 81 mg by mouth daily.  . AVODART 0.5 MG capsule Take 0.5 mg by mouth once a week.   . Cholecalciferol (VITAMIN D-3) 1000 units CAPS Take by mouth. Take one Vitamin D-3 1,000 daily  . flutamide (EULEXIN) 125 MG capsule TAKE 2 CAPSULES EVERY DAY  . hyoscyamine (LEVSIN SL) 0.125 MG SL tablet DISSOLVE 1 TABLET UNDER TONGUE EVERY 4-6 HOURS AS NEEDED FOR DIARRHEA  . losartan (COZAAR) 50 MG tablet Take 1 tablet (50 mg total) by mouth daily.  . Multiple Vitamins-Minerals (MULTIVITAMIN & MINERAL PO) Take 1 tablet by mouth daily.  . pantoprazole (PROTONIX) 40 MG tablet Take 1 tablet (40 mg total) by mouth every other day.  . Probiotic Product (PROBIOTIC DAILY PO) Take by mouth daily.  . vitamin C (ASCORBIC ACID) 500 MG tablet Take 500 mg by mouth daily.  . Wheat Dextrin (BENEFIBER) POWD Take 1 each by mouth daily.   No facility-administered encounter medications on file as of 09/28/2016.     Allergies (verified) Cetacaine [butamben-tetracaine-benzocaine]   History: Past Medical History:  Diagnosis Date  . Abnormality of gait   . Alopecia, unspecified   . Altered mental status   . Breast cancer ([place])    stage 1; right  . Cataracts, bilateral   . Decreased rectal sphincter tone 07/01/12  . Diverticulosis     . Enterocele   . Esophageal stricture 07/01/12  . Hernia   . Hiatal hernia   . Hypertension   . Hypertension   . IBS (irritable bowel syndrome)   . Irritable bowel syndrome   . Lumbago   . Migraine with aura, without mention of intractable migraine without mention of status migrainosus   . Mixed hearing loss, bilateral   . Obsessive-compulsive personality disorder   . Osteoarthrosis, unspecified whether generalized or localized, unspecified site   . Other and unspecified hyperlipidemia   . Plantar fascial fibromatosis   . Rectal prolapse   . Reflux esophagitis   . Regional enteritis of small intestine ([place])   . Senile osteoporosis   . Spasm of muscle   . Unspecified constipation   . Unspecified hypothyroidism    Past Surgical History:  Procedure Laterality Date  . APPENDECTOMY    . BASAL CELL CARCINOMA EXCISION  1978   neck  . BREAST LUMPECTOMY Right    snbx, apbi  . Cataract removal OD  06/28/2012   . Cataract removal OS  12/13/2012  . dermatolfibroma  2012  . DILATION AND CURETTAGE OF UTERUS    . ENTEROCELE REPAIR    . HAIR TRANSPLANT  2008  . HERNIA REPAIR    . OOPHORECTOMY    . strabisumus eye  surgery     x 2  . TONSILLECTOMY     Family History  Problem Relation Age of Onset  . Hypertension Mother   . Stroke Mother   . Alzheimer's disease Father   . Hypertension Brother   . Breast cancer Maternal Aunt   . Gallbladder disease Brother   . Colon cancer Neg Hx    Social History   Occupational History  . retired Economist    Social History Main Topics  . Smoking status: Never Smoker  . Smokeless tobacco: Never Used  . Alcohol use No  . Drug use: No  . Sexual activity: No    Tobacco Counseling Counseling given: Not Answered   Activities of Daily Living No flowsheet data found.  Immunizations and Health Maintenance Immunization History  Administered Date(s) Administered  . DTaP 06/08/1996  . Hepatitis A 10/18/1996, 11/15/1997  .  Influenza,inj,Quad PF,36+ Mos 03/01/2013, 03/02/2014, 03/05/2015, 04/14/2016  . Pneumococcal Conjugate-13 09/10/2015  . Pneumococcal-Unspecified 05/07/2010  . Td 09/04/1981, 02/11/1993, 12/07/2011  . Yellow Fever 11/08/1987  . Zoster 11/30/2006   There are no preventive care reminders to display for this patient.  Patient Care Team: Estill Dooms, MD as PCP - General (Internal Medicine) Amy Carles Collet, MD (Dermatology) Lafayette Dragon, MD (Inactive) as Consulting Physician (Gastroenterology) Chauncey Cruel, MD as Consulting Physician (Oncology) Shon Hough, MD as Consulting Physician (Ophthalmology) Leta Baptist, MD as Consulting Physician (Otolaryngology) Rolm Bookbinder, MD as Consulting Physician (General Surgery)  Indicate any recent Medical Services you may have received from other than [facility] providers in the past year (date may be approximate).     Assessment:   This is a routine wellness examination for Marilyn [name].   Hearing/Vision screen No exam data present  Dietary issues and exercise activities discussed:    Goals    None     Depression Screen PHQ 2/9 Scores 09/10/2015 03/05/2015 08/29/2014 04/24/2014 06/14/2013 11/15/2012  PHQ - 2 Score 0 0 0 0 0 0    Fall Risk Fall Risk  09/10/2015 03/05/2015 08/29/2014 04/24/2014 06/14/2013  Falls in the past year? No No No No No    Cognitive Function: MMSE - Mini Mental State Exam 09/10/2015 04/24/2014  Orientation to time 5 5  Orientation to Place 5 5  Registration 3 3  Attention/ Calculation 5 5  Recall 3 2  Language- name 2 objects 2 2  Language- repeat 1 1  Language- follow 3 step command 3 3  Language- read & follow direction 1 1  Write a sentence 1 1  Copy design 1 1  Total score 30 29        Screening Tests Health Maintenance  Topic Date Due  . MAMMOGRAM  11/04/2016  . INFLUENZA VACCINE  01/06/2017  . TETANUS/TDAP  12/06/2021  . DEXA SCAN  Completed  . PNA vac Low Risk Adult  Completed      Plan:    I  have personally reviewed and addressed the Medicare Annual Wellness questionnaire and have noted the following in the patient's chart:  A. Medical and social history B. Use of alcohol, tobacco or illicit drugs  C. Current medications and supplements D. Functional ability and status E.  Nutritional status F.  Physical activity G. Advance directives H. List of other physicians I.  Hospitalizations, surgeries, and ER visits in previous 12 months J.  [place] to include hearing, vision, cognitive, depression L. Referrals and appointments - none  In addition, I have reviewed  and discussed with patient certain preventive protocols, quality metrics, and best practice recommendations. A written personalized care plan for preventive services as well as general preventive health recommendations were provided to patient.  See attached scanned questionnaire for additional information.   Signed,   Rich Reining, RN Nurse Health Advisor

## 2016-09-28 NOTE — Patient Instructions (Signed)
Marilyn [name] , Thank you for taking time to come for your Medicare Wellness Visit. I appreciate your ongoing commitment to your health goals. Please review the following plan we discussed and let me know if I can assist you in the future.   Screening recommendations/referrals: Colonoscopy not due until 07/01/2022. I have put in a note for Dr. Nyoka Cowden to let you know if he recommends anything differently Mammogram Due 11/04/2016 Bone Density up to date Recommended yearly ophthalmology/optometry visit for glaucoma screening and checkup Recommended yearly dental visit for hygiene and checkup  Vaccinations: Influenza vaccine up to date Pneumococcal vaccine up to date Tdap vaccine up to date. Due 12/06/2021 Shingles vaccine up to date. If you decide you want Shingrex let us know and we will put in prescription  Advanced directives: Advance directive discussed with you today. I have provided a copy for you to complete at home and have notarized. Once this is complete please bring a copy in to our office so we can scan it into your chart.  Conditions/risks identified: None  Next appointment: 10/07/2016 Green @ 1pm   Preventive Care 77 Years and Older, Female Preventive care refers to lifestyle choices and visits with your health care provider that can promote health and wellness. What does preventive care include?  A yearly physical exam. This is also called an annual well check.  Dental exams once or twice a year.  Routine eye exams. Ask your health care provider how often you should have your eyes checked.  Personal lifestyle choices, including:  Daily care of your teeth and gums.  Regular physical activity.  Eating a healthy diet.  Avoiding tobacco and drug use.  Limiting alcohol use.  Practicing safe sex.  Taking low-dose aspirin every day.  Taking vitamin and mineral supplements as recommended by your health care provider. What happens during an annual well check? The services  and screenings done by your health care provider during your annual well check will depend on your age, overall health, lifestyle risk factors, and family history of disease. Counseling  Your health care provider may ask you questions about your:  Alcohol use.  Tobacco use.  Drug use.  Emotional well-being.  Home and relationship well-being.  Sexual activity.  Eating habits.  History of falls.  Memory and ability to understand (cognition).  Work and work Statistician.  Reproductive health. Screening  You may have the following tests or measurements:  Height, weight, and BMI.  Blood pressure.  Lipid and cholesterol levels. These may be checked every 5 years, or more frequently if you are over 19 years old.  Skin check.  Lung cancer screening. You may have this screening every year starting at age 73 if you have a 30-pack-year history of smoking and currently smoke or have quit within the past 15 years.  Fecal occult blood test (FOBT) of the stool. You may have this test every year starting at age 56.  Flexible sigmoidoscopy or colonoscopy. You may have a sigmoidoscopy every 5 years or a colonoscopy every 10 years starting at age 19.  Hepatitis C blood test.  Hepatitis B blood test.  Sexually transmitted disease (STD) testing.  Diabetes screening. This is done by checking your blood sugar (glucose) after you have not eaten for a while (fasting). You may have this done every 1-3 years.  Bone density scan. This is done to screen for osteoporosis. You may have this done starting at age 43.  Mammogram. This may be done every 1-2 years.  Talk to your health care provider about how often you should have regular mammograms. Talk with your health care provider about your test results, treatment options, and if necessary, the need for more tests. Vaccines  Your health care provider may recommend certain vaccines, such as:  Influenza vaccine. This is recommended every  year.  Tetanus, diphtheria, and acellular pertussis (Tdap, Td) vaccine. You may need a Td booster every 10 years.  Zoster vaccine. You may need this after age 96.  Pneumococcal 13-valent conjugate (PCV13) vaccine. One dose is recommended after age 62.  Pneumococcal polysaccharide (PPSV23) vaccine. One dose is recommended after age 78. Talk to your health care provider about which screenings and vaccines you need and how often you need them. This information is not intended to replace advice given to you by your health care provider. Make sure you discuss any questions you have with your health care provider. Document Released: 06/21/2015 Document Revised: 02/12/2016 Document Reviewed: 03/26/2015 Elsevier Interactive Patient Education  2017 [place] Prevention in the Home Falls can cause injuries. They can happen to people of all ages. There are many things you can do to make your home safe and to help prevent falls. What can I do on the outside of my home?  Regularly fix the edges of walkways and driveways and fix any cracks.  Remove anything that might make you trip as you walk through a door, such as a raised step or threshold.  Trim any bushes or trees on the path to your home.  Use bright outdoor lighting.  Clear any walking paths of anything that might make someone trip, such as rocks or tools.  Regularly check to see if handrails are loose or broken. Make sure that both sides of any steps have handrails.  Any raised decks and porches should have guardrails on the edges.  Have any leaves, snow, or ice cleared regularly.  Use sand or salt on walking paths during winter.  Clean up any spills in your garage right away. This includes oil or grease spills. What can I do in the bathroom?  Use night lights.  Install grab bars by the toilet and in the tub and shower. Do not use towel bars as grab bars.  Use non-skid mats or decals in the tub or shower.  If you  need to sit down in the shower, use a plastic, non-slip stool.  Keep the floor dry. Clean up any water that spills on the floor as soon as it happens.  Remove soap buildup in the tub or shower regularly.  Attach bath mats securely with double-sided non-slip rug tape.  Do not have throw rugs and other things on the floor that can make you trip. What can I do in the bedroom?  Use night lights.  Make sure that you have a light by your bed that is easy to reach.  Do not use any sheets or blankets that are too big for your bed. They should not hang down onto the floor.  Have a firm chair that has side arms. You can use this for support while you get dressed.  Do not have throw rugs and other things on the floor that can make you trip. What can I do in the kitchen?  Clean up any spills right away.  Avoid walking on wet floors.  Keep items that you use a lot in easy-to-reach places.  If you need to reach something above you, use a strong step stool  that has a grab bar.  Keep electrical cords out of the way.  Do not use floor polish or wax that makes floors slippery. If you must use wax, use non-skid floor wax.  Do not have throw rugs and other things on the floor that can make you trip. What can I do with my stairs?  Do not leave any items on the stairs.  Make sure that there are handrails on both sides of the stairs and use them. Fix handrails that are broken or loose. Make sure that handrails are as long as the stairways.  Check any carpeting to make sure that it is firmly attached to the stairs. Fix any carpet that is loose or worn.  Avoid having throw rugs at the top or bottom of the stairs. If you do have throw rugs, attach them to the floor with carpet tape.  Make sure that you have a light switch at the top of the stairs and the bottom of the stairs. If you do not have them, ask someone to add them for you. What else can I do to help prevent falls?  Wear shoes  that:  Do not have high heels.  Have rubber bottoms.  Are comfortable and fit you well.  Are closed at the toe. Do not wear sandals.  If you use a stepladder:  Make sure that it is fully opened. Do not climb a closed stepladder.  Make sure that both sides of the stepladder are locked into place.  Ask someone to hold it for you, if possible.  Clearly mark and make sure that you can see:  Any grab bars or handrails.  First and last steps.  Where the edge of each step is.  Use tools that help you move around (mobility aids) if they are needed. These include:  Canes.  Walkers.  Scooters.  Crutches.  Turn on the lights when you go into a dark area. Replace any light bulbs as soon as they burn out.  Set up your furniture so you have a clear path. Avoid moving your furniture around.  If any of your floors are uneven, fix them.  If there are any pets around you, be aware of where they are.  Review your medicines with your doctor. Some medicines can make you feel dizzy. This can increase your chance of falling. Ask your doctor what other things that you can do to help prevent falls. This information is not intended to replace advice given to you by your health care provider. Make sure you discuss any questions you have with your health care provider. Document Released: 03/21/2009 Document Revised: 10/31/2015 Document Reviewed: 06/29/2014 Elsevier Interactive Patient Education  2017 [employer].

## 2016-09-28 NOTE — Progress Notes (Signed)
   I reviewed health advisor's note, was available for consultation and agree with the assessment and plan as written.    Reyli Schroth L. Forest Pruden, D.O. [facility] [address], [place] [address] Cell Phone (Mon-Fri 8am-5pm):  [phone] On Call:  [phone] & follow prompts after 5pm & weekends Office Phone:  [phone] Office Fax:  [phone]   Quick Notes   Health Maintenance: Pt currently not due for colonoscopy because she is over 69 years of age. She has an extensive GI hx so I'm not sure if she should have another one. She said in the past they have told her she may need to have it less than every 10 years     Abnormal Screen: MMSE 30/30. Did not pass clock drawing     Patient Concerns: None     Nurse Concerns: None

## 2016-09-30 ENCOUNTER — Other Ambulatory Visit: Payer: Medicare Other

## 2016-09-30 ENCOUNTER — Ambulatory Visit: Payer: Medicare Other

## 2016-10-05 ENCOUNTER — Other Ambulatory Visit: Payer: Medicare Other

## 2016-10-07 ENCOUNTER — Ambulatory Visit (INDEPENDENT_AMBULATORY_CARE_PROVIDER_SITE_OTHER): Payer: Medicare Other | Admitting: Internal Medicine

## 2016-10-07 ENCOUNTER — Encounter: Payer: Self-pay | Admitting: Internal Medicine

## 2016-10-07 VITALS — BP 144/74 | HR 67 | Temp 98.1°F | Ht 64.0 in | Wt 155.0 lb

## 2016-10-07 DIAGNOSIS — E039 Hypothyroidism, unspecified: Secondary | ICD-10-CM | POA: Diagnosis not present

## 2016-10-07 DIAGNOSIS — M25549 Pain in joints of unspecified hand: Secondary | ICD-10-CM

## 2016-10-07 DIAGNOSIS — N3941 Urge incontinence: Secondary | ICD-10-CM

## 2016-10-07 DIAGNOSIS — I10 Essential (primary) hypertension: Secondary | ICD-10-CM

## 2016-10-07 DIAGNOSIS — K2100 Gastro-esophageal reflux disease with esophagitis, without bleeding: Secondary | ICD-10-CM

## 2016-10-07 DIAGNOSIS — R32 Unspecified urinary incontinence: Secondary | ICD-10-CM | POA: Insufficient documentation

## 2016-10-07 DIAGNOSIS — K222 Esophageal obstruction: Secondary | ICD-10-CM | POA: Diagnosis not present

## 2016-10-07 DIAGNOSIS — M79646 Pain in unspecified finger(s): Secondary | ICD-10-CM

## 2016-10-07 DIAGNOSIS — K623 Rectal prolapse: Secondary | ICD-10-CM

## 2016-10-07 DIAGNOSIS — R609 Edema, unspecified: Secondary | ICD-10-CM

## 2016-10-07 DIAGNOSIS — E785 Hyperlipidemia, unspecified: Secondary | ICD-10-CM

## 2016-10-07 DIAGNOSIS — R432 Parageusia: Secondary | ICD-10-CM

## 2016-10-07 MED ORDER — LIDOCAINE 4 % EX CREA: TOPICAL_CREAM | CUTANEOUS | 5 refills | Status: DC

## 2016-10-07 NOTE — Patient Instructions (Signed)
[facility]

## 2016-10-07 NOTE — Progress Notes (Signed)
Facility  [place]    Place of Service:   OFFICE    Allergies  Allergen Reactions  . Cetacaine [Butamben-Tetracaine-Benzocaine]     "loss of taste"    Chief Complaint  Patient presents with  . Medical Management of Chronic Issues    Medical Management on Blood pressure, hyperlipidemia, thyroid, dysgeusia. Review labs.     HPI:   Essential hypertension - controlled  Hyperlipidemia, unspecified hyperlipidemia type - controlled  Hypothyroidism, unspecified type - compensated  Dysgeusia - resolved after a month. She attributes to cetacaine.  Reflux esophagitis - mild dysphagia  Esophageal stricture -  Mild dysphagia  Edema, unspecified type - improved  Rectal prolapse - has seen her previous surgeon at [facility], Dr. Johney Frame. She recommended a pessary.  Urge incontinence of urine - using pads  Pain in thumb joint with movement, unspecified laterality - asking about a topical pain medication      Medications: Patient's Medications  New Prescriptions   No medications on file  Previous Medications   ACETAMINOPHEN (TYLENOL) 500 MG TABLET    Take 1 tablets every 6-8 hours as needed for pain but no more than 6 tablets in a day   ASPIRIN 81 MG TABLET    Take 81 mg by mouth daily.   AVODART 0.5 MG CAPSULE    Take 0.5 mg by mouth once a week.    CHOLECALCIFEROL (VITAMIN D-3) 1000 UNITS CAPS    Take by mouth. Take one Vitamin D-3 1,000 daily   DIPHENHYDRAMINE (BENADRYL) 25 MG TABLET    Take 25 mg by mouth. Take one at bedtime to help with sleep as needed   FLUTAMIDE (EULEXIN) 125 MG CAPSULE    TAKE 2 CAPSULES EVERY DAY   HYOSCYAMINE (LEVSIN SL) 0.125 MG SL TABLET    DISSOLVE 1 TABLET UNDER TONGUE EVERY 4-6 HOURS AS NEEDED FOR DIARRHEA   LOSARTAN (COZAAR) 50 MG TABLET    Take 1 tablet (50 mg total) by mouth daily.   MULTIPLE VITAMINS-MINERALS (MULTIVITAMIN & MINERAL PO)    Take 1 tablet by mouth daily.   NON FORMULARY    Fiber pill take 2 twice daily   PANTOPRAZOLE  (PROTONIX) 40 MG TABLET    Take 1 tablet (40 mg total) by mouth every other day.   VITAMIN C (ASCORBIC ACID) 500 MG TABLET    Take 500 mg by mouth daily.   WHEAT DEXTRIN (BENEFIBER) POWD    Take 1 each by mouth daily.  Modified Medications   No medications on file  Discontinued Medications   DIPHENHYDRAMINE-ACETAMINOPHEN (TYLENOL PM) 25-500 MG TABS TABLET    Take 1 tablet by mouth at bedtime as needed.   TERBINAFINE (LAMISIL) 250 MG TABLET    2 (two) times daily.    Review of Systems  Constitutional: Negative for activity change, appetite change, chills, diaphoresis, fatigue, fever and unexpected weight change.       History of breast cancer.  HENT: Positive for hearing loss (left ear) and tinnitus. Negative for congestion, ear discharge, ear pain, postnasal drip, rhinorrhea, sore throat, trouble swallowing and voice change.   Eyes: Positive for visual disturbance (corrective lenses). Negative for pain, redness and itching.  Respiratory: Negative for cough, choking, shortness of breath and wheezing.   Cardiovascular: Positive for leg swelling. Negative for chest pain and palpitations.  Gastrointestinal: Negative for abdominal distention, abdominal pain, constipation, diarrhea and nausea.       Hx IBS. Rare episodes. Only a couple of times.  Endocrine: Negative.  Negative for cold intolerance, heat intolerance, polydipsia, polyphagia and polyuria.  Genitourinary: Negative for difficulty urinating, dysuria, flank pain, frequency, hematuria, pelvic pain, urgency and vaginal discharge.       Esophageal scarring and stricture in the past.  Musculoskeletal: Negative.  Negative for arthralgias, back pain, gait problem, myalgias, neck pain and neck stiffness.  Skin: Negative for color change, pallor and rash.       Hx of eczema  Allergic/Immunologic: Negative.   Neurological: Positive for dizziness. Negative for tremors, seizures, syncope, weakness, numbness and headaches.       Some difficulty  with balance  Hematological: Negative.  Negative for adenopathy. Does not bruise/bleed easily.  Psychiatric/Behavioral: Negative for agitation, behavioral problems, confusion, dysphoric mood, hallucinations, sleep disturbance and suicidal ideas. The patient is not nervous/anxious and is not hyperactive.        Hx of mild obsessive and hoarding disorder    Vitals:   10/07/16 1314  BP: (!) 144/74  Pulse: 67  Temp: 98.1 F (36.7 C)  SpO2: 97%  Weight: 155 lb (70.3 kg)  Height: 5' 4"  (1.626 m)   Body mass index is 26.61 kg/m. Wt Readings from Last 3 Encounters:  10/07/16 155 lb (70.3 kg)  09/28/16 155 lb (70.3 kg)  05/11/16 150 lb 2 oz (68.1 kg)      Physical Exam  Constitutional: She is oriented to person, place, and time. She appears well-developed and well-nourished. No distress.  HENT:  Head: Normocephalic and atraumatic.  Right Ear: External ear normal.  Left Ear: External ear normal.  Nose: Nose normal.  Mouth/Throat: Oropharynx is clear and moist. No oropharyngeal exudate.  Eyes: Conjunctivae and EOM are normal. Pupils are equal, round, and reactive to light. No scleral icterus.  Mild pupil irregularity due to cataract extraction of the right eye  Neck: No JVD present. No tracheal deviation present. No thyromegaly present.  Cardiovascular: Normal rate, regular rhythm, normal heart sounds and intact distal pulses.  Exam reveals no gallop and no friction rub.   No murmur heard. Pulmonary/Chest: Effort normal. No respiratory distress. She has no wheezes. She has no rales. She exhibits no tenderness.  Abdominal: She exhibits no distension and no mass. There is no tenderness.  Genitourinary:  Genitourinary Comments: Done by Dr. Dory Horn  Musculoskeletal: Normal range of motion. She exhibits edema. She exhibits no tenderness.  Pain in thumb joints  Lymphadenopathy:    She has no cervical adenopathy.  Neurological: She is alert and oriented to person, place, and time. She  has normal reflexes. No cranial nerve deficit. Coordination normal.  04/24/14 MMSE 29/30. Failed clock drawing. 09/10/15 MMSE 30/30. Failed clock drawing.  Skin: Rash (eczema) noted. She is not diaphoretic. No erythema. No pallor.  Psychiatric: She has a normal mood and affect. Her behavior is normal. Judgment and thought content normal.    Labs reviewed: Lab Summary Latest Ref Rng & Units 09/28/2016 09/06/2015 03/01/2015  Hemoglobin 11.1 - 15.9 g/dL (None) (None) 12.8  Hematocrit 34.0 - 46.6 % (None) (None) 39.9  White count 3.4 - 10.8 x10E3/uL (None) (None) 4.5  Platelet count 150 - 379 x10E3/uL (None) (None) 335  Sodium 135 - 146 mmol/L 140 142 141  Potassium 3.5 - 5.3 mmol/L 4.0 4.2 4.0  Calcium 8.6 - 10.4 mg/dL 9.5 9.5 8.9  Phosphorus - (None) (None) (None)  Creatinine 0.60 - 0.93 mg/dL 0.77 0.83 0.79  AST 10 - 35 U/L 22 28 18   Alk Phos 33 - 130 U/L  89 84 48  Bilirubin 0.2 - 1.2 mg/dL 0.5 0.5 0.5  Glucose 65 - 99 mg/dL 86 82 77  Cholesterol <200 mg/dL 195 (None) (None)  HDL cholesterol >50 mg/dL 53 58 62  Triglycerides <150 mg/dL 85 77 73  LDL Direct - (None) (None) (None)  LDL Calc <100 mg/dL 125(H) 118(H) 104(H)  Total protein 6.1 - 8.1 g/dL 7.1 (None) (None)  Albumin 3.6 - 5.1 g/dL 4.3 4.2 4.3  Some recent data might be hidden   Lab Results  Component Value Date   TSH 4.000 09/06/2015   TSH 3.240 03/01/2015   TSH 4.270 08/23/2014   Lab Results  Component Value Date   BUN 18 09/28/2016   BUN 14 09/06/2015   BUN 14 03/01/2015   No results found for: HGBA1C  Assessment/Plan  1. Essential hypertension Controlled with losartan  2. Hyperlipidemia, unspecified hyperlipidemia type Dietary control. Slightly high LDL, but I think if she keeps it < 130 that is reasonable control.  3. Hypothyroidism, unspecified type Not medicated. Hx of elevated TSH  4. Dysgeusia resolved  5. Reflux esophagitis Mild discomfort sometimes with swallowing. Encouraged her to speak to  her GI doctor  6. Esophageal stricture Hx of dilation  7. Edema, unspecified type Improved of amlodipine  8. Rectal prolapse Continue with Dr. Sharlett Iles and Henrene Pastor  9. Urge incontinence of urine Pessary has been recommended  10. Pain in thumb joint with movement, unspecified laterality - lidocaine (ASPERCREME W/LIDOCAINE) 4 % cream; Apply 4 times daily to painful joints  Dispense: 133 g; Refill: 5

## 2016-11-19 ENCOUNTER — Telehealth: Payer: Self-pay | Admitting: *Deleted

## 2016-11-19 NOTE — Telephone Encounter (Signed)
Patient called and left message on clinical intake line and stated that she dropped off a letter last week for Dr. Nyoka Cowden regarding her denial of insurance. She stated that she had you write a letter and due to the Medical history the insurance was denied. She was just calling to see if you received the letter. Please Advise.   I tried calling the patient back but it went to voicemail and recording stated that tape was full and could not leave message.

## 2016-11-20 ENCOUNTER — Telehealth: Payer: Self-pay

## 2016-11-20 NOTE — Telephone Encounter (Signed)
Pt recently had a pessary placed to help support her bladder. The urologist wants her to start premarin vaginal cream to keep tissues supported. She is asking Dr Virgie Dad opinion about this in light of her breast cancer history.  Told pt MD is out of office until Monday and we will get back to her.

## 2016-11-20 NOTE — Telephone Encounter (Signed)
Marilyn [name] called today to see if Dr. Nyoka Cowden had completed letter that she requested regarding her insurance company. I spoke with Dr. Nyoka Cowden and he stated that he had the paperwork and would complete the letter.

## 2016-11-24 ENCOUNTER — Encounter: Payer: Self-pay | Admitting: Internal Medicine

## 2016-11-24 NOTE — Telephone Encounter (Signed)
Letter completed and patient notified. Placed up front for pick up.

## 2017-01-01 ENCOUNTER — Other Ambulatory Visit: Payer: Self-pay | Admitting: Internal Medicine

## 2017-01-01 DIAGNOSIS — I10 Essential (primary) hypertension: Secondary | ICD-10-CM

## 2017-01-21 ENCOUNTER — Telehealth: Payer: Self-pay | Admitting: *Deleted

## 2017-01-21 NOTE — Telephone Encounter (Signed)
Patient called requesting a copy of the letter Dr. Nyoka Cowden wrote on 11/24/16 for her insurance. Copy printed and mailed to patient.

## 2017-01-29 ENCOUNTER — Other Ambulatory Visit: Payer: Self-pay | Admitting: Internal Medicine

## 2017-03-30 ENCOUNTER — Other Ambulatory Visit: Payer: Self-pay | Admitting: Internal Medicine

## 2017-03-30 DIAGNOSIS — I10 Essential (primary) hypertension: Secondary | ICD-10-CM

## 2017-04-12 ENCOUNTER — Encounter: Payer: Self-pay | Admitting: Internal Medicine

## 2017-04-12 ENCOUNTER — Ambulatory Visit: Payer: Medicare Other | Admitting: Internal Medicine

## 2017-04-12 VITALS — BP 136/72 | HR 66 | Temp 98.5°F | Wt 157.0 lb

## 2017-04-12 DIAGNOSIS — K222 Esophageal obstruction: Secondary | ICD-10-CM

## 2017-04-12 DIAGNOSIS — K580 Irritable bowel syndrome with diarrhea: Secondary | ICD-10-CM | POA: Diagnosis not present

## 2017-04-12 DIAGNOSIS — M8589 Other specified disorders of bone density and structure, multiple sites: Secondary | ICD-10-CM | POA: Diagnosis not present

## 2017-04-12 DIAGNOSIS — K623 Rectal prolapse: Secondary | ICD-10-CM

## 2017-04-12 DIAGNOSIS — M542 Cervicalgia: Secondary | ICD-10-CM | POA: Diagnosis not present

## 2017-04-12 DIAGNOSIS — Z853 Personal history of malignant neoplasm of breast: Secondary | ICD-10-CM | POA: Diagnosis not present

## 2017-04-12 DIAGNOSIS — E7800 Pure hypercholesterolemia, unspecified: Secondary | ICD-10-CM | POA: Diagnosis not present

## 2017-04-12 DIAGNOSIS — L658 Other specified nonscarring hair loss: Secondary | ICD-10-CM

## 2017-04-12 DIAGNOSIS — I10 Essential (primary) hypertension: Secondary | ICD-10-CM | POA: Diagnosis not present

## 2017-04-12 MED ORDER — TRAMADOL HCL 50 MG PO TABS: 50.0000 mg | ORAL_TABLET | Freq: Every day | ORAL | 0 refills | Status: DC | PRN

## 2017-04-12 NOTE — Progress Notes (Signed)
 Location[facility]  [facility] Provider:   L. , D.O., C.M.D.  Code Status: will discuss at next visit Goals of Care:  Advanced Directives 04/12/2017  Does Patient Have a Medical Advance Directive? -  Type of Advance Directive Healthcare Power of Attorney  Does patient want to make changes to medical advance directive? -  Copy of Healthcare Power of Attorney in Chart? -  Would patient like information on creating a medical advance directive? -   Chief Complaint  Patient presents with  . Medical Management of Chronic Issues    6mth follow-up, transfer from Dr. Green    HPI: Patient is a 76 y.o. female seen today for medical management of chronic diseases.    HTN:  BP at goal on losartan 50mg po daily only.  BP better since I adjusted her losartan.    IBS: Dr. Brodie retired and now sees Dr. Perry, diagnosed in 2001.  On levsin SL and uses fiber pill.   Not nearly so bad as when she worked--had explosive diarrhea back then.  Is due for cscope.  She does have some fecal incontinence.  Dr. Perry had recommended she increase her fiber.  Had surgery with Dr. Widener at [facility] for enterocele repair.  Had gone back last year and she noticed her pelvic floor had shifted.  Says it hurts like hell for placement and cleaning of the pessary.  Dr. Neal does it for her locally.  Supposed to use the premarin cream 2x per week, but often only happens 1-2x in 2 wks.  Twice now, the pads she wears for urinary incontinence will show tiny drops of blood.   Not near vagina but up in front.    Initially she thought she's have to go to the bathroom and it would be all gas.  She'd go hiking an hour or two away, and had an accident once and carried a towel to a restaurant with concern for an accident.  Notices worse if she eats too many grapes.  Group goes out to eat when they go to [place] at Christmas.  The fecal incontinence was what took her back to Dr. Widener more than the urinary incontinence.  Will sit and  sit and not always pass urine.  Only has dribbles of urine.  She's not eager to have any surgery.  Takes gas-x if flatulence bothersome.    GERD, esophageal stricture:  On protonix.  Does not have GERD symptoms, just the findings on EGD.  Has restricted her diet to one coke and one coffee per day, less acidic foods.    Hypothyroid: per problem list, but not on thyroid medication.  In history.  TSH was a bit high in a lab at one point, but was watched for a while and normalized.    Hyperlipidemia:  Not on medication.   Lab Results  Component Value Date   CHOL 195 09/28/2016   HDL 53 09/28/2016   LDLCALC 125 (H) 09/28/2016   TRIG 85 09/28/2016   CHOLHDL 3.7 09/28/2016   Breast cancer RUOQ biopsy 07/09/09 invasive ductal ca and high-grade DCIS, ER/PR positive, HER-2-negative.  Had left lumpectomy and sentinel node sampling 08/19/09 with invasive ductal ca grade 1 with negative 4 lymph nodes--negative margins and no lymphovascular invasion.  XRT with Dr. Murray 3/21 and 08/30/09.  She completed more than  5 yrs of tamoxifen late September 2016.    Using benadryl for sleep, not every night, but often.  Falls asleep with two dogs in   recliner.  Takes it to fall back to sleep after lets dogs out.    Flutamide and Minoxidil are for alopecia--Dr. Amy McMichael at [facility].    Osteopenia:  On Vitamin D.  Says she did have osteoporosis at one time and was on fosamax, but stopped when the tamoxifen was stopped.  Off fosamax more than 1 year now.  Dr. Magrinat was following her.    New issue:  Neck has been bothersome.  If she's outside bending over picking up twigs, leaning down over computer, it aches and leads to headache.  Also pops a lot.  Reports it's been confirmed she has arthritis other places.  Takes tylenol.  Used tramadol some for low back pain long ago.  Didn't use it for 4 years.  Is occasionally taking one at night now.  Had a previous problem with C5 when she was 20 or so in college  from studying.  May be related.    Got her first shingrix vaccine.  Now on [facility]' list.  Past Medical History:  Diagnosis Date  . Abnormality of gait   . Alopecia, unspecified   . Altered mental status   . Breast cancer (HCC)    stage 1; right  . Cataracts, bilateral   . Decreased rectal sphincter tone 07/01/12  . Diverticulosis   . Enterocele   . Esophageal stricture 07/01/12  . Hernia   . Hiatal hernia   . Hypertension   . IBS (irritable bowel syndrome)   . Irritable bowel syndrome   . Lumbago   . Migraine with aura, without mention of intractable migraine without mention of status migrainosus   . Mixed hearing loss, bilateral   . Obsessive-compulsive personality disorder   . Osteoarthrosis, unspecified whether generalized or localized, unspecified site   . Other and unspecified hyperlipidemia   . Plantar fascial fibromatosis   . Rectal prolapse   . Reflux esophagitis   . Regional enteritis of small intestine (HCC)   . Senile osteoporosis   . Spasm of muscle   . Unspecified constipation   . Unspecified hypothyroidism     Past Surgical History:  Procedure Laterality Date  . APPENDECTOMY    . BASAL CELL CARCINOMA EXCISION  1978   neck  . BREAST LUMPECTOMY Right    snbx, apbi  . Cataract removal OD  06/28/2012   . Cataract removal OS  12/13/2012  . dermatolfibroma  2012  . DILATION AND CURETTAGE OF UTERUS    . ENTEROCELE REPAIR    . HAIR TRANSPLANT  2008  . HERNIA REPAIR    . OOPHORECTOMY    . strabisumus eye surgery     x 2  . TONSILLECTOMY      Allergies  Allergen Reactions  . Cetacaine [Butamben-Tetracaine-Benzocaine]     "loss of taste"    Outpatient Encounter Medications as of 04/12/2017  Medication Sig  . acetaminophen (TYLENOL) 500 MG tablet Take 1 tablets every 6-8 hours as needed for pain but no more than 6 tablets in a day  . aspirin 81 MG tablet Take 81 mg by mouth daily.  . AVODART 0.5 MG capsule Take 0.5 mg by mouth once a week.   .  Cholecalciferol (VITAMIN D-3) 1000 units CAPS Take by mouth. Take one Vitamin D-3 1,000 daily  . diphenhydrAMINE (BENADRYL) 25 MG tablet Take 25 mg by mouth. Take one at bedtime to help with sleep as needed  . flutamide (EULEXIN) 125 MG capsule TAKE 2 CAPSULES EVERY DAY  .   hyoscyamine (LEVSIN SL) 0.125 MG SL tablet DISSOLVE 1 TABLET UNDER TONGUE EVERY 4-6 HOURS AS NEEDED FOR DIARRHEA  . lidocaine (ASPERCREME W/LIDOCAINE) 4 % cream Apply 4 times daily to painful joints  . losartan (COZAAR) 50 MG tablet TAKE 1 TABLET BY MOUTH EVERY DAY  . Multiple Vitamins-Minerals (MULTIVITAMIN & MINERAL PO) Take 1 tablet by mouth daily.  . NON FORMULARY Fiber pill take 2 twice daily  . pantoprazole (PROTONIX) 40 MG tablet TAKE 1 TABLET (40 MG TOTAL) BY MOUTH EVERY OTHER DAY.  . vitamin C (ASCORBIC ACID) 500 MG tablet Take 500 mg by mouth daily.  . Wheat Dextrin (BENEFIBER) POWD Take 1 each by mouth daily.   No facility-administered encounter medications on file as of 04/12/2017.     Review of Systems:  Review of Systems  Constitutional: Negative for chills, fever and malaise/fatigue.  HENT: Negative for congestion and hearing loss.   Eyes: Negative for blurred vision.  Respiratory: Negative for cough and shortness of breath.   Cardiovascular: Positive for leg swelling. Negative for chest pain and palpitations.       Sometimes end of day or high sodium or heat  Gastrointestinal: Positive for diarrhea. Negative for abdominal pain, blood in stool, constipation, melena, nausea and vomiting.  Genitourinary: Positive for frequency. Negative for dysuria and urgency.  Musculoskeletal: Positive for neck pain. Negative for falls.  Neurological: Negative for dizziness, loss of consciousness and weakness.  Endo/Heme/Allergies: Bruises/bleeds easily.  Psychiatric/Behavioral: Negative for depression and memory loss. The patient does not have insomnia.        OCD per chart    Health Maintenance  Topic Date Due  .  MAMMOGRAM  11/04/2016  . TETANUS/TDAP  12/06/2021  . INFLUENZA VACCINE  Completed  . DEXA SCAN  Completed  . PNA vac Low Risk Adult  Completed    Physical Exam: Vitals:   04/12/17 1258  Weight: 157 lb (71.2 kg)   Body mass index is 26.95 kg/m. Physical Exam  Constitutional: She is oriented to person, place, and time. She appears well-developed and well-nourished. No distress.  HENT:  Head: Normocephalic and atraumatic.  Eyes:  glasses  Cardiovascular: Normal rate, regular rhythm, normal heart sounds and intact distal pulses.  Pulmonary/Chest: Effort normal and breath sounds normal. No respiratory distress.  Abdominal: Soft. Bowel sounds are normal.  Musculoskeletal:  Decreased ROM neck to left and sidebending left; also decreased extension, palpable protruding vertebra at C5  Neurological: She is alert and oriented to person, place, and time. No cranial nerve deficit.  Skin: Skin is warm and dry.  Psychiatric: She has a normal mood and affect. Her behavior is normal. Judgment and thought content normal.    Labs reviewed: Basic Metabolic Panel: Recent Labs    09/28/16 1000  NA 140  K 4.0  CL 102  CO2 29  GLUCOSE 86  BUN 18  CREATININE 0.77  CALCIUM 9.5   Liver Function Tests: Recent Labs    09/28/16 1000  AST 22  ALT 18  ALKPHOS 89  BILITOT 0.5  PROT 7.1  ALBUMIN 4.3   No results for input(s): LIPASE, AMYLASE in the last 8760 hours. No results for input(s): AMMONIA in the last 8760 hours. CBC: No results for input(s): WBC, NEUTROABS, HGB, HCT, MCV, PLT in the last 8760 hours. Lipid Panel: Recent Labs    09/28/16 1000  CHOL 195  HDL 53  LDLCALC 125*  TRIG 85  CHOLHDL 3.7   Assessment/Plan 1. Essential alopecia of women -cont  minoxidil and flutamine--dermatologist follows labs related to this--pt to find out what we need to check  2. Osteopenia of multiple sites -ongoing, cont vitamin D and weightbearing until next bone density due in 04/2018  after the 17th  3. Irritable bowel syndrome with diarrhea - ongoing issues due to decreased strength of sphincter, rectal prolapse s/p enterocele repair, cont fiber and levsin daily--decrease fiber if increased grapes - CBC with Differential/Platelet; Future - Basic metabolic panel; Future  4. Rectal prolapse - f/u at [facility] about this once more b/c she does not think the pessary has solved her passing of urine issues and still has periods of severe flatus and fecal incontinence - CBC with Differential/Platelet; Future - Hepatic function panel; Future  5. Neck ache - with some mild torticollis, suspect OA from prior C5 injury -counseled on use of tylenol (already doing), heat, topicals; tramadol if severe pain - CBC with Differential/Platelet; Future - traMADol (ULTRAM) 50 MG tablet; Take 1 tablet (50 mg total) daily as needed by mouth (severe neck pain).  Dispense: 30 tablet; Refill: 0  6. Esophageal stricture -was due to bisphosphonates and acidity in diet, has hiatal hernia, remains on ppi due to this -for upcoming cscope and wants EGD, too, to see if stricture and inflammation have resolved--she's asymptomatic, but has been all along by her report -would like to stop ppi due to osteopenia  7. History of breast cancer in adulthood -noted, in remission, was followed by Dr. Jana Hakim (see hpi for details)  8. Essential hypertension -bp at goal with losartan therapy - Basic metabolic panel; Future  9. Pure hypercholesterolemia - lipids satisfactory given no CAD/CVD known - Lipid panel; Future - Hepatic function panel; Future  Labs/tests ordered:   Orders Placed This Encounter  Procedures  . CBC with Differential/Platelet    Standing Status:   Future    Standing Expiration Date:   04/12/2018  . Lipid panel    Standing Status:   Future    Standing Expiration Date:   04/12/2018    Order Specific Question:   Has the patient fasted?    Answer:   Yes  . Basic metabolic panel     Standing Status:   Future    Standing Expiration Date:   04/12/2018    Order Specific Question:   Has the patient fasted?    Answer:   Yes  . Hepatic function panel    Standing Status:   Future    Standing Expiration Date:   04/12/2018   Next appt:  6 mos for annual exam with labs before  [place]. Mouhamadou Gittleman, D.O. [facility] [address], [place] [address] Cell Phone (Mon-Fri 8am-5pm):  [phone] On Call:  [phone] & follow prompts after 5pm & weekends Office Phone:  [phone] Office Fax:  [phone]

## 2017-04-19 ENCOUNTER — Ambulatory Visit (INDEPENDENT_AMBULATORY_CARE_PROVIDER_SITE_OTHER): Payer: Medicare Other | Admitting: Internal Medicine

## 2017-04-19 ENCOUNTER — Encounter: Payer: Self-pay | Admitting: Internal Medicine

## 2017-04-19 VITALS — BP 122/70 | HR 62 | Temp 98.3°F | Wt 158.0 lb

## 2017-04-19 DIAGNOSIS — M25561 Pain in right knee: Secondary | ICD-10-CM

## 2017-04-19 NOTE — Progress Notes (Signed)
Location:  [facility] clinic Provider: Adalaide Jaskolski L. Mariea Clonts, D.O., C.M.D.  Code Status: DNR Goals of Care:  Advanced Directives 04/12/2017  Does Patient Have a Medical Advance Directive? Yes  Type of Advance Directive [place]  Does patient want to make changes to medical advance directive? No - Patient declined  Copy of [place] in Chart? -  Would patient like information on creating a medical advance directive? -   Chief Complaint  Patient presents with  . Acute Visit    right knee pain x1 week    HPI: Patient is a 76 y.o. female seen today for an acute visit for right knee pain for one week.   Packed her car wed to go to a conference and walking out to the car.  Doesn't remember twisting or turning it.  Tried to minimize walking at conference after walking 2 hrs.  Was better at first after resting to drive.  Has [place] trip over thanksgiving coming.  Used brace on right knee which helped pain, but left a bruise behind her knee.  Hurts right in the middle when walking.  Not swollen.    Past Medical History:  Diagnosis Date  . Abnormality of gait   . Alopecia, unspecified   . Altered mental status   . Breast cancer ([place])    stage 1; right  . Cataracts, bilateral   . Decreased rectal sphincter tone 07/01/12  . Diverticulosis   . Enterocele   . Esophageal stricture 07/01/12  . Hernia   . Hiatal hernia   . Hypertension   . IBS (irritable bowel syndrome)   . Irritable bowel syndrome   . Lumbago   . Migraine with aura, without mention of intractable migraine without mention of status migrainosus   . Mixed hearing loss, bilateral   . 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 Osteoarthrosis, unspecified whether generalized or localized, unspecified site   . Other and unspecified hyperlipidemia   . Plantar fascial fibromatosis   . Rectal prolapse   . Reflux esophagitis   . Regional enteritis of small intestine ([place])   . Senile osteoporosis    . Spasm of muscle   . Unspecified constipation   . Unspecified hypothyroidism     Past Surgical History:  Procedure Laterality Date  . APPENDECTOMY    . BASAL CELL CARCINOMA EXCISION  1978   neck  . BREAST LUMPECTOMY Right    snbx, apbi  . Cataract removal OD  06/28/2012   . Cataract removal OS  12/13/2012  . dermatolfibroma  2012  . DILATION AND CURETTAGE OF UTERUS    . ENTEROCELE REPAIR    . HAIR TRANSPLANT  2008  . HERNIA REPAIR    . OOPHORECTOMY    . strabisumus eye surgery     x 2  . TONSILLECTOMY      Allergies  Allergen Reactions  . Cetacaine [Butamben-Tetracaine-Benzocaine]     "loss of taste"    Outpatient Encounter Medications as of 04/19/2017  Medication Sig  . acetaminophen (TYLENOL) 500 MG tablet Take 1 tablets every 6-8 hours as needed for pain but no more than 6 tablets in a day  . aspirin 81 MG tablet Take 81 mg by mouth daily.  . Cholecalciferol (VITAMIN D-3) 1000 units CAPS Take by mouth. Take one Vitamin D-3 1,000 daily  . conjugated estrogens (PREMARIN) vaginal cream Apply half a gram (1/4 applicator) to opening of vagina twice weekly.  . diphenhydrAMINE (BENADRYL) 25 MG tablet  Take 25 mg by mouth. Take one at bedtime to help with sleep as needed  . flutamide (EULEXIN) 125 MG capsule TAKE 2 CAPSULES EVERY DAY  . hyoscyamine (LEVSIN SL) 0.125 MG SL tablet DISSOLVE 1 TABLET UNDER TONGUE EVERY 4-6 HOURS AS NEEDED FOR DIARRHEA  . losartan (COZAAR) 50 MG tablet TAKE 1 TABLET BY MOUTH EVERY DAY  . minoxidil (LONITEN) 2.5 MG tablet TAKE 1/2 TAB BY MOUTH DAILY  . Multiple Vitamins-Minerals (MULTIVITAMIN & MINERAL PO) Take 1 tablet by mouth daily.  . NON FORMULARY Fiber pill take 2 twice daily  . pantoprazole (PROTONIX) 40 MG tablet TAKE 1 TABLET (40 MG TOTAL) BY MOUTH EVERY OTHER DAY.  . traMADol (ULTRAM) 50 MG tablet Take 1 tablet (50 mg total) daily as needed by mouth (severe neck pain).  . vitamin C (ASCORBIC ACID) 500 MG tablet Take 500 mg by mouth  daily.  . Wheat Dextrin (BENEFIBER) POWD Take 1 each by mouth daily.   No facility-administered encounter medications on file as of 04/19/2017.     Review of Systems:  Review of Systems  Constitutional: Negative for chills and fever.  Respiratory: Negative for shortness of breath.   Cardiovascular: Negative for chest pain.  Musculoskeletal: Positive for joint pain. Negative for falls.  Neurological: Negative for tingling, sensory change and focal weakness.    Health Maintenance  Topic Date Due  . MAMMOGRAM  11/04/2016  . TETANUS/TDAP  12/06/2021  . INFLUENZA VACCINE  Completed  . DEXA SCAN  Completed  . PNA vac Low Risk Adult  Completed    Physical Exam: Vitals:   04/19/17 1600  BP: 122/70  Pulse: 62  Temp: 98.3 F (36.8 C)  TempSrc: Oral  SpO2: 97%  Weight: 158 lb (71.7 kg)   Body mass index is 27.12 kg/m. Physical Exam  Constitutional: She is oriented to person, place, and time. She appears well-developed and well-nourished. No distress.  Musculoskeletal:  Right knee warm, nontender; points to pain anterior knee when ambulating, no color change, no effusion  Neurological: She is alert and oriented to person, place, and time.  Skin: Skin is warm and dry. No rash noted. No erythema.    Labs reviewed: Basic Metabolic Panel: Recent Labs    09/28/16 1000  NA 140  K 4.0  CL 102  CO2 29  GLUCOSE 86  BUN 18  CREATININE 0.77  CALCIUM 9.5   Liver Function Tests: Recent Labs    09/28/16 1000  AST 22  ALT 18  ALKPHOS 89  BILITOT 0.5  PROT 7.1  ALBUMIN 4.3   No results for input(s): LIPASE, AMYLASE in the last 8760 hours. No results for input(s): AMMONIA in the last 8760 hours. CBC: No results for input(s): WBC, NEUTROABS, HGB, HCT, MCV, PLT in the last 8760 hours. Lipid Panel: Recent Labs    09/28/16 1000  CHOL 195  HDL 53  LDLCALC 125*  TRIG 85  CHOLHDL 3.7  \Assessment/Plan 1. Right anterior knee pain -seems like minor cartilage  injury/meniscal tear -RICE -call if worsens for imaging -tylenol oral and topicals  Labs/tests ordered:  No orders of the defined types were placed in this encounter.  Next appt:  10/07/2017  Undine Nealis L. Ormond Lazo, D.O. [facility] [address], [place] [address] Cell Phone (Mon-Fri 8am-5pm):  [phone] On Call:  [phone] & follow prompts after 5pm & weekends Office Phone:  [phone] Office Fax:  [phone]

## 2017-04-19 NOTE — Patient Instructions (Addendum)
Rest Ice 20 minutes 4 times per day  Compress with a brace of the right size Elevate   Let me know if your right knee pain is getting worse so we can image it.

## 2017-05-03 ENCOUNTER — Telehealth: Payer: Self-pay | Admitting: *Deleted

## 2017-05-03 ENCOUNTER — Ambulatory Visit
Admission: RE | Admit: 2017-05-03 | Discharge: 2017-05-03 | Disposition: A | Discharge status: Home / Self Care | Payer: Medicare Other | Source: Ambulatory Visit | Attending: Internal Medicine | Admitting: Internal Medicine

## 2017-05-03 DIAGNOSIS — M542 Cervicalgia: Secondary | ICD-10-CM

## 2017-05-03 NOTE — Telephone Encounter (Signed)
Pt was on a trip bird watching and still having neck pain and asking if imaging of her neck can be done? Please advise     5. Neck ache - with some mild torticollis, suspect OA from prior C5 injury -counseled on use of tylenol (already doing), heat, topicals; tramadol if severe pain - CBC with Differential/Platelet; Future - traMADol (ULTRAM) 50 MG tablet; Take 1 tablet (50 mg total) daily as needed by mouth (severe neck pain).  Dispense: 30 tablet; Refill: 0

## 2017-05-03 NOTE — Telephone Encounter (Signed)
Order placed for neck xrays.

## 2017-05-05 ENCOUNTER — Telehealth: Payer: Self-pay | Admitting: *Deleted

## 2017-05-05 NOTE — Telephone Encounter (Signed)
Patient called and stated that she spoke with Coralyn Mark yesterday regarding her Neck X-Ray results. Patient stated that she wants the referral for the MRI done and wants a pain medication to relieve the pain. Also is wondering if she should be referred to a Neurologist. The Neck pain is affecting her daily routine.   Appointment Scheduled with Dr. Mariea Clonts for 05/06/2017 @1 :00. Patient request. Patient stated that since there has been a big change from the last time she was seen she would be more comfortable seeing Dr. Mariea Clonts. Patient also wants to know if there is something she could be doing between now and the appointment. Please Advise.

## 2017-05-05 NOTE — Telephone Encounter (Signed)
Patient notified and agreed.  

## 2017-05-05 NOTE — Telephone Encounter (Signed)
Ok, great.  I'll address all of this at her appt tomorrow.  Neurology will not be helpful for this.

## 2017-05-06 ENCOUNTER — Ambulatory Visit: Payer: Medicare Other | Admitting: Internal Medicine

## 2017-05-06 ENCOUNTER — Encounter: Payer: Self-pay | Admitting: Internal Medicine

## 2017-05-06 VITALS — BP 138/70 | HR 73 | Temp 97.4°F | Wt 157.0 lb

## 2017-05-06 DIAGNOSIS — M5030 Other cervical disc degeneration, unspecified cervical region: Secondary | ICD-10-CM | POA: Insufficient documentation

## 2017-05-06 DIAGNOSIS — M436 Torticollis: Secondary | ICD-10-CM

## 2017-05-06 DIAGNOSIS — M40292 Other kyphosis, cervical region: Secondary | ICD-10-CM | POA: Diagnosis not present

## 2017-05-06 DIAGNOSIS — M40209 Unspecified kyphosis, site unspecified: Secondary | ICD-10-CM | POA: Insufficient documentation

## 2017-05-06 MED ORDER — METHOCARBAMOL 500 MG PO TABS: 500.0000 mg | ORAL_TABLET | Freq: Every evening | ORAL | 0 refills | Status: DC | PRN

## 2017-05-06 NOTE — Progress Notes (Signed)
Location:  [facility] clinic Provider: Marymargaret Kirker L. Mariea Clonts, D.O., C.M.D.  Code Status: DNR Goals of Care:  Advanced Directives 04/12/2017  Does Patient Have a Medical Advance Directive? Yes  Type of Advance Directive [place]  Does patient want to make changes to medical advance directive? No - Patient declined  Copy of [place] in Chart? -  Would patient like information on creating a medical advance directive? -   Chief Complaint  Patient presents with  . Acute Visit    discuss neck pain    HPI: Patient is a 76 y.o. female seen today for an acute visit to discuss neck pain.    Neck pain has gotten worse--ROM is far worse.  Cannot back up well driving and feels like she has to hold her chin up physicaly when standing.  [place].  Dull pain is radiating down arms now.  When she was unloading the car, she pushed the rear lid and she walked into it b/c it had moved down.  She fell backwards after running straight into it. It is hurting to brush her teeth or eat.  She is spilling food down the front over her.   Xrays reviewed with her showed significant cervical kyphosis, DDD, anterior osteophytes and some impingement of the her neuroforaminae.   Past Medical History:  Diagnosis Date  . Abnormality of gait   . Alopecia, unspecified   . Altered mental status   . Breast cancer ([place])    stage 1; right  . Cataracts, bilateral   . Decreased rectal sphincter tone 07/01/12  . Diverticulosis   . Enterocele   . Esophageal stricture 07/01/12  . Hernia   . Hiatal hernia   . Hypertension   . IBS (irritable bowel syndrome)   . Irritable bowel syndrome   . Lumbago   . Migraine with aura, without mention of intractable migraine without mention of status migrainosus   . Mixed hearing loss, bilateral   . 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 Osteoarthrosis, unspecified whether generalized or localized,  unspecified site   . Other and unspecified hyperlipidemia   . Plantar fascial fibromatosis   . Rectal prolapse   . Reflux esophagitis   . Regional enteritis of small intestine ([place])   . Senile osteoporosis   . Spasm of muscle   . Unspecified constipation   . Unspecified hypothyroidism     Past Surgical History:  Procedure Laterality Date  . APPENDECTOMY    . BASAL CELL CARCINOMA EXCISION  1978   neck  . BREAST LUMPECTOMY Right    snbx, apbi  . Cataract removal OD  06/28/2012   . Cataract removal OS  12/13/2012  . dermatolfibroma  2012  . DILATION AND CURETTAGE OF UTERUS    . ENTEROCELE REPAIR    . HAIR TRANSPLANT  2008  . HERNIA REPAIR    . OOPHORECTOMY    . strabisumus eye surgery     x 2  . TONSILLECTOMY      Allergies  Allergen Reactions  . Cetacaine [Butamben-Tetracaine-Benzocaine]     "loss of taste"    Outpatient Encounter Medications as of 05/06/2017  Medication Sig  . acetaminophen (TYLENOL) 500 MG tablet Take 1 tablets every 6-8 hours as needed for pain but no more than 6 tablets in a day  . aspirin 81 MG tablet Take 81 mg by mouth daily.  . Cholecalciferol (VITAMIN D-3) 1000 units CAPS Take  by mouth. Take one Vitamin D-3 1,000 daily  . conjugated estrogens (PREMARIN) vaginal cream Apply half a gram (1/4 applicator) to opening of vagina twice weekly.  . diphenhydrAMINE (BENADRYL) 25 MG tablet Take 25 mg by mouth. Take one at bedtime to help with sleep as needed  . flutamide (EULEXIN) 125 MG capsule TAKE 2 CAPSULES EVERY DAY  . hyoscyamine (LEVSIN SL) 0.125 MG SL tablet DISSOLVE 1 TABLET UNDER TONGUE EVERY 4-6 HOURS AS NEEDED FOR DIARRHEA  . losartan (COZAAR) 50 MG tablet TAKE 1 TABLET BY MOUTH EVERY DAY  . minoxidil (LONITEN) 2.5 MG tablet TAKE 1/2 TAB BY MOUTH DAILY  . Multiple Vitamins-Minerals (MULTIVITAMIN & MINERAL PO) Take 1 tablet by mouth daily.  . NON FORMULARY Fiber pill take 2 twice daily  . pantoprazole (PROTONIX) 40 MG tablet TAKE 1 TABLET (40  MG TOTAL) BY MOUTH EVERY OTHER DAY.  . traMADol (ULTRAM) 50 MG tablet Take 1 tablet (50 mg total) daily as needed by mouth (severe neck pain).  . vitamin C (ASCORBIC ACID) 500 MG tablet Take 500 mg by mouth daily.  . Wheat Dextrin (BENEFIBER) POWD Take 1 each by mouth daily.   No facility-administered encounter medications on file as of 05/06/2017.     Review of Systems:  ROS  Health Maintenance  Topic Date Due  . MAMMOGRAM  11/04/2016  . TETANUS/TDAP  12/06/2021  . INFLUENZA VACCINE  Completed  . DEXA SCAN  Completed  . PNA vac Low Risk Adult  Completed    Physical Exam: Vitals:   05/06/17 1316  Weight: 157 lb (71.2 kg)   Body mass index is 26.95 kg/m. Physical Exam  Labs reviewed: Basic Metabolic Panel: Recent Labs    09/28/16 1000  NA 140  K 4.0  CL 102  CO2 29  GLUCOSE 86  BUN 18  CREATININE 0.77  CALCIUM 9.5   Liver Function Tests: Recent Labs    09/28/16 1000  AST 22  ALT 18  ALKPHOS 89  BILITOT 0.5  PROT 7.1  ALBUMIN 4.3   No results for input(s): LIPASE, AMYLASE in the last 8760 hours. No results for input(s): AMMONIA in the last 8760 hours. CBC: No results for input(s): WBC, NEUTROABS, HGB, HCT, MCV, PLT in the last 8760 hours. Lipid Panel: Recent Labs    09/28/16 1000  CHOL 195  HDL 53  LDLCALC 125*  TRIG 85  CHOLHDL 3.7   No results found for: HGBA1C  Procedures since last visit: Dg Cervical Spine Complete  Result Date: 05/04/2017 CLINICAL DATA:  76 year old female with a history of neck pain EXAM: CERVICAL SPINE - COMPLETE 4+ VIEW COMPARISON:  None. FINDINGS: Cervical Spine: Cervical elements from the level of the C1-T1 maintain relative alignment, with no subluxation. Accentuated cervical kyphosis. No acute fracture line identified. Unremarkable appearance of the craniocervical junction. Vertebral body heights relatively maintained. No acute fracture line identified. Degenerative disc disease throughout the cervical spine with  disc space narrowing, endplate sclerosis, anterior osteophyte production, and uncovertebral joint disease. Left oblique images demonstrate evidence of foraminal encroachment at the C2-C3 level secondary to facet disease as well as the C5-C6 level secondary to uncovertebral joint disease. Right oblique images demonstrate evidence of foraminal encroachment at the C3-C4 and C4-C5 levels secondary to uncovertebral joint disease. Open mouth odontoid view unremarkable. IMPRESSION: No acute fracture malalignment of the cervical spine. Degenerative disc disease contributes to pronounced cervical kyphosis. Apparent foraminal encroachment secondary to uncovertebral joint disease and facet disease at left C2-C3 and  C5-C6, and right C3-C4 and C4-C5. Electronically Signed   By: Corrie Mckusick D.O.   On: 05/04/2017 08:03    Assessment/Plan 1. Acute torticollis - 20 mins of heat on your neck 4 times a day Aleve twice a day for a week with food Salonpas topically over the neck is also ok. I recommend physical therapy to help with the torticollis.   Use the robaxin (methocarbamol) only at night as needed to relax the muscles. - methocarbamol (ROBAXIN) 500 MG tablet; Take 1 tablet (500 mg total) by mouth at bedtime as needed for muscle spasms.  Dispense: 30 tablet; Refill: 0  2. DDD (degenerative disc disease), cervical -baseline noted on xrays  3. Other kyphosis of cervical region -worsened with torticollis now  Labs/tests ordered:  PT (pt knows where she wants to go) Next appt:  10/07/2017  Saphyra Hutt L. Torrie Namba, D.O. [facility] [address], [place] [address] Cell Phone (Mon-Fri 8am-5pm):  [phone] On Call:  [phone] & follow prompts after 5pm & weekends Office Phone:  [phone] Office Fax:  [phone]

## 2017-05-06 NOTE — Patient Instructions (Addendum)
20 mins of heat on your neck 4 times a day Aleve twice a day for a week with food Salonpas topically over the neck is also ok. I recommend physical therapy to help with the torticollis.   Use the robaxin (methocarbamol) only at night as needed to relax the muscles.

## 2017-05-07 ENCOUNTER — Telehealth: Payer: Self-pay

## 2017-05-07 NOTE — Telephone Encounter (Signed)
Spoke with patient regarding Dr. Magdalene Molly response she expressed understanding.

## 2017-05-07 NOTE — Telephone Encounter (Signed)
It's ok to use the patches and the aleve for a week.  The patches only have about a 10% absorption into the general system that could cause bp elevation.  There is more risk of the bleeding and high bp from [place].

## 2017-05-07 NOTE — Telephone Encounter (Signed)
Patient wanted to know if it was safe for her to use the patches and aleve at the same time for a week. She is taking the aleve 2x a day with food.  She is also concerend about the patches because on the box it states to contact provider if you have high BP, because there is a risk of a stomach bleed.  Please advise.

## 2017-05-10 ENCOUNTER — Telehealth: Payer: Self-pay | Admitting: *Deleted

## 2017-05-10 NOTE — Telephone Encounter (Signed)
Patient notified and agreed. Patient has 3 PT appointments set up for this week and more to come.

## 2017-05-10 NOTE — Telephone Encounter (Signed)
Patient called and stated that she was prescribed Methocarbamol for Muscle Spasms. Patient stated that she is not having spasms, just pain. Stated that the only thing she is taking for pain, for neck and Knee, is Aleve twice daily and occasional Tylenol or Tramadol. Patient wants to know if you can prescribe her something for her pain. States that it is very uncomfortable. Please Advise.

## 2017-05-10 NOTE — Telephone Encounter (Signed)
The methocarbamol may help the muscle pain.  She should try it tonight.  Her muscles are very tight in her neck.  Has she called for PT?

## 2017-05-13 ENCOUNTER — Telehealth: Payer: Self-pay | Admitting: *Deleted

## 2017-05-13 DIAGNOSIS — M25561 Pain in right knee: Secondary | ICD-10-CM

## 2017-05-13 DIAGNOSIS — R29898 Other symptoms and signs involving the musculoskeletal system: Secondary | ICD-10-CM

## 2017-05-13 NOTE — Telephone Encounter (Signed)
Order placed for xrays of right knee at [facility].  She may walk-in to get these tomorrow.  Rest Ice Elevate and Compression on the knee with an ace wrap.    Lusero Nordlund L. Ilisha Blust, D.O. [facility] [address], [place] [address] Cell Phone (Mon-Fri 8am-5pm):  [phone] On Call:  [phone] & follow prompts after 5pm & weekends Office Phone:  [phone] Office Fax:  [phone]

## 2017-05-13 NOTE — Telephone Encounter (Signed)
Patient called and stated that her Right knee is much worse. Stated that she is using a cane. Stated that she had some close calls with falling. Using heat on the knee several times a day. Can't tell if its swollen. Wearing a brace on it. Right Knee does not like any twisting or turns at all. Not taking much for pain. Doing the Tylenol. Not much resolution to the pain.  Is doing PT. Hard to say if the Neck Pain is any better also. Stated that the Knee is hindering her exercises.  Patient is wanting X-Rays done on her knee. Please Advise.

## 2017-05-14 NOTE — Telephone Encounter (Signed)
Spoke with patient, patient states her physical therapist worked on her knee yesterday and today it feels much better.   Patient will consult with PT to see if they agree with Xray order, patient will try to get xray this am in hopes that the results will be in before closing

## 2017-05-14 NOTE — Telephone Encounter (Signed)
Ok to continue the [employer] with food.  We will check her kidney labs when she comes in next time.

## 2017-05-14 NOTE — Telephone Encounter (Signed)
Patient called back and stated that she was told to take Aleve for a week twice daily with meal. Patient stated that she started this on 11/30 and still taking it so it has been well over a week. Patient is wondering if she can still take it due to still have Neck and Knee pain. The pain is a little better but still has some pain. Please Advise.

## 2017-05-14 NOTE — Telephone Encounter (Signed)
.  left message to have patient return my call.  

## 2017-05-14 NOTE — Telephone Encounter (Signed)
No answer, phone just rang will try again later

## 2017-05-15 ENCOUNTER — Encounter (HOSPITAL_COMMUNITY): Payer: Self-pay

## 2017-05-15 ENCOUNTER — Other Ambulatory Visit: Payer: Self-pay

## 2017-05-15 ENCOUNTER — Emergency Department (HOSPITAL_COMMUNITY): Payer: Medicare Other

## 2017-05-15 ENCOUNTER — Emergency Department (HOSPITAL_COMMUNITY)
Admission: EM | Admit: 2017-05-15 | Discharge: 2017-05-15 | Disposition: A | Discharge status: Home / Self Care | Payer: Medicare Other | Attending: Emergency Medicine | Admitting: Emergency Medicine

## 2017-05-15 DIAGNOSIS — E039 Hypothyroidism, unspecified: Secondary | ICD-10-CM | POA: Insufficient documentation

## 2017-05-15 DIAGNOSIS — Z7982 Long term (current) use of aspirin: Secondary | ICD-10-CM | POA: Insufficient documentation

## 2017-05-15 DIAGNOSIS — Z79899 Other long term (current) drug therapy: Secondary | ICD-10-CM | POA: Diagnosis not present

## 2017-05-15 DIAGNOSIS — Z85828 Personal history of other malignant neoplasm of skin: Secondary | ICD-10-CM | POA: Diagnosis not present

## 2017-05-15 DIAGNOSIS — I10 Essential (primary) hypertension: Secondary | ICD-10-CM | POA: Diagnosis not present

## 2017-05-15 DIAGNOSIS — M542 Cervicalgia: Secondary | ICD-10-CM | POA: Diagnosis present

## 2017-05-15 DIAGNOSIS — Z853 Personal history of malignant neoplasm of breast: Secondary | ICD-10-CM | POA: Insufficient documentation

## 2017-05-15 DIAGNOSIS — R29898 Other symptoms and signs involving the musculoskeletal system: Secondary | ICD-10-CM | POA: Diagnosis not present

## 2017-05-15 DIAGNOSIS — R221 Localized swelling, mass and lump, neck: Secondary | ICD-10-CM | POA: Insufficient documentation

## 2017-05-15 LAB — CBC WITH DIFFERENTIAL/PLATELET
Basophils Absolute: 0 10*3/uL (ref 0.0–0.1)
Basophils Relative: 1 %
EOS ABS: 0.1 10*3/uL (ref 0.0–0.7)
EOS PCT: 1 %
HCT: 39.7 % (ref 36.0–46.0)
Hemoglobin: 13.2 g/dL (ref 12.0–15.0)
LYMPHS ABS: 1.8 10*3/uL (ref 0.7–4.0)
Lymphocytes Relative: 22 %
MCH: 31.1 pg (ref 26.0–34.0)
MCHC: 33.2 g/dL (ref 30.0–36.0)
MCV: 93.6 fL (ref 78.0–100.0)
MONO ABS: 0.6 10*3/uL (ref 0.1–1.0)
MONOS PCT: 7 %
Neutro Abs: 5.5 10*3/uL (ref 1.7–7.7)
Neutrophils Relative %: 69 %
PLATELETS: 382 10*3/uL (ref 150–400)
RBC: 4.24 MIL/uL (ref 3.87–5.11)
RDW: 12.9 % (ref 11.5–15.5)
WBC: 7.9 10*3/uL (ref 4.0–10.5)

## 2017-05-15 LAB — BASIC METABOLIC PANEL
Anion gap: 8 (ref 5–15)
BUN: 18 mg/dL (ref 6–20)
CALCIUM: 9.4 mg/dL (ref 8.9–10.3)
CHLORIDE: 104 mmol/L (ref 101–111)
CO2: 24 mmol/L (ref 22–32)
CREATININE: 0.77 mg/dL (ref 0.44–1.00)
GFR calc Af Amer: >60 mL/min (ref >60)
Glucose, Bld: 102 mg/dL — ABNORMAL HIGH (ref 65–99)
Potassium: 4 mmol/L (ref 3.5–5.1)
SODIUM: 136 mmol/L (ref 135–145)

## 2017-05-15 NOTE — Discharge Instructions (Signed)
Please follow-up with neurology at the next available appointment.  Please also schedule an appointment with your PCP for reevaluation.  Please return to the emergency department if you have numbness, worsening or new weakness, shortness of breath or confusion.

## 2017-05-15 NOTE — ED Provider Notes (Signed)
Marilyn [name] Provider Note   CSN: [id number] Arrival date & time: 05/15/17  1616     History   Chief Complaint Chief Complaint  Patient presents with  . Torticollis    HPI Marilyn [name] is a 76 y.o. female.  HPI 76 year old female presenting with a 2-1/2 weeks of worsening neck pain, mild occipital headache and inability to raise her head.  She states that the day before Thanksgiving she was walking and hit her head on something in front of her hard enough to make her fall onto her back.  She is unaware if she hit her head on the ground at that time and she is unaware of if she hyperextended or hyperflexed when she hit her head.  She states that she has had progressively worsening pain symptoms since that time.  She saw her PCP and had cervical x-rays that only showed arthritis.  She has been seeing a physical therapist who has been trying traction and other exercises to increase her neck mobility with no success.  She states her symptoms continue to worsen.  She denies any recent fevers, chest pain, abdominal pain, shortness of breath.  No difficulty with gait.  Denies vision or speech changes.  No alleviating or exacerbating factors.  She does endorse intermittent "tightness" across both of her shoulders.  Past Medical History:  Diagnosis Date  . Abnormality of gait   . Alopecia, unspecified   . Altered mental status   . Breast cancer ([place])    stage 1; right  . Cataracts, bilateral   . Decreased rectal sphincter tone 07/01/12  . Diverticulosis   . Enterocele   . Esophageal stricture 07/01/12  . Hernia   . Hiatal hernia   . Hypertension   . IBS (irritable bowel syndrome)   . Irritable bowel syndrome   . Lumbago   . Migraine with aura, without mention of intractable migraine without mention of status migrainosus   . Mixed hearing loss, bilateral   . 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 Osteoarthrosis, unspecified whether  generalized or localized, unspecified site   . Other and unspecified hyperlipidemia   . Plantar fascial fibromatosis   . Rectal prolapse   . Reflux esophagitis   . Regional enteritis of small intestine ([place])   . Senile osteoporosis   . Spasm of muscle   . Unspecified constipation   . Unspecified hypothyroidism     Patient Active Problem List   Diagnosis Date Noted  . DDD (degenerative disc disease), cervical 05/06/2017  . Kyphosis 05/06/2017  . History of breast cancer in adulthood 04/12/2017  . Urine incontinence 10/07/2016  . Edema 11/06/2015  . Pain in thumb joint with movement 03/05/2015  . Essential alopecia of women 10/15/2014  . Osteopenia 03/13/2014  . Elevated anti-tissue transglutaminase (tTG) IgA level 02/28/2014  . Fecal incontinence 10/17/2013  . Hearing decreased 10/17/2013  . Lumbago 10/17/2013  . Breast cancer of upper-outer quadrant of right female breast ([place]) 03/08/2013  . Reflux esophagitis   . Hyperlipemia   . Esophageal stricture   . Decreased rectal sphincter tone 07/01/2012  . Irritant contact hand eczema 08/31/2011  . Choroidal nevus of right eye 06/10/2011  . Essential hypertension 10/29/2007  . INGUINAL HERNIA 10/29/2007  . DIVERTICULOSIS, COLON 10/29/2007  . IBS 10/29/2007  . Rectal prolapse 10/29/2007  . HEADACHE, CHRONIC 10/29/2007    Past Surgical History:  Procedure Laterality Date  . APPENDECTOMY    . BASAL CELL CARCINOMA EXCISION  1978   neck  . BREAST LUMPECTOMY Right    snbx, apbi  . Cataract removal OD  06/28/2012   . Cataract removal OS  12/13/2012  . dermatolfibroma  2012  . DILATION AND CURETTAGE OF UTERUS    . ENTEROCELE REPAIR    . HAIR TRANSPLANT  2008  . HERNIA REPAIR    . OOPHORECTOMY    . strabisumus eye surgery     x 2  . TONSILLECTOMY      OB History    No data available       Home Medications    Prior to Admission medications   Medication Sig Start Date End Date Taking? Authorizing Provider    acetaminophen (TYLENOL) 500 MG tablet Take 500 mg by mouth See admin instructions. 500 mg EVERY 4-6 HOURS AS NEEDED FOR PAIN WITH A MAX TOTAL DAILY DOSE OF 3,000 MG 11/22/12  Yes Lauree Chandler, NP  aspirin 81 MG tablet Take 81 mg by mouth daily.   Yes [provider]  CALCIUM PO Take 1-2 tablets by mouth See admin instructions. 1 tablet once a day rotating with 2 tablets every other day   Yes [provider]  Cholecalciferol (VITAMIN D-3) 1000 units CAPS Take 1,000 Units by mouth daily.    Yes [provider]  conjugated estrogens (PREMARIN) vaginal cream Apply 0.5 g three times a month 10/20/16  Yes [provider]  diphenhydrAMINE (BENADRYL) 25 MG tablet Take 12.5-25 mg by mouth at bedtime as needed for sleep.    Yes [provider]  flutamide (EULEXIN) 125 MG capsule TAKE 2 CAPSULES EVERY DAY Patient taking differently: Take 125 mg by mouth two times a day 05/29/13  Yes Estill Dooms, MD  hyoscyamine (LEVSIN SL) 0.125 MG SL tablet DISSOLVE 1 TABLET UNDER TONGUE EVERY 4-6 HOURS AS NEEDED FOR DIARRHEA Patient taking differently: DISSOLVE 1 TABLET UNDER TONGUE EVERY 4-6 HOURS AS NEEDED FOR GAS 09/09/16  Yes Irene Shipper, MD  losartan (COZAAR) 50 MG tablet TAKE 1 TABLET BY MOUTH EVERY DAY Patient taking differently: Take 50 mg by mouth once a day 03/30/17  Yes Reed, Tiffany L, DO  methocarbamol (ROBAXIN) 500 MG tablet Take 1 tablet (500 mg total) by mouth at bedtime as needed for muscle spasms. 05/06/17  Yes Reed, Tiffany L, DO  minoxidil (LONITEN) 2.5 MG tablet Take 1.25 (0.5 tablet) once a day 03/08/17  Yes [provider]  Multiple Vitamins-Minerals (MULTIVITAMIN & MINERAL PO) Take 1 tablet by mouth daily.   Yes [provider]  naproxen sodium (ALEVE) 220 MG tablet Take 220 mg by mouth 2 (two) times daily as needed (for pain).   Yes [provider]  pantoprazole (PROTONIX) 40 MG tablet TAKE 1 TABLET (40 MG TOTAL) BY MOUTH  EVERY OTHER DAY. 01/29/17  Yes Irene Shipper, MD  traMADol (ULTRAM) 50 MG tablet Take 1 tablet (50 mg total) daily as needed by mouth (severe neck pain). Patient taking differently: Take 50 mg by mouth daily as needed (for severe neck pain).  04/12/17  Yes Reed, Tiffany L, DO  vitamin C (ASCORBIC ACID) 500 MG tablet Take 500 mg by mouth daily.   Yes [provider]  Wheat Dextrin (BENEFIBER) POWD Mix 2 tablespoonsful into milk/cereal once a day   Yes [provider]  Wheat Dextrin-Calcium ([employer] EASY FIBER/CALCIUM PO) Take 2 tablets by mouth 2 (two) times daily.    [provider]    Family History Family History  Problem Relation Age of Onset  . Hypertension Mother   . Stroke Mother   . Alzheimer's disease Father   . Hypertension Brother   . Breast cancer Maternal Aunt   . Gallbladder disease Brother   . Colon cancer Neg Hx     Social History Social History   Tobacco Use  . Smoking status: Never Smoker  . Smokeless tobacco: Never Used  Substance Use Topics  . Alcohol use: No  . Drug use: No     Allergies   Gabapentin and Cetacaine [butamben-tetracaine-benzocaine]   Review of Systems Review of Systems  Constitutional: Negative for chills and fever.  HENT: Negative for ear pain and sore throat.   Eyes: Negative for pain and visual disturbance.  Respiratory: Negative for cough and shortness of breath.   Cardiovascular: Negative for chest pain and palpitations.  Gastrointestinal: Negative for abdominal pain and vomiting.  Genitourinary: Negative for dysuria and hematuria.  Musculoskeletal: Positive for neck pain. Negative for arthralgias and back pain.  Skin: Negative for color change and rash.  Neurological: Positive for weakness and headaches. Negative for dizziness, seizures, syncope and light-headedness.  All other systems reviewed and are negative.    Physical Exam Updated Vital Signs BP 136/87   Pulse 73   Temp 98.1 F (36.7 C) (Oral)    Resp 18   Ht 5\' 4"  (1.626 m)   Wt 71.2 kg (157 lb)   SpO2 97%   BMI 26.95 kg/m   Physical Exam  Constitutional: She is oriented to person, place, and time. She appears well-developed and well-nourished. No distress.  HENT:  Head: Normocephalic and atraumatic.  Eyes: Conjunctivae are normal.  Neck: Neck supple. Spinous process tenderness (mild TTP in mid cervical spine) present. No neck rigidity. Decreased range of motion (inabilty to raise her head above neutral position.) present.  Cardiovascular: Normal rate, regular rhythm, normal heart sounds and normal pulses.  No murmur heard. Pulmonary/Chest: Effort normal and breath sounds normal. No tachypnea. No respiratory distress. She has no decreased breath sounds.  Abdominal: Soft. There is no tenderness.  Musculoskeletal: She exhibits no edema.  Neurological: She is alert and oriented to person, place, and time. GCS eye subscore is 4. GCS verbal subscore is 5. GCS motor subscore is 6.  Skin: Skin is warm and dry.  Psychiatric: She has a normal mood and affect.  Nursing note and vitals reviewed.    ED Treatments / Results  Labs (all labs ordered are listed, but only abnormal results are displayed) Labs Reviewed  BASIC METABOLIC PANEL - Abnormal; Notable for the following components:      Result Value   Glucose, Bld 102 (*)    All other components within normal limits  CBC WITH DIFFERENTIAL/PLATELET    EKG  EKG Interpretation None       Radiology Mr Brain Wo Contrast  Result Date: 05/15/2017 CLINICAL DATA:  76 y/o F; gradually worsening weakness in the neck and pain in a cape-like distribution over the shoulders. EXAM: MRI HEAD WITHOUT CONTRAST TECHNIQUE: Multiplanar, multiecho pulse sequences of the brain and surrounding structures were obtained without intravenous contrast. COMPARISON:  Concurrent MRI of the cervical spine. FINDINGS: Brain: No acute infarction, hemorrhage, hydrocephalus, extra-axial collection or mass  lesion. Scattered nonspecific foci of T2 FLAIR hyperintense signal abnormality in subcortical and periventricular white matter are compatible with mild chronic microvascular ischemic changes for age. Mild brain parenchymal volume loss. Two punctate foci of susceptibility hypointensity are present in right posterior temporal lobe likely  representing hemosiderin deposition of chronic microhemorrhage. Vascular: Normal flow voids. Skull and upper cervical spine: Normal marrow signal. Sinuses/Orbits: Small left maxillary sinus mucous retention cyst. Bilateral intra-ocular lens replacement. Otherwise negative. Other: None. IMPRESSION: 1. No acute intracranial abnormality identified. 2. Mild for age chronic microvascular ischemic changes and mild parenchymal volume loss of the brain. Electronically Signed   By: Kristine Garbe M.D.   On: 05/15/2017 22:43   Mr Cervical Spine Wo Contrast  Result Date: 05/15/2017 CLINICAL DATA:  76 y/o F; gradually worsening weakness in neck with cape-like distribution of pain over the shoulders. EXAM: MRI CERVICAL SPINE WITHOUT CONTRAST TECHNIQUE: Multiplanar, multisequence MR imaging of the cervical spine was performed. No intravenous contrast was administered. COMPARISON:  Concurrent MRI of the head. FINDINGS: Alignment: Reversal of cervical curvature.  No listhesis. Vertebrae: Mild endplate edema is present at the C4-C6 levels without intervening disc edema, likely degenerative. No evidence for fracture or discitis. Mild heterogeneity of bone marrow signal on T1 weighted sequence likely represents a combination mixed Modic endplate degenerative changes and focal fat. Cord: No abnormal cord signal. Posterior Fossa, vertebral arteries, paraspinal tissues: Negative. Disc levels:  Extensive motion degradation of axial sequences. C2-3: No significant disc displacement, foraminal stenosis, or canal stenosis. Bilateral facet hypertrophy. C3-4: Disc osteophyte complex with right  greater than left uncovertebral and facet hypertrophy. Moderate right foraminal stenosis. Ventral thecal sac effacement and disc contact of anterior cord and mild cord flattening. No canal stenosis. C4-5: Disc osteophyte complex with bilateral uncovertebral and facet hypertrophy. Mild bilateral foraminal stenosis. Ventral thecal sac effacement with disc contact of anterior cord and mild cord flattening. No canal stenosis. C5-6: Disc osteophyte complex with left-greater-than-right uncovertebral and facet hypertrophy. Mild left foraminal stenosis. Ventral thecal sac effacement with disc contact with the anterior cord and mild cord flattening. No canal stenosis. C6-7: Disc osteophyte complex with ventral thecal sac effacement and disc contact with the anterior cord and mild cord flattening. No significant foraminal or canal stenosis. C7-T1: Disc osteophyte complex with bilateral uncovertebral and facet hypertrophy greater on the left. Mild left foraminal stenosis. No significant canal stenosis. IMPRESSION: Motion degradation of axial sequences. 1. No acute osseous abnormality or abnormal cord signal. 2. Reversal of cervical curvature and moderate cervical spine spondylosis. 3. Prominent disc osteophyte complexes at the C3-4 through C6-7 levels efface the ventral thecal sac with anterior cord contact and mild anterior cord flattening. No significant canal stenosis. No cord compression. 4. Multilevel mild foraminal stenosis. Moderate right C3-4 foraminal stenosis. Electronically Signed   By: Kristine Garbe M.D.   On: 05/15/2017 22:38    Procedures Procedures (including critical care time)  Medications Ordered in ED Medications - No data to display   Initial Impression / Assessment and Plan / ED Course  I have reviewed the triage vital signs and the nursing notes.  Pertinent labs & imaging results that were available during my care of the patient were reviewed by me and considered in my edical  decision making (see chart for details).     76 year old female presenting with weakness of her neck and neck pain.  She states this has been progressively worsening over the past 2-1/2 weeks after hitting her head.  She is afebrile and hemodynamically stable.  Exam is as above and noted for inability to extend her neck past neutral position while standing.  No numbness or weakness elsewhere noted on exam.  Patient is afebrile and symptoms have been slowly progressive therefore doubt meningitis or encephalitis.  She had a recent traumatic injury and complains of a tightness sensation across her shoulders concern for central cord central vs other spinal abnormality. Will obtain MR C spine and brain. Basic labs ordered.  Labs grossly unremarkable. MR with no acute fracture or abnormal cord signal and no acute abnormality of the brain.  Although there is no acute abnormality noted, concern for progressive neurologic disease.  I do not believe that she requires further emergent workup as she is afebrile and exam is unchanged with a normal MRI.  We will have the patient follow up with neurology and PCP.  Strict return precautions given.  Patient is amenable with this plan.  Final Clinical Impressions(s) / ED Diagnoses   Final diagnoses:  Dropped head syndrome    ED Discharge Orders    None       Zadaya Cuadra Mali, MD 05/15/17 2307    Forde Dandy, MD 05/15/17 [phone]

## 2017-05-15 NOTE — ED Provider Notes (Signed)
I saw and evaluated the patient, reviewed the resident's note and I agree with the findings and plan.   EKG Interpretation None      76 year old female who presents with neck weakness.  She has a history of hypertension, hyperlipidemia, degenerative disc disease.  Reports gradually worsening weakness in her neck since just before Thanksgiving.  Symptoms began to occur occur after she had a fall.  States she had ran into a wall or door and fell backwards.  She has noted achiness in a cape-like distribution over her shoulders, not resolved with ibuprofen.  However gradually she has not been able to lift up her neck.  Has had outpatient x-rays of her neck that were unremarkable.  Has also been seen by physical therapist.  Denies any numbness or weakness in the arms or legs, urinary retention, bowel or urinary incontinence, vision or speech changes, or difficulty swallowing.  Patient well appearing, with stable vitals. Neuro exam is in tact, aside from patient's inability to lift her neck upwards. Question potential cervical myelopathy from her fall. MRI brain and cervical spine performed, visualized, and shows no acute cervical spine or spinal cord process. Unclear etiology of her symptoms, but differential could also include other neurological, rheumatologic, neuromuscular etiologies. I do feel she can undergo outpatient work-up at this time. Referral to neurology provided. She will also follow-up closely with PCP. Strict return and follow-up instructions reviewed. She expressed understanding of all discharge instructions and felt comfortable with the plan of care.    Forde Dandy, MD 05/15/17 [phone]

## 2017-05-15 NOTE — ED Notes (Signed)
Pt understood dc material. NAD noted. A philly collar was given for comfort.

## 2017-05-15 NOTE — ED Notes (Signed)
Patient transported to MRI 

## 2017-05-15 NOTE — ED Triage Notes (Signed)
Pt states that she is coming in due to having a stiff neck; "driving is becoming more challenging daily." VSS; Pt c/o pain and swelling in neck and also has headache that is unrelieved by OTC meds (Tylenol/Aleve). VSS; A/Ox4.

## 2017-05-18 NOTE — Telephone Encounter (Signed)
Patient called back wanting an appt asap, I advised that the referral was put in this morning at 11:22am and pt got upset that she didn't get a call back letting her know. Pt states she needs something done asap

## 2017-05-18 NOTE — Addendum Note (Signed)
Addended by: Hollace Kinnier L on: 05/18/2017 11:22 AM   Modules accepted: Orders

## 2017-05-18 NOTE — Telephone Encounter (Signed)
Referral entered as urgent due to progressive symptoms not resolved as anticipated with conservative measures.   Catlyn Shipton L. Clarene Curran, D.O. [facility] [address], [place] [address] Cell Phone (Mon-Fri 8am-5pm):  [phone] On Call:  [phone] & follow prompts after 5pm & weekends Office Phone:  [phone] Office Fax:  [phone]

## 2017-05-18 NOTE — Telephone Encounter (Signed)
Patient called back upset that I didn't leave the message on her VM, I advised that I can not leave information on a VM that doesn't announce herself. Pt is now asking for a ASAP referral to [facility] neurology.Pt went to ED on Saturday.

## 2017-05-19 NOTE — Telephone Encounter (Signed)
Spoke with [facility] and they will put the referral in for review by the physician and he will determine the urgency of the referral. Neurology is scheduling out to February for new patients.   Spoke with patient and advised that I spoke with neurology and she didn't like that she couldn't get an appt ASAP. Per pt she may go back to the emergency room because things are changing everyday ( she didn't say what things) just said something's she can no longer do. Pt stated we [facility] & Neurology) don't understand the urgency of her appt.

## 2017-05-19 NOTE — Telephone Encounter (Signed)
The referral was placed as urgent.  When I had reviewed the ED note, I thought the referral had already been placed, but apparently it had not, so I did it immediately when I learned otherwise.  Can you work the referral if it has not been done, please?

## 2017-05-19 NOTE — Telephone Encounter (Signed)
[facility] and had to leave a message for the referral, need to schedule pt an appt ASAP. Asked pt to call the office and to speak with Earlie Server or [place] if possible.

## 2017-05-21 ENCOUNTER — Other Ambulatory Visit (INDEPENDENT_AMBULATORY_CARE_PROVIDER_SITE_OTHER): Payer: Medicare Other

## 2017-05-21 ENCOUNTER — Encounter: Payer: Self-pay | Admitting: Neurology

## 2017-05-21 ENCOUNTER — Ambulatory Visit: Payer: Medicare Other | Admitting: Neurology

## 2017-05-21 VITALS — BP 144/70 | HR 98 | Ht 65.0 in | Wt 155.0 lb

## 2017-05-21 DIAGNOSIS — R471 Dysarthria and anarthria: Secondary | ICD-10-CM

## 2017-05-21 DIAGNOSIS — R531 Weakness: Secondary | ICD-10-CM

## 2017-05-21 DIAGNOSIS — R29898 Other symptoms and signs involving the musculoskeletal system: Secondary | ICD-10-CM

## 2017-05-21 LAB — CK: CK TOTAL: 198 U/L — AB (ref 7–177)

## 2017-05-21 LAB — C-REACTIVE PROTEIN: CRP: 0.1 mg/dL — ABNORMAL LOW (ref 0.5–20.0)

## 2017-05-21 LAB — TSH: TSH: 2.57 u[IU]/mL (ref 0.35–4.50)

## 2017-05-21 LAB — SEDIMENTATION RATE: SED RATE: 9 mm/h (ref 0–30)

## 2017-05-21 NOTE — Progress Notes (Signed)
[facility] Clinic Note - Initial Visit   Date: 05/21/17  Marilyn [name] MRN: [id number] DOB: January 08, 1941   Dear Dr. Mariea Clonts:  Thank you for your kind referral of Marilyn [name] for consultation of head drop. Although her history is well known to you, please allow [place] to reiterate it for the purpose of our medical record. The patient was accompanied to the clinic by self.    History of Present Illness: Marilyn [name] is a 76 y.o. right-handed Caucasian female with hypertension, history of right breast cancer s/p lumpectomy and radiation (2011) presenting for evaluation of head drop.  She is very active, lives alone, and is an avid Hospital doctor.  She was in [place], [place] on November 7th and accidentally spilled food on her blouse and felt that symptoms may have started around this time.  Over the next few weeks, she began having increased fatigue of her neck and soreness of upper arms.  She was visiting [place], [place] the week of Thanksgiving and noticed difficulty keeping her head upright, especially when driving.  She also describes weakness and greater effort when reaching overhead such as putting bird feeders on high hooks.  She has new intermittent hoarseness of the voice. There is no weakness of the legs.  She has history of strabismus and had two surgeries, there has been no worsening double vision. She sleeps on a recliner for 1.5 years due to GERD. She denies shortness of breath.  Symptoms tend to be worse at night and improved with rest.   She went to the ER on 12/8 due to inability to hold her head upright and had MRI brain and cervical spine.  She has degenerative age-related changes of the brain and disc osteophyte complex at C3-4 through C6-7, but there is no cord compression.  She has been using a soft neck collar for support.    Out-side paper records, electronic medical record, and images have been reviewed where available and summarized as:  MRI cervical  spine wo contrast 05/15/2017: Motion degradation of axial sequences. 1. No acute osseous abnormality or abnormal cord signal.  2. Reversal of cervical curvature and moderate cervical spine spondylosis. 3. Prominent disc osteophyte complexes at the C3-4 through C6-7 levels efface the ventral thecal sac with anterior cord contact and mild anterior cord flattening. No significant canal stenosis. No cord compression. 4. Multilevel mild foraminal stenosis. Moderate right C3-4 foraminal stenosis.  MRI brain wo contrast 05/15/2017:   1. No acute intracranial abnormality identified. 2. Mild for age chronic microvascular ischemic changes and mild parenchymal volume loss of the brain.    Past Medical History:  Diagnosis Date  . Abnormality of gait   . Alopecia, unspecified   . Altered mental status   . Breast cancer ([place])    stage 1; right  . Cataracts, bilateral   . Decreased rectal sphincter tone 07/01/12  . Diverticulosis   . Enterocele   . Esophageal stricture 07/01/12  . Hernia   . Hiatal hernia   . Hypertension   . IBS (irritable bowel syndrome)   . Irritable bowel syndrome   . Lumbago   . Migraine with aura, without mention of intractable migraine without mention of status migrainosus   . Mixed hearing loss, bilateral   . 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 Osteoarthrosis, unspecified whether generalized or localized, unspecified site   . Other and unspecified hyperlipidemia   . Plantar fascial fibromatosis   . Rectal prolapse   .  Reflux esophagitis   . Regional enteritis of small intestine ([place])   . Senile osteoporosis   . Spasm of muscle   . Unspecified constipation   . Unspecified hypothyroidism     Past Surgical History:  Procedure Laterality Date  . APPENDECTOMY    . BASAL CELL CARCINOMA EXCISION  1978   neck  . BREAST LUMPECTOMY Right    snbx, apbi  . Cataract removal OD  06/28/2012   . Cataract removal OS  12/13/2012  . dermatolfibroma  2012  .  DILATION AND CURETTAGE OF UTERUS    . ENTEROCELE REPAIR    . HAIR TRANSPLANT  2008  . HERNIA REPAIR    . OOPHORECTOMY    . strabisumus eye surgery     x 2  . TONSILLECTOMY       Medications:  Outpatient Encounter Medications as of 05/21/2017  Medication Sig  . acetaminophen (TYLENOL) 500 MG tablet Take 500 mg by mouth See admin instructions. 500 mg EVERY 4-6 HOURS AS NEEDED FOR PAIN WITH A MAX TOTAL DAILY DOSE OF 3,000 MG  . aspirin 81 MG tablet Take 81 mg by mouth daily.  [name] CALCIUM PO Take 1-2 tablets by mouth See admin instructions. 1 tablet once a day rotating with 2 tablets every other day  . Cholecalciferol (VITAMIN D-3) 1000 units CAPS Take 1,000 Units by mouth daily.   [name] conjugated estrogens (PREMARIN) vaginal cream Apply 0.5 g three times a month  . diphenhydrAMINE (BENADRYL) 25 MG tablet Take 12.5-25 mg by mouth at bedtime as needed for sleep.   . flutamide (EULEXIN) 125 MG capsule TAKE 2 CAPSULES EVERY DAY (Patient taking differently: Take 125 mg by mouth two times a day)  . hyoscyamine (LEVSIN SL) 0.125 MG SL tablet DISSOLVE 1 TABLET UNDER TONGUE EVERY 4-6 HOURS AS NEEDED FOR DIARRHEA (Patient taking differently: DISSOLVE 1 TABLET UNDER TONGUE EVERY 4-6 HOURS AS NEEDED FOR GAS)  . losartan (COZAAR) 50 MG tablet TAKE 1 TABLET BY MOUTH EVERY DAY (Patient taking differently: Take 50 mg by mouth once a day)  . methocarbamol (ROBAXIN) 500 MG tablet Take 1 tablet (500 mg total) by mouth at bedtime as needed for muscle spasms.  . minoxidil (LONITEN) 2.5 MG tablet Take 1.25 (0.5 tablet) once a day  . Multiple Vitamins-Minerals (MULTIVITAMIN & MINERAL PO) Take 1 tablet by mouth daily.  . naproxen sodium (ALEVE) 220 MG tablet Take 220 mg by mouth 2 (two) times daily as needed (for pain).  . pantoprazole (PROTONIX) 40 MG tablet TAKE 1 TABLET (40 MG TOTAL) BY MOUTH EVERY OTHER DAY.  . traMADol (ULTRAM) 50 MG tablet Take 1 tablet (50 mg total) daily as needed by mouth (severe neck pain).  (Patient taking differently: Take 50 mg by mouth daily as needed (for severe neck pain). )  . vitamin C (ASCORBIC ACID) 500 MG tablet Take 500 mg by mouth daily.  [name] Dextrin-Calcium ([employer] EASY FIBER/CALCIUM PO) Take 2 tablets by mouth 2 (two) times daily.  . [DISCONTINUED] Wheat Dextrin (BENEFIBER) POWD Mix 2 tablespoonsful into milk/cereal once a day   No facility-administered encounter medications on file as of 05/21/2017.      Allergies:  Allergies  Allergen Reactions  . Gabapentin Other (See Comments)    Balance disturbance  . Cetacaine [Butamben-Tetracaine-Benzocaine] Other (See Comments)    LOST HER SENSE OF TASTE FOR OVER A MONTH!!!    Family History: Family History  Problem Relation Age of Onset  . Hypertension Mother   .  Stroke Mother   . Alzheimer's disease Father   . Hypertension Brother   . Breast cancer Maternal Aunt   . Gallbladder disease Brother   . Colon cancer Neg Hx     Social History: Social History   Tobacco Use  . Smoking status: Never Smoker  . Smokeless tobacco: Never Used  Substance Use Topics  . Alcohol use: No  . Drug use: No   Social History   Social History Narrative   Lives alone in a 2 story home.  Has 2 dogs, no children.     Retired Economist.     Education: Oceanographer.      Review of Systems:  CONSTITUTIONAL: No fevers, chills, night sweats, or weight loss.   EYES: No visual changes or eye pain ENT: No hearing changes.  No history of nose bleeds.   RESPIRATORY: No cough, wheezing and shortness of breath.   CARDIOVASCULAR: Negative for chest pain, and palpitations.   GI: Negative for abdominal discomfort, blood in stools or black stools.  No recent change in bowel habits.   GU:  No history of incontinence.   MUSCLOSKELETAL: +history of joint pain or swelling.  No myalgias.   SKIN: Negative for lesions, rash, and itching.   HEMATOLOGY/ONCOLOGY: Negative for prolonged bleeding, bruising easily, and swollen nodes.   No history of cancer.   ENDOCRINE: Negative for cold or heat intolerance, polydipsia or goiter.   PSYCH:  No depression or anxiety symptoms.   NEURO: As Above.   Vital Signs:  BP (!) 144/70   Pulse 98   Ht _0  (1.651 m)   Wt 155 lb (70.3 kg)   SpO2 96%   BMI 25.79 kg/m    General Medical Exam:   General:  Well appearing, severe head drop, comfortable.   Eyes/ENT: see cranial nerve examination.   Neck: No masses appreciated.  Full range of motion without tenderness.  No carotid bruits. Respiratory:  Clear to auscultation, good air entry bilaterally.   Cardiac:  Regular rate and rhythm, no murmur.   Extremities:  No deformities, edema, or skin discoloration.  Skin:  No rashes or lesions.  Neurological Exam: MENTAL STATUS including orientation to time, place, person, recent and remote memory, attention span and concentration, language, and fund of knowledge is normal.  Speech is not dysarthric.  CRANIAL NERVES: II:  No visual field defects.  Unremarkable fundi.   III-IV-VI: Pupils equal round and reactive to light.  Normal conjugate, extra-ocular eye movements in all directions of gaze.  No nystagmus.  No ptosis at rest or with sustained upgaze.   V:  Normal facial sensation.  Jaw jerk is negative.   VII:  Normal facial symmetry and movements.  There is no facial weakness. No pathologic facial reflexes.  VIII:  Normal hearing and vestibular function.   IX-X:  Normal palatal movement.   XI:  Normal shoulder shrug and head rotation.   XII:  Normal tongue strength and range of motion, no deviation or fasciculation.  MOTOR:  No atrophy, fasciculations or abnormal movements.  No pronator drift.  Tone is normal.   Neck flexion 4/5, neck extension is 3-/5  Right Upper Extremity:    Left Upper Extremity:    Deltoid  4+/5   Deltoid  4+/5   Biceps  5/5   Biceps  5/5   Triceps  5/5   Triceps  5/5   Wrist extensors  5/5   Wrist extensors  5/5   Wrist flexors  5/5   Wrist flexors  5/5    Finger extensors  5/5   Finger extensors  5/5   Finger flexors  5/5   Finger flexors  5/5   Dorsal interossei  5/5   Dorsal interossei  5/5   Abductor pollicis  5/5   Abductor pollicis  5/5   Tone (Ashworth scale)  0  Tone (Ashworth scale)  0   Right Lower Extremity:    Left Lower Extremity:    Hip flexors  5/5   Hip flexors  5/5   Hip extensors  5/5   Hip extensors  5/5   Knee flexors  5/5   Knee flexors  5/5   Knee extensors  5/5   Knee extensors  5/5   Dorsiflexors  5/5   Dorsiflexors  5/5   Plantarflexors  5/5   Plantarflexors  5/5   Toe extensors  5/5   Toe extensors  5/5   Toe flexors  5/5   Toe flexors  5/5   Tone (Ashworth scale)  0  Tone (Ashworth scale)  0   MSRs:  Right                                                                 Left brachioradialis 2+  brachioradialis 2+  biceps 2+  biceps 2+  triceps 2+  triceps 2+  patellar 2+  patellar 2+  ankle jerk 1+  ankle jerk 1+  Hoffman no  Hoffman no  plantar response down  plantar response down   SENSORY:  Normal and symmetric perception of light touch, vibration, and temperature.    COORDINATION/GAIT: Normal finger-to- nose-finger.  Intact rapid alternating movements bilaterally. Gait narrow based and stable, unassisted. Mild unsteadiness with tandem gait.   IMPRESSION: Marilyn [name] is a 76 year-old female referred for urgent evaluation of subacute onset of head drop with proximal arm weakness and dysarthria.  Bulbar muscles are intact on exam.  She has severe head drop with proximal arm weakness.  Differential is broad and includes myasthenia gravis, extensor myopathy, motor neuron disease and less likely other neurodegenerative condition (parkinsons). MRI brain and cervical spine personally viewed with patient which shows age-related degenerative changes and osteophyte complex at C3-4 through C6-7, but there is no cord compression.    PLAN/RECOMMENDATIONS:  1.  Check ESR, CRP, CK, aldolase, myasthenia gravis, TSH 2.   NCS/EMG of the right side - MG protocol 3.  She was instructed to go to the ER if she develops shortness of breath, worsening weakness, or dysphagia  Thank you for allowing me to participate in patient's care.  If I can answer any additional questions, I would be pleased to do so.    Sincerely,    Arben Packman K. Posey Pronto, DO

## 2017-05-21 NOTE — Patient Instructions (Addendum)
1.  Check labs 2.  NCS/EMG of the right arm and leg 3.  If you develop any shortness of breath, worsening arm/leg weakness, or difficulty swallowing, go directly to the ER

## 2017-05-25 ENCOUNTER — Ambulatory Visit (INDEPENDENT_AMBULATORY_CARE_PROVIDER_SITE_OTHER): Payer: Medicare Other | Admitting: Neurology

## 2017-05-25 DIAGNOSIS — R29898 Other symptoms and signs involving the musculoskeletal system: Secondary | ICD-10-CM | POA: Diagnosis not present

## 2017-05-25 DIAGNOSIS — R471 Dysarthria and anarthria: Secondary | ICD-10-CM

## 2017-05-25 DIAGNOSIS — G7001 Myasthenia gravis with (acute) exacerbation: Secondary | ICD-10-CM

## 2017-05-25 NOTE — Procedures (Signed)
[facility]  [address], [address]  [place], [place] [address] Tel: [phone] Fax:  [phone] Test Date:  05/25/2017  Patient: Marilyn [name] DOB: 22-Nov-1940 Physician: Narda Amber, DO  Sex: Female Height: 5\' 5"  Ref Phys: Narda Amber, DO  ID#: [id number] Temp: 33.4C Technician:    Patient Complaints: This is a 76 year old female referred for evaluation of subacute onset of head drop, proximal arm weakness, and dysarthria.  NCV & EMG Findings: Extensive electrodiagnostic testing of the right upper and lower extremity shows:  1. Right median and superficial peroneal sensory responses are within normal limits. 2. Right median and peroneal motor responses are within normal limits. 3. Repetitive nerve stimulation of the spinal accessory nerve shows abnormal decrement. Repetitive nerve stimulation of the median and peroneal nerves recording at the abductor pollicis brevis and tibialis anterior, respectively, is within normal limits. 4. There is no evidence of active or chronic motor axon loss changes affecting any of the tested muscles. Motor unit configuration and recruitment pattern is within normal limits.  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   ___________________________ Narda Amber, DO    Nerve Conduction Studies Anti Sensory Summary Table   Site NR Peak (ms) Norm Peak (ms) P-T Amp (V) Norm P-T Amp  Right Median Anti Sensory (2nd Digit)  33.4C  Wrist    3.3 <3.8 20.9 >10  Right Sup Peroneal Anti Sensory (Ant Lat Mall)  33.4C  12 cm    3.5 <4.6 3.7 >3   Motor Summary Table   Site NR Onset (ms) Norm Onset (ms) O-P Amp (mV) Norm O-P Amp Site1 Site2 Delta-0 (ms) Dist (cm) Vel (m/s) Norm Vel (m/s)  Right Median Motor (Abd Poll Brev)  33.4C  Wrist    3.2 <4.0 6.7 >5 Elbow Wrist 4.7 26.0 55 >50  Elbow    7.9  6.7         Right Peroneal Motor (Ext Dig Brev)  33.4C  Ankle     3.0 <6.0 2.5 >2.5 B Fib Ankle 7.9 34.0 43 >40  B Fib    10.9  2.3  Poplt B Fib 1.4 8.0 57 >40  Poplt    12.3  2.3         Right Peroneal TA Motor (Tib Ant)  33.4C  Fib Head    2.4 <4.5 3.7 >3 Poplit Fib Head 1.7 8.0 47 >40  Poplit    4.1  3.7          EMG   Side Muscle Ins Act Fibs Psw Fasc Number Recrt Dur Dur. Amp Amp. Poly Poly. Comment  Right AntTibialis Nml Nml Nml Nml Nml Nml Nml Nml Nml Nml Nml Nml N/A  Right Gastroc Nml Nml Nml Nml Nml Nml Nml Nml Nml Nml Nml Nml N/A  Right RectFemoris Nml Nml Nml Nml Nml Nml Nml Nml Nml Nml Nml Nml N/A  Right GluteusMed Nml Nml Nml Nml Nml Nml Nml Nml Nml Nml Nml Nml N/A  Right 1stDorInt Nml Nml Nml Nml Nml Nml Nml Nml Nml Nml Nml Nml N/A  Right PronatorTeres Nml Nml Nml Nml Nml Nml Nml Nml Nml Nml Nml Nml N/A  Right Biceps Nml Nml Nml Nml Nml Nml Nml Nml Nml Nml Nml Nml N/A  Right Triceps Nml Nml Nml Nml Nml Nml Nml Nml Nml Nml Nml Nml N/A  Right Deltoid Nml Nml Nml Nml Nml Nml Nml Nml Nml Nml Nml Nml N/A   RNS  Trial # Label Amp 1 (mV)  O-P Amp 5 (mV)  O-P Amp % Dif Area 1 (mVms) Area 5 (mVms) Area % Dif Rep Rate Train Length Pause Time (min:sec) Comments  Right Abd Poll Brev - Run #1  Tr 1 Baseline 6.45 6.39 -0.9 22.35 20.39 -8.8 3.00 10 00:30   Tr 2 Post Exercise 6.31 6.57 4.0 19.89 19.71 -0.9 3.00 10 01:00   Tr 3 1 Min Post 6.69 6.72 0.4 21.95 19.95 -9.1 3.00 10 01:00   Tr 4 2 Min Post 7.26 6.84 -5.8 21.63 19.45 -10.1 3.00 10 01:00   Tr 5 3 Min Post 7.32 6.83 -6.7 21.16 19.06 -9.9 3.00 10 00:00   Right Trapezius - Run #1  Tr 2 Baseline 6.40 5.83 -8.9 40.72 35.83 -12.0 3.00 10 00:30   Tr 3 Post Exercise 5.86 5.36 -8.6 42.03 35.94 -14.5 3.00 10 01:00   Tr 4 1 Min Post 6.13 5.70 -7.0 41.67 36.29 -12.9 3.00 10 01:00   Tr 5 2 Min Post 5.94 3.79 -36.2 40.02 23.39 -41.6 3.00 10 01:00   Tr 7 3 Min Post 4.93 3.92 -20.4 30.99 23.71 -23.5 3.00 10 00:00   Right AntTibialis  Tr 1 Baseline 4.58 4.64 1.4 23.01 22.20 -3.5 3.00 10 00:30   Tr 2  Post Exercise 4.47 4.52 1.1 21.94 21.78 -0.7 3.00 10 01:00   Tr 3 1 Min Post 4.16 4.30 3.5 24.85 24.27 -2.4 3.00 10 01:00   Tr 4 2 Min Post 4.26 4.30 0.9 24.55 23.38 -4.8 3.00 10 01:00   Tr 5 3 Min Post 4.06 3.88 -4.3 24.26 21.44 -11.6 3.00 10 00:00                  Waveforms:

## 2017-05-26 ENCOUNTER — Encounter: Payer: Self-pay | Admitting: Neurology

## 2017-05-26 ENCOUNTER — Ambulatory Visit: Payer: Medicare Other | Admitting: Neurology

## 2017-05-26 VITALS — BP 140/80 | HR 97 | Ht 65.0 in | Wt 154.4 lb

## 2017-05-26 DIAGNOSIS — R471 Dysarthria and anarthria: Secondary | ICD-10-CM

## 2017-05-26 DIAGNOSIS — G7001 Myasthenia gravis with (acute) exacerbation: Secondary | ICD-10-CM

## 2017-05-26 DIAGNOSIS — R0602 Shortness of breath: Secondary | ICD-10-CM | POA: Diagnosis not present

## 2017-05-26 DIAGNOSIS — R29898 Other symptoms and signs involving the musculoskeletal system: Secondary | ICD-10-CM | POA: Diagnosis not present

## 2017-05-26 MED ORDER — PREDNISONE 10 MG PO TABS: 30.0000 mg | ORAL_TABLET | Freq: Every day | ORAL | 5 refills | Status: DC

## 2017-05-26 MED ORDER — PYRIDOSTIGMINE BROMIDE 60 MG PO TABS: ORAL_TABLET | ORAL | 5 refills | Status: DC

## 2017-05-26 NOTE — Patient Instructions (Addendum)
1.  CT chest with contrast  2.  Start prednisone 30mg  daily 3.  Start mestinon 30mg  (half tablet) three times daily, if tolerating increase to 1 tablet three times daily - 9am, 2pm, and 7pm  Patient Instructions: -maintain calcium intake of 1200 mg/day and vitamin D intake of 800 international units/day through either diet and/or supplements -start proton pump inhibitor to avoid gastric ulcers (prilosec, omeprazole, protonix) -annual influenza and multivalent pneumococcal vaccination -perform weight-bearing exercises   Return to clinic on January 11th at 11:30am

## 2017-05-26 NOTE — Progress Notes (Signed)
Follow-up Visit   Date: 05/26/17    Marilyn [name] MRN: [id number] DOB: 1941-04-22   Interim History: Marilyn [name] is a 76 y.o. right-handed Caucasian female with hypertension, history of right breast cancer s/p lumpectomy and radiation (2011) returning to the clinic for follow-up of head drop, proximal arm weakness, and dysarthria.  The patient was accompanied to the clinic by self.  She is very active, lives alone, and is an avid bird Engineer, maintenance (IT).  History of present illness:  She was in [place], [place] on November 7th and accidentally spilled food on her blouse and felt that symptoms may have started around this time.  Over the next few weeks, she began having increased fatigue of her neck and soreness of upper arms.  She was visiting [place], [place] the week of Thanksgiving and noticed difficulty keeping her head upright, especially when driving.  She also describes weakness and greater effort when reaching overhead such as putting bird feeders on high hooks.  She has new intermittent hoarseness of the voice. There is no weakness of the legs.  She has history of strabismus and had two surgeries, there has been no worsening double vision. She sleeps on a recliner for 1.5 years due to GERD. She denies shortness of breath.  Symptoms tend to be worse at night and improved with rest.   She went to the ER on 12/8 due to inability to hold her head upright and had MRI brain and cervical spine.  She has degenerative age-related changes of the brain and disc osteophyte complex at C3-4 through C6-7, but there is no cord compression.  She has been using a soft neck collar for support.    UPDATE 05/26/2017:  She is here for follow-up visit to discuss the results of EMG which was consistent with neuromuscular junction disorder, such as myasthenia gravis.  Since she was last here, she feels that she can get tired easier and gets winded.  Otherwise, she continues to have severe head drop and  arm weakness.  No difficulty swallowing.  There is mild elevation in her CK, aldolase is normal.  Myasthenia antibodies have not resulted.   Medications:  Current Outpatient Medications on File Prior to Visit  Medication Sig Dispense Refill  . acetaminophen (TYLENOL) 500 MG tablet Take 500 mg by mouth See admin instructions. 500 mg EVERY 4-6 HOURS AS NEEDED FOR PAIN WITH A MAX TOTAL DAILY DOSE OF 3,000 MG    . aspirin 81 MG tablet Take 81 mg by mouth daily.    [name] CALCIUM PO Take 1-2 tablets by mouth See admin instructions. 1 tablet once a day rotating with 2 tablets every other day    . Cholecalciferol (VITAMIN D-3) 1000 units CAPS Take 1,000 Units by mouth daily.     [name] conjugated estrogens (PREMARIN) vaginal cream Apply 0.5 g three times a month    . diphenhydrAMINE (BENADRYL) 25 MG tablet Take 12.5-25 mg by mouth at bedtime as needed for sleep.     . flutamide (EULEXIN) 125 MG capsule TAKE 2 CAPSULES EVERY DAY (Patient taking differently: Take 125 mg by mouth two times a day) 60 capsule 1  . hyoscyamine (LEVSIN SL) 0.125 MG SL tablet DISSOLVE 1 TABLET UNDER TONGUE EVERY 4-6 HOURS AS NEEDED FOR DIARRHEA (Patient taking differently: DISSOLVE 1 TABLET UNDER TONGUE EVERY 4-6 HOURS AS NEEDED FOR GAS) 40 tablet 1  . losartan (COZAAR) 50 MG tablet TAKE 1 TABLET BY MOUTH EVERY DAY (Patient taking  differently: Take 50 mg by mouth once a day) 90 tablet 1  . methocarbamol (ROBAXIN) 500 MG tablet Take 1 tablet (500 mg total) by mouth at bedtime as needed for muscle spasms. 30 tablet 0  . minoxidil (LONITEN) 2.5 MG tablet Take 1.25 (0.5 tablet) once a day  10  . Multiple Vitamins-Minerals (MULTIVITAMIN & MINERAL PO) Take 1 tablet by mouth daily.    . naproxen sodium (ALEVE) 220 MG tablet Take 220 mg by mouth 2 (two) times daily as needed (for pain).    . pantoprazole (PROTONIX) 40 MG tablet TAKE 1 TABLET (40 MG TOTAL) BY MOUTH EVERY OTHER DAY. 30 tablet 3  . traMADol (ULTRAM) 50 MG tablet Take 1 tablet (50 mg  total) daily as needed by mouth (severe neck pain). (Patient taking differently: Take 50 mg by mouth daily as needed (for severe neck pain). ) 30 tablet 0  . vitamin C (ASCORBIC ACID) 500 MG tablet Take 500 mg by mouth daily.    [name] Dextrin-Calcium ([employer] EASY FIBER/CALCIUM PO) Take 2 tablets by mouth 2 (two) times daily.     No current facility-administered medications on file prior to visit.     Allergies:  Allergies  Allergen Reactions  . Gabapentin Other (See Comments)    Balance disturbance  . Cetacaine [Butamben-Tetracaine-Benzocaine] Other (See Comments)    LOST HER SENSE OF TASTE FOR OVER A MONTH!!!    Review of Systems:  CONSTITUTIONAL: No fevers, chills, night sweats, or weight loss.  EYES: No visual changes or eye pain ENT: No hearing changes.  No history of nose bleeds.   RESPIRATORY: No cough, wheezing and shortness of breath.   CARDIOVASCULAR: Negative for chest pain, and palpitations.   GI: Negative for abdominal discomfort, blood in stools or black stools.  No recent change in bowel habits.   GU:  No history of incontinence.   MUSCLOSKELETAL: No history of joint pain or swelling.  No myalgias.   SKIN: Negative for lesions, rash, and itching.   ENDOCRINE: Negative for cold or heat intolerance, polydipsia or goiter.   PSYCH:  No depression or anxiety symptoms.   NEURO: As Above.   Vital Signs:  BP 140/80   Pulse 97   Ht 5\' 5"  (1.651 m)   Wt 154 lb 6 oz (70 kg)   SpO2 96%   BMI 25.69 kg/m    General: Severe head drop, comfortable  Neurological Exam: MENTAL STATUS including orientation to time, place, person, recent and remote memory, attention span and concentration, language, and fund of knowledge is normal.  Speech is not dysarthric, mild hoarseness.  CRANIAL NERVES:   Pupils equal round and reactive to light.  Normal conjugate, extra-ocular eye movements in all directions of gaze.  No ptosis.  Face is symmetric. There is mild weakness of the buccinator  muscles.  Palate elevates symmetrically.  Tongue is midline, mild weakness with tongue protrusion   MOTOR:  No atrophy, fasciculations or abnormal movements.  No pronator drift.  Tone is normal.   Neck flexion is 4/5 Next extension is 2/5  Right Upper Extremity:    Left Upper Extremity:    Deltoid  4+/5   Deltoid  4+/5   Biceps  5/5   Biceps  5/5   Triceps  5/5   Triceps  5/5   Wrist extensors  5/5   Wrist extensors  5/5   Wrist flexors  5/5   Wrist flexors  5/5   Finger extensors  5/5  Finger extensors  5/5   Finger flexors  5/5   Finger flexors  5/5   Dorsal interossei  5/5   Dorsal interossei  5/5   Abductor pollicis  5/5   Abductor pollicis  5/5   Tone (Ashworth scale)  0  Tone (Ashworth scale)  0   Right Lower Extremity:    Left Lower Extremity:    Hip flexors  5-/5   Hip flexors  5-/5   Hip extensors  5/5   Hip extensors  5/5   Knee flexors  5/5   Knee flexors  5/5   Knee extensors  5/5   Knee extensors  5/5   Dorsiflexors  5/5   Dorsiflexors  5/5   Plantarflexors  5/5   Plantarflexors  5/5   Toe extensors  5/5   Toe extensors  5/5   Toe flexors  5/5   Toe flexors  5/5   Tone (Ashworth scale)  0  Tone (Ashworth scale)  0    COORDINATION/GAIT:  Severe head drop, gait appears narrow based and stable. She is able to stand without using arms to push off chair.   Data: NCS/EMG of the right side 05/26/2017: The electrophysiologic findings are most consistent with a postsynaptic neuromuscular junction disorder.  Lab Results  Component Value Date   CKTOTAL 198 (H) 05/21/2017   Lab Results  Component Value Date   TSH 2.57 05/21/2017   MG panel - pending  IMPRESSION/PLAN: 1.  Myasthenia gravis manifesting with head drop, proximal arm weakness, and dysarthria.  Antibody panel is pending. Extensive discussion with patient regarding diagnosis, prognosis, management plan, meds to avoid, and potential crisis.  The patient was counseled extensively regarding the pertinent  warning signs and symptoms of MG crisis (including significant swallowing and breathing difficulty) that should prompt urgent medical evaluation since an exacerbation of myasthenia gravis is potentially life-threatening.    - CT chest with contrast to evaluate for thymoma  - Start prednisone 30mg  daily.  She was counseled on potential adverse effects of steroid therapy.  - Start mestinon 60mg  three times daily  2.  Chronic steroid use  - maintain calcium intake of 1200 mg/day and vitamin D intake of 800 IU/d through either diet and/or supplements  - start proton pump inhibitor to avoid gastric ulcers (prilosec, omeprazole, protonix)  -perform weight-bearing exercises art   Return to clinic in 3 weeks  The duration of this appointment visit was 45 minutes of face-to-face time with the patient.  Greater than 50% of this time was spent in counseling, explanation of diagnosis, planning of further management, and coordination of care.   Thank you for allowing me to participate in patient's care.  If I can answer any additional questions, I would be pleased to do so.    Sincerely,    Donika K. Posey Pronto, DO

## 2017-05-27 ENCOUNTER — Telehealth: Payer: Self-pay

## 2017-05-27 NOTE — Telephone Encounter (Signed)
Pt called and had a couple of questions about her medications.  States that she has been experiencing a little diarrhea and has taken Hyoscyamine - was wondering if there were any contraindications with that.  I advised that I didn't know of any, and if she were to start to have adverse symptoms to stop Hyoscyamine.  Pt also asked about the schedule for her Mestinon.  States that she is to take it at 9am, 2pm and 7pm.  Asked if she were to wake up later, say around 11am and take first dose then, should she move the remaining doses for the day back by 2 hours.  I advised that yes, she should.

## 2017-05-28 ENCOUNTER — Other Ambulatory Visit: Payer: Self-pay | Admitting: Internal Medicine

## 2017-05-29 LAB — ALDOLASE: Aldolase: 4.9 U/L (ref <=8.1)

## 2017-05-29 LAB — MYASTHENIA GRAVIS PANEL 2: Acetylchol Modul Ab: 30 % Inhibition

## 2017-05-31 ENCOUNTER — Telehealth: Payer: Self-pay | Admitting: *Deleted

## 2017-05-31 NOTE — Telephone Encounter (Signed)
Patient given results and is tolerating medication ok.

## 2017-05-31 NOTE — Telephone Encounter (Signed)
-----   Message from Alda Berthold, DO sent at 05/31/2017  8:38 AM EST ----- Please notify patient lab are within normal limits. Also, find ask to see how she is tolerating the medication and if there are any changes? Her CT is scheduled for Wednesday - we'll call her with the results, when it is ready. Thank you.

## 2017-06-02 ENCOUNTER — Ambulatory Visit
Admission: RE | Admit: 2017-06-02 | Discharge: 2017-06-02 | Disposition: A | Discharge status: Home / Self Care | Payer: Medicare Other | Source: Ambulatory Visit | Attending: Neurology | Admitting: Neurology

## 2017-06-02 DIAGNOSIS — G7001 Myasthenia gravis with (acute) exacerbation: Secondary | ICD-10-CM

## 2017-06-02 MED ORDER — IOPAMIDOL (ISOVUE-300) INJECTION 61%
75.0000 mL | Freq: Once | INTRAVENOUS | Status: AC | PRN
  Administered 2017-06-02: 75 mL via INTRAVENOUS

## 2017-06-03 ENCOUNTER — Encounter: Payer: Medicare Other | Admitting: Neurology

## 2017-06-03 ENCOUNTER — Telehealth: Payer: Self-pay | Admitting: Neurology

## 2017-06-03 NOTE — Telephone Encounter (Signed)
Patient called and needs to speak with you regarding her results. Also, she would like it before she sees Dr. Posey Pronto. Please Call. Thanks

## 2017-06-04 ENCOUNTER — Telehealth: Payer: Self-pay | Admitting: Neurology

## 2017-06-04 ENCOUNTER — Telehealth: Payer: Self-pay | Admitting: *Deleted

## 2017-06-04 NOTE — Telephone Encounter (Signed)
Left message for patient to call me back. 

## 2017-06-04 NOTE — Telephone Encounter (Signed)
Gave patient results and instructions.

## 2017-06-04 NOTE — Telephone Encounter (Signed)
See previous note

## 2017-06-04 NOTE — Telephone Encounter (Signed)
-----   Message from Alda Berthold, DO sent at 06/04/2017  9:20 AM EST ----- There is no thymic mass on her CT.  She has an enlarged bile duct, but liver enzymes are within normal limits so do not recommend additional work-up.  If she develops abdominal pain, this can be reassessed.

## 2017-06-04 NOTE — Telephone Encounter (Signed)
Patient called and Lmom that she has some questions for you. Thanks

## 2017-06-04 NOTE — Telephone Encounter (Signed)
I will call patient back with results that were just received this morning.

## 2017-06-07 ENCOUNTER — Telehealth: Payer: Self-pay | Admitting: Neurology

## 2017-06-07 NOTE — Telephone Encounter (Signed)
Yes

## 2017-06-07 NOTE — Telephone Encounter (Signed)
Overdue for Shingles shot. Can she have her Shot now? Thanks

## 2017-06-18 ENCOUNTER — Encounter: Payer: Self-pay | Admitting: Neurology

## 2017-06-18 ENCOUNTER — Other Ambulatory Visit (INDEPENDENT_AMBULATORY_CARE_PROVIDER_SITE_OTHER): Payer: Medicare Other

## 2017-06-18 ENCOUNTER — Ambulatory Visit: Payer: Medicare Other | Admitting: Neurology

## 2017-06-18 VITALS — BP 120/70 | HR 73 | Ht 65.0 in | Wt 154.2 lb

## 2017-06-18 DIAGNOSIS — R29898 Other symptoms and signs involving the musculoskeletal system: Secondary | ICD-10-CM

## 2017-06-18 DIAGNOSIS — G7001 Myasthenia gravis with (acute) exacerbation: Secondary | ICD-10-CM

## 2017-06-18 LAB — COMPREHENSIVE METABOLIC PANEL
ALT: 21 U/L (ref 0–35)
AST: 21 U/L (ref 0–37)
Albumin: 4.4 g/dL (ref 3.5–5.2)
Alkaline Phosphatase: 73 U/L (ref 39–117)
BUN: 22 mg/dL (ref 6–23)
CHLORIDE: 101 meq/L (ref 96–112)
CO2: 28 meq/L (ref 19–32)
CREATININE: 0.82 mg/dL (ref 0.40–1.20)
Calcium: 9.5 mg/dL (ref 8.4–10.5)
GFR: 71.79 mL/min (ref >60.00)
GLUCOSE: 100 mg/dL — AB (ref 70–99)
POTASSIUM: 4.1 meq/L (ref 3.5–5.1)
SODIUM: 136 meq/L (ref 135–145)
Total Bilirubin: 0.4 mg/dL (ref 0.2–1.2)
Total Protein: 7.2 g/dL (ref 6.0–8.3)

## 2017-06-18 MED ORDER — PREDNISONE 20 MG PO TABS: 60.0000 mg | ORAL_TABLET | Freq: Every day | ORAL | 1 refills | Status: DC

## 2017-06-18 NOTE — Progress Notes (Signed)
Follow-up Visit   Date: 06/18/17    Marilyn [name] MRN: [id number] DOB: 03/31/1941   Marilyn [name]: Marilyn [name] is a 77 y.o. right-handed Caucasian female with hypertension, [name] of right breast cancer s/p lumpectomy and radiation (2011) returning to the clinic for follow-up of head drop, proximal arm weakness, and dysarthria.  The patient was accompanied to the clinic by self.  She is very active, lives alone, and is an avid bird Engineer, maintenance (IT).  [name] of present illness:  She was in [place], [place] on November 7th and accidentally spilled food on her blouse and felt that symptoms may have started around this time.  Over the next few weeks, she began having increased fatigue of her neck and soreness of upper arms.  She was visiting [name], [place] the week of Thanksgiving and noticed difficulty keeping her head upright, especially when driving.  She also describes weakness and greater effort when reaching overhead such as putting bird feeders on high hooks.  She has new intermittent hoarseness of the voice. There is no weakness of the legs.  She has [name] of strabismus and had two surgeries, there has been no worsening double vision. She sleeps on a recliner for 1.5 years due to GERD. She denies shortness of breath.  Symptoms tend to be worse at night and improved with rest.   She went to the ER on 12/8 due to inability to hold her head upright and had MRI brain and cervical spine.  She has degenerative age-related changes of the brain and disc osteophyte complex at C3-4 through C6-7, but there is no cord compression.  She has been using a soft neck collar for support.    UPDATE 05/26/2017:  She is here for follow-up visit to discuss the results of EMG which was consistent with neuromuscular junction disorder, such as myasthenia gravis.  Since she was last here, she feels that she can get tired easier and gets winded.  Otherwise, she continues to have severe head drop and  arm weakness.  No difficulty swallowing.  There is mild elevation in her CK, aldolase is normal.  Myasthenia antibodies have not resulted.   UPDATE 06/18/2016:  She is here for 3 week follow-up visit after starting prednisone 30mg /d.  She has noticed mild improvement in voice, hand writing, vision, and stability with walking.  She has noticed less neck pain, but continues to have head drop.  When she is walking and sitting, she manually uses her arm to hold her head upright.  She drives with a towel folded under her chin, as she did not tolerate neck collar.   She has some shortness of breath with exertion, such as climbing stairs. She has significantly limited her activity because of this, such as household chores.   Medications:  Current Outpatient Medications on File Prior to Visit  Medication Sig Dispense Refill  . acetaminophen (TYLENOL) 500 MG tablet Take 500 mg by mouth See admin instructions. 500 mg EVERY 4-6 HOURS AS NEEDED FOR PAIN WITH A MAX TOTAL DAILY DOSE OF 3,000 MG    . aspirin 81 MG tablet Take 81 mg by mouth daily.    [name] CALCIUM PO Take 1-2 tablets by mouth See admin instructions. 1 tablet once a day rotating with 2 tablets every other day    . Cholecalciferol (VITAMIN D-3) 1000 units CAPS Take 1,000 Units by mouth daily.     [name] conjugated estrogens (PREMARIN) vaginal cream Apply 0.5 g three times a  month    . diphenhydrAMINE (BENADRYL) 25 MG tablet Take 12.5-25 mg by mouth at bedtime as needed for sleep.     . flutamide (EULEXIN) 125 MG capsule TAKE 2 CAPSULES EVERY DAY (Patient taking differently: Take 125 mg by mouth two times a day) 60 capsule 1  . hyoscyamine (LEVSIN SL) 0.125 MG SL tablet DISSOLVE 1 TABLET UNDER TONGUE EVERY 4-6 HOURS AS NEEDED FOR DIARRHEA 40 tablet 1  . losartan (COZAAR) 50 MG tablet TAKE 1 TABLET BY MOUTH EVERY DAY (Patient taking differently: Take 50 mg by mouth once a day) 90 tablet 1  . methocarbamol (ROBAXIN) 500 MG tablet Take 1 tablet (500 mg total) by  mouth at bedtime as needed for muscle spasms. 30 tablet 0  . minoxidil (LONITEN) 2.5 MG tablet Take 1.25 (0.5 tablet) once a day  10  . Multiple Vitamins-Minerals (MULTIVITAMIN & MINERAL PO) Take 1 tablet by mouth daily.    . naproxen sodium (ALEVE) 220 MG tablet Take 220 mg by mouth 2 (two) times daily as needed (for pain).    . pantoprazole (PROTONIX) 40 MG tablet TAKE 1 TABLET (40 MG TOTAL) BY MOUTH EVERY OTHER DAY. 30 tablet 3  . pyridostigmine (MESTINON) 60 MG tablet Take at 9am, 2pm, and 7pm. 90 tablet 5  . traMADol (ULTRAM) 50 MG tablet Take 1 tablet (50 mg total) daily as needed by mouth (severe neck pain). (Patient taking differently: Take 50 mg by mouth daily as needed (for severe neck pain). ) 30 tablet 0  . vitamin C (ASCORBIC ACID) 500 MG tablet Take 500 mg by mouth daily.    [name] Dextrin-Calcium ([employer] EASY FIBER/CALCIUM PO) Take 2 tablets by mouth 2 (two) times daily.     No current facility-administered medications on file prior to visit.     Allergies:  Allergies  Allergen Reactions  . Gabapentin Other (See Comments)    Balance disturbance  . Cetacaine [Butamben-Tetracaine-Benzocaine] Other (See Comments)    LOST HER SENSE OF TASTE FOR OVER A MONTH!!!    Review of Systems:  CONSTITUTIONAL: No fevers, chills, night sweats, or weight loss.  EYES: No visual changes or eye pain ENT: No hearing changes.  No [name] of nose bleeds.   RESPIRATORY: No cough, wheezing and shortness of breath.   CARDIOVASCULAR: Negative for chest pain, and palpitations.   GI: Negative for abdominal discomfort, blood in stools or black stools.  No recent change in bowel habits.   GU:  No [name] of incontinence.   MUSCLOSKELETAL: No [name] of joint pain or swelling.  No myalgias.   SKIN: Negative for lesions, rash, and itching.   ENDOCRINE: Negative for cold or heat intolerance, polydipsia or goiter.   PSYCH:  No depression or anxiety symptoms.   NEURO: As Above.   Vital Signs:  BP  120/70   Pulse 73   Ht 5\' 5"  (1.651 m)   Wt 154 lb 4 oz (70 kg)   SpO2 97%   BMI 25.67 kg/m     General: Marked head drop, comfortable Eyes/ENT: see cranial nerve examination.   Neck: No masses appreciated.  Full range of motion without tenderness.  No carotid bruits. Respiratory:  Clear to auscultation, good air entry bilaterally.   Cardiac:  Regular rate and rhythm, no murmur.     Neurological Exam: MENTAL STATUS including orientation to time, place, person, recent and remote memory, attention span and concentration, language, and fund of knowledge is normal.  Speech is not dysarthric, trace hoarseness.  CRANIAL NERVES:   Pupils equal round and reactive to light.  Normal conjugate, extra-ocular eye movements in all directions of gaze.  No ptosis.  Face is symmetric. Orbicularis oculi and oris is 5/5.  There is trace weakness of the buccinator muscles.  Palate elevates symmetrically.  Tongue is midline, strength is 5/5 (improved).  MOTOR:  No atrophy, fasciculations or abnormal movements.  No pronator drift.  Tone is normal.  There is no muscle fatigability of proximal limb muscle groups.  Neck flexion is 5/5 * Next extension is 2/5  Right Upper Extremity:    Left Upper Extremity:    Deltoid * 5/5   Deltoid * 5/5   Biceps  5/5   Biceps  5/5   Triceps  5/5   Triceps  5/5   Wrist extensors  5/5   Wrist extensors  5/5   Wrist flexors  5/5   Wrist flexors  5/5   Finger extensors  5/5   Finger extensors  5/5   Finger flexors  5/5   Finger flexors  5/5   Dorsal interossei  5/5   Dorsal interossei  5/5   Abductor pollicis  5/5   Abductor pollicis  5/5   Tone (Ashworth scale)  0  Tone (Ashworth scale)  0   Right Lower Extremity:    Left Lower Extremity:    Hip flexors  5/5   Hip flexors  5/5   Hip extensors  5/5   Hip extensors  5/5   Knee flexors  5/5   Knee flexors  5/5   Knee extensors  5/5   Knee extensors  5/5   Dorsiflexors  5/5   Dorsiflexors  5/5   Plantarflexors  5/5    Plantarflexors  5/5   Toe extensors  5/5   Toe extensors  5/5   Toe flexors  5/5   Toe flexors  5/5   Tone (Ashworth scale)  0  Tone (Ashworth scale)  0  *improved  COORDINATION/GAIT:  Severe head drop, gait appears narrow based and stable. She is able to stand without using arms to push off chair.   Data: NCS/EMG of the right side 05/26/2017: The electrophysiologic findings are most consistent with a postsynaptic neuromuscular junction disorder.  Lab Results  Component Value Date   CKTOTAL 198 (H) 05/21/2017   Lab Results  Component Value Date   TSH 2.57 05/21/2017   CT chest w contrast 06/02/2017: 1. Negative for mediastinal mass lesion. 2. Mild biapical pleural and parenchymal scarring. 3. Enlarged extrahepatic common bile duct, suggest correlation with appropriate laboratory values.  MG panel - negative  IMPRESSION/PLAN: 1.  Seronegative myasthenia gravis with exacerbation, thymoma negative.  Initial symptoms manifested with severe head drop, proximal arm weakness, and dysarthria.  We reviewed her work-up to-date including labs and CT chest.  She had a list of questions which were addressed. Today, she has improved limb strength, but her neck extensor weakness is severe, therefore recommend escalating immunotherapy.   - Check MuSK antibody, CMP  - Increase prednisone to 60mg  daily. I reviewed side effects of high dose steroids  - Continue mesinton 60mg  three times daily (9a, 1p, 7p)  - Start prior authorization for IVIG 400mg /kg x 5 days  2.  Chronic steroid use  - maintain calcium intake of 1200 mg/day and vitamin D intake of 800 IU/d through either diet and/or supplements  - start proton pump inhibitor to avoid gastric ulcers (prilosec, omeprazole, protonix)  - perform weight-bearing exercises  Return to clinic in 2-3 weeks  Greater than 50% of this 40 minute visit was spent in counseling, explanation of diagnosis, planning of further management, and coordination of  care.    Thank you for allowing me to participate in patient's care.  If I can answer any additional questions, I would be pleased to do so.    Sincerely,    Ethyl Vila K. Posey Pronto, DO

## 2017-06-18 NOTE — Patient Instructions (Addendum)
Increase prednisone to 60mg  daily  We will start prior authorization for Intravenous immunoglobulin   Check labs  Return to clinic on Tuesday January 29th at 1:30pm.

## 2017-06-23 ENCOUNTER — Other Ambulatory Visit: Payer: Self-pay | Admitting: *Deleted

## 2017-06-23 DIAGNOSIS — G7000 Myasthenia gravis without (acute) exacerbation: Secondary | ICD-10-CM

## 2017-06-23 LAB — MUSK ANTIBODIES: MuSK Antibodies: <1 U/mL

## 2017-06-23 MED ORDER — IMMUNE GLOBULIN (HUMAN) 5 GM/50ML IV SOLN: 1.0000 g/kg | INTRAVENOUS | 0 refills | Status: AC

## 2017-06-24 ENCOUNTER — Telehealth: Payer: Self-pay | Admitting: Neurology

## 2017-06-24 ENCOUNTER — Telehealth: Payer: Self-pay | Admitting: *Deleted

## 2017-06-24 NOTE — Telephone Encounter (Signed)
She is referring to mestinon 60mg   - ok to take 1 tab at 10:30am and 3:30pm.  She can take extra dose, as needed.

## 2017-06-24 NOTE — Telephone Encounter (Signed)
Patient called to let you know that she is taking the medication that is to boost her energy at 10:30AM and 3:30PM  2 x a day. Is that ok? Thanks

## 2017-06-24 NOTE — Telephone Encounter (Signed)
Called patient's insurance company and no PA required for the IVIG infusion.

## 2017-06-24 NOTE — Telephone Encounter (Signed)
Please advise 

## 2017-06-25 ENCOUNTER — Telehealth: Payer: Self-pay | Admitting: *Deleted

## 2017-06-25 NOTE — Telephone Encounter (Signed)
Attempted to contact patient. No answer and no vm.

## 2017-06-25 NOTE — Telephone Encounter (Signed)
-----   Message from Alda Berthold, DO sent at 06/23/2017  7:56 AM EST ----- Please inform pt that antibodies are negative and her electrolytes are normal.  Thanks.

## 2017-06-25 NOTE — Telephone Encounter (Signed)
Left message giving patient instructions and requested for her to call with any questions.

## 2017-06-25 NOTE — Telephone Encounter (Signed)
Left message giving patient results.  

## 2017-06-28 ENCOUNTER — Telehealth: Payer: Self-pay | Admitting: *Deleted

## 2017-06-28 NOTE — Telephone Encounter (Signed)
Called patient last week and left message letting her know about her upcoming appointments for her infusions.  Lavern from Short stay called me and said that patient called her and said that she did not have any upcoming appointments.  I have tried to call patient back and keep getting a fax signal.  Called her sister and left message for her to please have patient get in touch with me about this.

## 2017-06-28 NOTE — Telephone Encounter (Signed)
Patient given infusion dates and times.  06-30-17 and 07-01-17 at 8:00.

## 2017-06-30 ENCOUNTER — Ambulatory Visit (HOSPITAL_COMMUNITY)
Admission: RE | Admit: 2017-06-30 | Discharge: 2017-06-30 | Disposition: A | Discharge status: Home / Self Care | Payer: Medicare Other | Source: Ambulatory Visit | Attending: Neurology | Admitting: Neurology

## 2017-06-30 ENCOUNTER — Telehealth: Payer: Self-pay | Admitting: Neurology

## 2017-06-30 DIAGNOSIS — G7000 Myasthenia gravis without (acute) exacerbation: Secondary | ICD-10-CM | POA: Diagnosis not present

## 2017-06-30 MED ORDER — IMMUNE GLOBULIN (HUMAN) 20 GM/200ML IV SOLN
1.0000 g/kg | INTRAVENOUS | Status: DC
  Administered 2017-06-30: 09:00:00 70 g via INTRAVENOUS
  Filled 2017-06-30: qty 100

## 2017-06-30 NOTE — Discharge Instructions (Signed)

## 2017-06-30 NOTE — Telephone Encounter (Signed)
Patient called regarding a pain in her arm. She wanted to let you know. She will be available tomorrow afternoon. Thanks

## 2017-07-01 ENCOUNTER — Ambulatory Visit (HOSPITAL_COMMUNITY)
Admission: RE | Admit: 2017-07-01 | Discharge: 2017-07-01 | Disposition: A | Discharge status: Home / Self Care | Payer: Medicare Other | Source: Ambulatory Visit | Attending: Neurology | Admitting: Neurology

## 2017-07-01 DIAGNOSIS — G7000 Myasthenia gravis without (acute) exacerbation: Secondary | ICD-10-CM | POA: Insufficient documentation

## 2017-07-01 MED ORDER — IMMUNE GLOBULIN (HUMAN) 20 GM/200ML IV SOLN
1.0000 g/kg | INTRAVENOUS | Status: AC
  Administered 2017-07-01: 08:00:00 70 g via INTRAVENOUS
  Filled 2017-07-01: qty 100

## 2017-07-01 NOTE — Telephone Encounter (Signed)
I spoke with patient and she said that she did some light vacuuming on Sunday and is now having pain in her arm.  She just wanted to let us know. Informed her that I would let Dr. Posey Pronto know and we can discuss further at her Tuesday visit.

## 2017-07-06 ENCOUNTER — Ambulatory Visit (INDEPENDENT_AMBULATORY_CARE_PROVIDER_SITE_OTHER): Payer: Medicare Other | Admitting: Neurology

## 2017-07-06 ENCOUNTER — Encounter: Payer: Self-pay | Admitting: Neurology

## 2017-07-06 ENCOUNTER — Other Ambulatory Visit (INDEPENDENT_AMBULATORY_CARE_PROVIDER_SITE_OTHER): Payer: Medicare Other

## 2017-07-06 VITALS — BP 174/80 | HR 80 | Ht 65.0 in | Wt 155.5 lb

## 2017-07-06 DIAGNOSIS — R29898 Other symptoms and signs involving the musculoskeletal system: Secondary | ICD-10-CM | POA: Diagnosis not present

## 2017-07-06 DIAGNOSIS — G7001 Myasthenia gravis with (acute) exacerbation: Secondary | ICD-10-CM

## 2017-07-06 LAB — SEDIMENTATION RATE: Sed Rate: 36 mm/hr — ABNORMAL HIGH (ref 0–30)

## 2017-07-06 LAB — CK: Total CK: 120 U/L (ref 7–177)

## 2017-07-06 LAB — HEMOGLOBIN A1C: Hgb A1c MFr Bld: 5.7 % (ref 4.6–6.5)

## 2017-07-06 LAB — C-REACTIVE PROTEIN: CRP: 0.1 mg/dL — ABNORMAL LOW (ref 0.5–20.0)

## 2017-07-06 NOTE — Progress Notes (Signed)
Follow-up Visit   Date: 07/06/17    Marilyn [name] MRN: [id number] DOB: 11/02/1940   Interim History: Marilyn [name] is a 77 y.o. right-handed Caucasian female with hypertension, history of right breast cancer s/p lumpectomy and radiation (2011) returning to the clinic for follow-up of head drop, proximal arm weakness, and dysarthria.  The patient was accompanied to the clinic by self.  She is very active, lives alone, and is an avid bird Engineer, maintenance (IT).  History of present illness: She was in [place], [place] on November 7th and accidentally spilled food on her blouse and felt that symptoms may have started around this time.  Over the next few weeks, she began having increased fatigue of her neck and soreness of upper arms.  She was visiting [place], [place] the week of Thanksgiving and noticed difficulty keeping her head upright, especially when driving.  She also describes weakness and greater effort when reaching overhead such as putting bird feeders on high hooks.  She has new intermittent hoarseness of the voice. There is no weakness of the legs.  She has history of strabismus and had two surgeries, there has been no worsening double vision. She sleeps on a recliner for 1.5 years due to GERD. She denies shortness of breath.  Symptoms tend to be worse at night and improved with rest.   She went to the ER on 12/8 due to inability to hold her head upright and had MRI brain and cervical spine.  She has degenerative age-related changes of the brain and disc osteophyte complex at C3-4 through C6-7, but there is no cord compression.  She has been using a soft neck collar for support.    UPDATE 05/26/2017:  She is here for follow-up visit to discuss the results of EMG which was consistent with neuromuscular junction disorder, such as myasthenia gravis.  Since she was last here, she feels that she can get tired easier and gets winded.  Otherwise, she continues to have severe head drop and  arm weakness.  No difficulty swallowing.  There is mild elevation in her CK, aldolase is normal.  Myasthenia antibodies have not resulted.   UPDATE 06/18/2017:  She is here for 3 week follow-up visit after starting prednisone 80m/d.  She has noticed mild improvement in voice, hand writing, vision, and stability with walking.  She has noticed less neck pain, but continues to have head drop.  When she is walking and sitting, she manually uses her arm to hold her head upright.  She drives with a towel folded under her chin, as she did not tolerate neck collar.   She has some shortness of breath with exertion, such as climbing stairs. She has significantly limited her activity because of this, such as household chores.   UPDATE 07/06/2017:  She is here for follow-up visit.  Because of continued head drop, she underwent induction dose of IVIG last weekand has been on prednisone 60 mg daily. Unfortunately, she has not noticed any new improvement with neck extension. Although, she notes that she is able to put bird features on high hooks and now able to see it, wears previously she was only using her arms to guide her.  She feels that her handwriting is poor and she is getting fatigued more often.  She has dull achy pain over the right arm, which is worse with internal rotation of the forearm.  She denies any tingling or electrical pain of the right arm. She has tried  Tylenol which helps intermittently.    Medications:  Current Outpatient Medications on File Prior to Visit  Medication Sig Dispense Refill  . acetaminophen (TYLENOL) 500 MG tablet Take 500 mg by mouth See admin instructions. 500 mg EVERY 4-6 HOURS AS NEEDED FOR PAIN WITH A MAX TOTAL DAILY DOSE OF 3,000 MG    . aspirin 81 MG tablet Take 81 mg by mouth daily.    [name] CALCIUM PO Take 1-2 tablets by mouth See admin instructions. 1 tablet once a day rotating with 2 tablets every other day    . Cholecalciferol (VITAMIN D-3) 1000 units CAPS Take 1,000  Units by mouth daily.     [name] conjugated estrogens (PREMARIN) vaginal cream Apply 0.5 g three times a month    . diphenhydrAMINE (BENADRYL) 25 MG tablet Take 12.5-25 mg by mouth at bedtime as needed for sleep.     . flutamide (EULEXIN) 125 MG capsule TAKE 2 CAPSULES EVERY DAY (Patient taking differently: Take 125 mg by mouth two times a day) 60 capsule 1  . hyoscyamine (LEVSIN SL) 0.125 MG SL tablet DISSOLVE 1 TABLET UNDER TONGUE EVERY 4-6 HOURS AS NEEDED FOR DIARRHEA 40 tablet 1  . losartan (COZAAR) 50 MG tablet TAKE 1 TABLET BY MOUTH EVERY DAY (Patient taking differently: Take 50 mg by mouth once a day) 90 tablet 1  . methocarbamol (ROBAXIN) 500 MG tablet Take 1 tablet (500 mg total) by mouth at bedtime as needed for muscle spasms. 30 tablet 0  . minoxidil (LONITEN) 2.5 MG tablet Take 1.25 (0.5 tablet) once a day  10  . Multiple Vitamins-Minerals (MULTIVITAMIN & MINERAL PO) Take 1 tablet by mouth daily.    . mupirocin ointment (BACTROBAN) 2 % APPLY TO AREA ON RIGHT LOWER LEG DAILY UNTIL HEALED, COVER WITH BANDAID  0  . pantoprazole (PROTONIX) 40 MG tablet TAKE 1 TABLET (40 MG TOTAL) BY MOUTH EVERY OTHER DAY. 30 tablet 3  . predniSONE (DELTASONE) 20 MG tablet Take 3 tablets (60 mg total) by mouth daily. 90 tablet 1  . pyridostigmine (MESTINON) 60 MG tablet Take at 9am, 2pm, and 7pm. 90 tablet 5  . traMADol (ULTRAM) 50 MG tablet Take 1 tablet (50 mg total) daily as needed by mouth (severe neck pain). (Patient taking differently: Take 50 mg by mouth daily as needed (for severe neck pain). ) 30 tablet 0  . vitamin C (ASCORBIC ACID) 500 MG tablet Take 500 mg by mouth daily.    [name] Dextrin-Calcium ([employer] EASY FIBER/CALCIUM PO) Take 2 tablets by mouth 2 (two) times daily.     No current facility-administered medications on file prior to visit.     Allergies:  Allergies  Allergen Reactions  . Gabapentin Other (See Comments)    Balance disturbance  . Cetacaine [Butamben-Tetracaine-Benzocaine] Other  (See Comments)    LOST HER SENSE OF TASTE FOR OVER A MONTH!!!    Review of Systems:  CONSTITUTIONAL: No fevers, chills, night sweats, or weight loss.  EYES: No visual changes or eye pain ENT: No hearing changes.  No history of nose bleeds.   RESPIRATORY: No cough, wheezing and shortness of breath.   CARDIOVASCULAR: Negative for chest pain, and palpitations.   GI: Negative for abdominal discomfort, blood in stools or black stools.  No recent change in bowel habits.   GU:  No history of incontinence.   MUSCLOSKELETAL: No history of joint pain or swelling.  No myalgias.   SKIN: Negative for lesions, rash, and itching.   ENDOCRINE:  Negative for cold or heat intolerance, polydipsia or goiter.   PSYCH:  No depression or anxiety symptoms.   NEURO: As Above.   Vital Signs:  BP (!) 174/80   Pulse 80   Ht 5' 5"  (1.651 m)   Wt 155 lb 8 oz (70.5 kg)   BMI 25.88 kg/m    General: Marked head drop, comfortable Eyes/ENT: see cranial nerve examination.   Neck: No masses appreciated.  Full range of motion without tenderness.  No carotid bruits. Respiratory:  Clear to auscultation, good air entry bilaterally.   Cardiac:  Regular rate and rhythm, no murmur.     Neurological Exam: MENTAL STATUS including orientation to time, place, person, recent and remote memory, attention span and concentration, language, and fund of knowledge is normal.  Speech is not dysarthric, trace hoarseness.  CRANIAL NERVES:   Pupils equal round and reactive to light.  Normal conjugate, extra-ocular eye movements in all directions of gaze.  No ptosis.  Face is symmetric. Orbicularis oculi and oris is 5/5.  There is trace weakness of the buccinator muscles.  Palate elevates symmetrically.  Tongue is midline, strength is 5/5 (improved).  MOTOR:  No atrophy, fasciculations or abnormal movements.  No pronator drift.  Tone is normal.  There is no muscle fatigability of proximal limb muscle groups.  Neck flexion is 5/5  Next  extension is 2+/5  Right Upper Extremity:    Left Upper Extremity:    Deltoid  5/5   Deltoid  5/5   Biceps  5/5   Biceps  5/5   Triceps  5/5   Triceps  5/5   Wrist extensors  5/5   Wrist extensors  5/5   Wrist flexors  5/5   Wrist flexors  5/5   Finger extensors  5/5   Finger extensors  5/5   Finger flexors  5/5   Finger flexors  5/5   Dorsal interossei  5/5   Dorsal interossei  5/5   Abductor pollicis  5/5   Abductor pollicis  5/5   Tone (Ashworth scale)  0  Tone (Ashworth scale)  0   Right Lower Extremity:    Left Lower Extremity:    Hip flexors  5/5   Hip flexors  5/5   Hip extensors  5/5   Hip extensors  5/5   Knee flexors  5/5   Knee flexors  5/5   Knee extensors  5/5   Knee extensors  5/5   Dorsiflexors  5/5   Dorsiflexors  5/5   Plantarflexors  5/5   Plantarflexors  5/5   Toe extensors  5/5   Toe extensors  5/5   Toe flexors  5/5   Toe flexors  5/5   Tone (Ashworth scale)  0  Tone (Ashworth scale)  0   COORDINATION/GAIT:  Moderate head drop, gait appears narrow based and stable. She is able to stand without using arms to push off chair.   Data: NCS/EMG of the right side 05/26/2017: The electrophysiologic findings are most consistent with a postsynaptic neuromuscular junction disorder.  Lab Results  Component Value Date   CKTOTAL 120 07/06/2017   Lab Results  Component Value Date   TSH 2.57 05/21/2017   CT chest w contrast 06/02/2017: 1. Negative for mediastinal mass lesion. 2. Mild biapical pleural and parenchymal scarring. 3. Enlarged extrahepatic common bile duct, suggest correlation with appropriate laboratory values.  MG, MuSK panel - negative  Lab Results  Component Value Date   CREATININE 0.82  06/18/2017   BUN 22 06/18/2017   NA 136 06/18/2017   K 4.1 06/18/2017   CL 101 06/18/2017   CO2 28 06/18/2017   MRI cervical spine wo contrast 05/15/2017: 1. No acute osseous abnormality or abnormal cord signal. 2. Reversal of cervical curvature and moderate  cervical spine spondylosis. 3. Prominent disc osteophyte complexes at the C3-4 through C6-7 levels efface the ventral thecal sac with anterior cord contact and mild anterior cord flattening. No significant canal stenosis. No cord compression. 4. Multilevel mild foraminal stenosis. Moderate right C3-4 foraminal stenosis   IMPRESSION/PLAN: 1.  Seronegative myasthenia gravis with exacerbation, thymoma negative.  Initial symptoms manifested with severe head drop, proximal arm weakness, and dysarthria.  Due to severe neck extensor weakness, she was started on IVIG 2g/kg in mid-January and high dose corticosteroids (prednisone 69m/d).  Unfortunately, there has been very mild, if any, improvement with neck extensor weakness, even with escalating immunotherapy.  To be sure I'm not missing any underlying myopathy or infiltrative disease, I will check additional labs. If normal, she will undergo MRI cervical spine with and without contrast to assess for neck extensor myopathy as well as structural impingement. Her MRI from December 2018 was personally reviewed which shows multilevel osteophyte complex from C3-C7 with mild flattening of the anterior cord.  - Check CK, aldolase, SPEP with IFE, ACE, HbA1c, ESR, CRP  - Reduce prednisone to 533mx 2 weeks, then continue to taper by 1055mvery 2 weeks until at 4m4m  - Continue mesinton 60mg29mee times daily (9a, 1p, 7p)  - We may seek second neuromuscular opinion, if there is no improvement   2.  Chronic steroid use  - maintain calcium intake of 1200 mg/day and vitamin D intake of 800 IU/d through either diet and/or supplements  - continue proton pump inhibitor to avoid gastric ulcers (prilosec, omeprazole, protonix)  - perform weight-bearing exercises   Return to clinic in 3 weeks  Greater than 50% of this 40 minute visit was spent in counseling, explanation of diagnosis, planning of further management, and coordination of care due to the nature of her  complex condition.     Thank you for allowing me to participate in patient's care.  If I can answer any additional questions, I would be pleased to do so.    Sincerely,    Coline Calkin K. PatelPosey Pronto

## 2017-07-06 NOTE — Patient Instructions (Addendum)
Check labs  MRI cervical spine wwo contrast  Return to clinic in 2/19 at 3pm

## 2017-07-08 ENCOUNTER — Other Ambulatory Visit: Payer: Medicare Other

## 2017-07-09 ENCOUNTER — Telehealth: Payer: Self-pay | Admitting: Neurology

## 2017-07-09 NOTE — Telephone Encounter (Signed)
Yes, okay to take this.

## 2017-07-09 NOTE — Telephone Encounter (Signed)
Pt called back and said she needs to run out, said you can leave the MRI info on her voicemail and to please call her back Monday to discuss the medication questions

## 2017-07-09 NOTE — Telephone Encounter (Signed)
Called patient back and gave her the # to [place].   She wanted to let you know that she will be starting Terbinafine.  Is this ok with her other meds?

## 2017-07-09 NOTE — Telephone Encounter (Signed)
Pt called regarding her MRI and said she has not heard anything about scheduling an appointment, please call and she also has some medication questions too

## 2017-07-12 ENCOUNTER — Telehealth: Payer: Self-pay | Admitting: Neurology

## 2017-07-12 ENCOUNTER — Telehealth: Payer: Self-pay | Admitting: *Deleted

## 2017-07-12 LAB — PROTEIN ELECTROPHORESIS, SERUM
ALPHA 1: 0.2 g/dL (ref 0.2–0.3)
Albumin ELP: 4 g/dL (ref 3.8–4.8)
Alpha 2: 0.8 g/dL (ref 0.5–0.9)
BETA 2: 0.3 g/dL (ref 0.2–0.5)
BETA GLOBULIN: 0.5 g/dL (ref 0.4–0.6)
GAMMA GLOBULIN: 2.8 g/dL — AB (ref 0.8–1.7)
TOTAL PROTEIN: 8.4 g/dL — AB (ref 6.1–8.1)

## 2017-07-12 LAB — IMMUNOFIXATION ELECTROPHORESIS
IgG (Immunoglobin G), Serum: 2946 mg/dL — ABNORMAL HIGH (ref 694–1618)
IgM, Serum: 99 mg/dL (ref 48–271)
Immunofix Electr Int: NOT DETECTED
Immunoglobulin A: 99 mg/dL (ref 81–463)

## 2017-07-12 LAB — ALDOLASE: ALDOLASE: 5.2 U/L (ref <=8.1)

## 2017-07-12 LAB — ANGIOTENSIN CONVERTING ENZYME: ANGIOTENSIN-CONVERTING ENZYME: 38 U/L (ref 9–67)

## 2017-07-12 NOTE — Telephone Encounter (Signed)
Patient advised.

## 2017-07-12 NOTE — Telephone Encounter (Signed)
-----   Message from Alda Berthold, DO sent at 07/12/2017  3:01 PM EST ----- Please notify patient lab are within normal limits. Please also see if her neck is getting any stronger.  Thank you.

## 2017-07-12 NOTE — Telephone Encounter (Signed)
See next note

## 2017-07-12 NOTE — Telephone Encounter (Signed)
Called patient for clinical update as she is two weeks from IVIG induction therapy in addition to high dose prednisone.  She does not appreciate any improvement with neck strength/head drop.  She is having difficulty with poor hand writing, dropping objects, and difficulty focusing when texting.  She denies any double vision.  At her last visit, I check labs for myopathy as well as infiltrative disorders given that extensor weakness is atypical for myasthenia who normally presents with neck flexion weakness.  These returned normal.  I attempted to wean her prednisone to 50mg /d, but with her clinical setback she was instructed to take prednisone 60mg /d.  Given minimal response to IVIG, the next step is plasmapheresis. Because she lives alone and has two dogs, she will need to make arrangement for their care and then proceed to the emergency department for admission for MG exacerbation and management with PLEX. I have asked her to let me know prior to going to the ER, so I can update the inpatient neurology team.  Delena Serve. Posey Pronto, DO

## 2017-07-13 ENCOUNTER — Telehealth: Payer: Self-pay | Admitting: Neurology

## 2017-07-13 NOTE — Telephone Encounter (Signed)
Patient Lmom returning your call. Thanks

## 2017-07-14 ENCOUNTER — Telehealth: Payer: Self-pay | Admitting: Neurology

## 2017-07-14 NOTE — Telephone Encounter (Signed)
Pt returned your call.  

## 2017-07-14 NOTE — Telephone Encounter (Signed)
Patient Lmom for you to call her. She said she will be in the hospital next week. Thanks

## 2017-07-14 NOTE — Telephone Encounter (Signed)
I spoke with patient and she let me know that she will be going to the hospital on Monday, January 11.

## 2017-07-15 NOTE — Telephone Encounter (Signed)
Noted  

## 2017-07-16 ENCOUNTER — Telehealth: Payer: Self-pay | Admitting: *Deleted

## 2017-07-16 NOTE — Telephone Encounter (Signed)
Returned patient's call.  Left message that she can call me back.

## 2017-07-19 ENCOUNTER — Inpatient Hospital Stay (HOSPITAL_COMMUNITY)
Admission: EM | Admit: 2017-07-19 | Discharge: 2017-07-29 | DRG: 057 | Disposition: A | Discharge status: Home / Self Care | Payer: Medicare Other | Attending: Internal Medicine | Admitting: Internal Medicine

## 2017-07-19 ENCOUNTER — Other Ambulatory Visit: Payer: Self-pay

## 2017-07-19 ENCOUNTER — Inpatient Hospital Stay (HOSPITAL_COMMUNITY): Payer: Medicare Other

## 2017-07-19 ENCOUNTER — Encounter (HOSPITAL_COMMUNITY): Payer: Self-pay | Admitting: *Deleted

## 2017-07-19 DIAGNOSIS — Z888 Allergy status to other drugs, medicaments and biological substances status: Secondary | ICD-10-CM

## 2017-07-19 DIAGNOSIS — K589 Irritable bowel syndrome without diarrhea: Secondary | ICD-10-CM | POA: Diagnosis present

## 2017-07-19 DIAGNOSIS — L659 Nonscarring hair loss, unspecified: Secondary | ICD-10-CM | POA: Diagnosis present

## 2017-07-19 DIAGNOSIS — Z7952 Long term (current) use of systemic steroids: Secondary | ICD-10-CM

## 2017-07-19 DIAGNOSIS — F609 Personality disorder, unspecified: Secondary | ICD-10-CM | POA: Diagnosis present

## 2017-07-19 DIAGNOSIS — Z853 Personal history of malignant neoplasm of breast: Secondary | ICD-10-CM

## 2017-07-19 DIAGNOSIS — K210 Gastro-esophageal reflux disease with esophagitis: Secondary | ICD-10-CM | POA: Diagnosis present

## 2017-07-19 DIAGNOSIS — K219 Gastro-esophageal reflux disease without esophagitis: Secondary | ICD-10-CM | POA: Diagnosis present

## 2017-07-19 DIAGNOSIS — E785 Hyperlipidemia, unspecified: Secondary | ICD-10-CM | POA: Diagnosis present

## 2017-07-19 DIAGNOSIS — G7001 Myasthenia gravis with (acute) exacerbation: Principal | ICD-10-CM

## 2017-07-19 DIAGNOSIS — Z923 Personal history of irradiation: Secondary | ICD-10-CM | POA: Diagnosis not present

## 2017-07-19 DIAGNOSIS — E039 Hypothyroidism, unspecified: Secondary | ICD-10-CM | POA: Diagnosis present

## 2017-07-19 DIAGNOSIS — F429 Obsessive-compulsive disorder, unspecified: Secondary | ICD-10-CM | POA: Diagnosis present

## 2017-07-19 DIAGNOSIS — I10 Essential (primary) hypertension: Secondary | ICD-10-CM | POA: Diagnosis present

## 2017-07-19 DIAGNOSIS — Z884 Allergy status to anesthetic agent status: Secondary | ICD-10-CM

## 2017-07-19 DIAGNOSIS — H906 Mixed conductive and sensorineural hearing loss, bilateral: Secondary | ICD-10-CM | POA: Diagnosis present

## 2017-07-19 DIAGNOSIS — Z79899 Other long term (current) drug therapy: Secondary | ICD-10-CM

## 2017-07-19 DIAGNOSIS — M810 Age-related osteoporosis without current pathological fracture: Secondary | ICD-10-CM | POA: Diagnosis present

## 2017-07-19 DIAGNOSIS — Z7982 Long term (current) use of aspirin: Secondary | ICD-10-CM | POA: Diagnosis not present

## 2017-07-19 DIAGNOSIS — Z85828 Personal history of other malignant neoplasm of skin: Secondary | ICD-10-CM | POA: Diagnosis not present

## 2017-07-19 DIAGNOSIS — Z4502 Encounter for adjustment and management of automatic implantable cardiac defibrillator: Secondary | ICD-10-CM

## 2017-07-19 HISTORY — PX: IR FLUORO GUIDE CV LINE RIGHT: IMG2283

## 2017-07-19 HISTORY — PX: IR US GUIDE VASC ACCESS RIGHT: IMG2390

## 2017-07-19 LAB — CBC WITH DIFFERENTIAL/PLATELET
Basophils Absolute: 0 10*3/uL (ref 0.0–0.1)
Basophils Relative: 0 %
EOS ABS: 0 10*3/uL (ref 0.0–0.7)
EOS PCT: 0 %
HCT: 40.6 % (ref 36.0–46.0)
Hemoglobin: 13 g/dL (ref 12.0–15.0)
LYMPHS ABS: 0.7 10*3/uL (ref 0.7–4.0)
Lymphocytes Relative: 8 %
MCH: 30.7 pg (ref 26.0–34.0)
MCHC: 32 g/dL (ref 30.0–36.0)
MCV: 95.8 fL (ref 78.0–100.0)
MONO ABS: 0.3 10*3/uL (ref 0.1–1.0)
MONOS PCT: 3 %
NEUTROS PCT: 89 %
Neutro Abs: 8.7 10*3/uL — ABNORMAL HIGH (ref 1.7–7.7)
Platelets: 311 10*3/uL (ref 150–400)
RBC: 4.24 MIL/uL (ref 3.87–5.11)
RDW: 14.3 % (ref 11.5–15.5)
WBC: 9.7 10*3/uL (ref 4.0–10.5)

## 2017-07-19 LAB — TSH: TSH: 0.556 u[IU]/mL (ref 0.350–4.500)

## 2017-07-19 LAB — COMPREHENSIVE METABOLIC PANEL
ALK PHOS: 57 U/L (ref 38–126)
ALT: 54 U/L (ref 14–54)
ANION GAP: 14 (ref 5–15)
AST: 65 U/L — ABNORMAL HIGH (ref 15–41)
Albumin: 3.5 g/dL (ref 3.5–5.0)
BUN: 19 mg/dL (ref 6–20)
CALCIUM: 9.2 mg/dL (ref 8.9–10.3)
CO2: 21 mmol/L — ABNORMAL LOW (ref 22–32)
Chloride: 100 mmol/L — ABNORMAL LOW (ref 101–111)
Creatinine, Ser: 0.92 mg/dL (ref 0.44–1.00)
GFR calc non Af Amer: 59 mL/min — ABNORMAL LOW (ref >60)
Glucose, Bld: 98 mg/dL (ref 65–99)
Potassium: 3.7 mmol/L (ref 3.5–5.1)
SODIUM: 135 mmol/L (ref 135–145)
TOTAL PROTEIN: 7.6 g/dL (ref 6.5–8.1)
Total Bilirubin: 0.6 mg/dL (ref 0.3–1.2)

## 2017-07-19 LAB — PROTIME-INR
INR: 1.04
Prothrombin Time: 13.5 seconds (ref 11.4–15.2)

## 2017-07-19 LAB — APTT: aPTT: 26 seconds (ref 24–36)

## 2017-07-19 LAB — PHOSPHORUS: PHOSPHORUS: 3.2 mg/dL (ref 2.5–4.6)

## 2017-07-19 LAB — MAGNESIUM: Magnesium: 2.1 mg/dL (ref 1.7–2.4)

## 2017-07-19 MED ORDER — LIDOCAINE HCL 1 % IJ SOLN
INTRAMUSCULAR | Status: AC
  Filled 2017-07-19: qty 20

## 2017-07-19 MED ORDER — PREDNISONE 50 MG PO TABS
60.0000 mg | ORAL_TABLET | Freq: Every day | ORAL | Status: DC
  Administered 2017-07-20 – 2017-07-29 (×10): 60 mg via ORAL
  Filled 2017-07-19 (×5): qty 1
  Filled 2017-07-19: qty 3
  Filled 2017-07-19 (×5): qty 1

## 2017-07-19 MED ORDER — SODIUM CHLORIDE 0.9 % IV SOLN
INTRAVENOUS | Status: DC
  Administered 2017-07-20: 07:00:00 via INTRAVENOUS

## 2017-07-19 MED ORDER — ONDANSETRON HCL 4 MG PO TABS: 4.0000 mg | ORAL_TABLET | Freq: Four times a day (QID) | ORAL | Status: DC | PRN

## 2017-07-19 MED ORDER — PYRIDOSTIGMINE BROMIDE 60 MG PO TABS
60.0000 mg | ORAL_TABLET | Freq: Two times a day (BID) | ORAL | Status: DC
  Administered 2017-07-19: 60 mg via ORAL
  Filled 2017-07-19 (×2): qty 1

## 2017-07-19 MED ORDER — HYDRALAZINE HCL 20 MG/ML IJ SOLN: 10.0000 mg | Freq: Four times a day (QID) | INTRAMUSCULAR | Status: DC | PRN

## 2017-07-19 MED ORDER — ENOXAPARIN SODIUM 40 MG/0.4ML ~~LOC~~ SOLN
40.0000 mg | SUBCUTANEOUS | Status: DC
  Administered 2017-07-19 – 2017-07-28 (×10): 40 mg via SUBCUTANEOUS
  Filled 2017-07-19 (×10): qty 0.4

## 2017-07-19 MED ORDER — PYRIDOSTIGMINE BROMIDE 60 MG PO TABS
60.0000 mg | ORAL_TABLET | Freq: Two times a day (BID) | ORAL | Status: DC
  Administered 2017-07-20 – 2017-07-29 (×17): 60 mg via ORAL
  Filled 2017-07-19 (×21): qty 1

## 2017-07-19 MED ORDER — PYRIDOSTIGMINE BROMIDE 60 MG PO TABS: 60.0000 mg | ORAL_TABLET | ORAL | Status: DC

## 2017-07-19 MED ORDER — PYRIDOSTIGMINE BROMIDE 60 MG PO TABS
60.0000 mg | ORAL_TABLET | Freq: Every day | ORAL | Status: DC | PRN
  Filled 2017-07-19: qty 1

## 2017-07-19 MED ORDER — HEPARIN SODIUM (PORCINE) 1000 UNIT/ML IJ SOLN
INTRAMUSCULAR | Status: AC
  Filled 2017-07-19: qty 1

## 2017-07-19 MED ORDER — ACETAMINOPHEN 650 MG RE SUPP: 650.0000 mg | Freq: Four times a day (QID) | RECTAL | Status: DC | PRN

## 2017-07-19 MED ORDER — HEPARIN SODIUM (PORCINE) 1000 UNIT/ML IJ SOLN
INTRAMUSCULAR | Status: DC | PRN
  Administered 2017-07-19: 2.8 mL

## 2017-07-19 MED ORDER — ACETAMINOPHEN 325 MG PO TABS
650.0000 mg | ORAL_TABLET | Freq: Four times a day (QID) | ORAL | Status: DC | PRN
  Administered 2017-07-28: 650 mg via ORAL
  Filled 2017-07-19: qty 2

## 2017-07-19 MED ORDER — ONDANSETRON HCL 4 MG/2ML IJ SOLN: 4.0000 mg | Freq: Four times a day (QID) | INTRAMUSCULAR | Status: DC | PRN

## 2017-07-19 MED ORDER — LIDOCAINE HCL 1 % IJ SOLN
INTRAMUSCULAR | Status: DC | PRN
  Administered 2017-07-19: 2 mL

## 2017-07-19 NOTE — Procedures (Signed)
Pre-procedure Diagnosis: Myasthenia Gravis Post-procedure Diagnosis: Same  Successful placement of non-tunneled HD catheter with tips terminating within the superior aspect of the right atrium.    Complications: None Immediate  EBL: Minimal   The catheter is ready for immediate use.   Ronny Bacon, MD Pager #: [phone]

## 2017-07-19 NOTE — Consult Note (Signed)
Neurology Consultation Reason for Consult: Myasthenia gravis Referring Physician: Thomasenia Bottoms  CC: Myasthenia gravis  History is obtained from: Patient  HPI: Marilyn [name] is a 77 y.o. female who began having problems in October with generalized weakness.  She most noted neck drop, fatigue over upper arms and neck.  She also noted hoarseness of her voice.  She states that she always has some degree of diplopia, especially when tired, due to history of strabismus with multiple surgeries.   ROS: A 14 point ROS was performed and is negative except as noted in the HPI.   Past Medical History:  Diagnosis Date  . Abnormality of gait   . Alopecia, unspecified   . Altered mental status   . Breast cancer ([place])    stage 1; right  . Cataracts, bilateral   . Decreased rectal sphincter tone 07/01/12  . Diverticulosis   . Enterocele   . Esophageal stricture 07/01/12  . Hernia   . Hiatal hernia   . Hypertension   . Irritable bowel syndrome   . Lumbago   . Migraine with aura, without mention of intractable migraine without mention of status migrainosus   . Mixed hearing loss, bilateral   . Myasthenia gravis ([place])   . 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 Osteoarthrosis, unspecified whether generalized or localized, unspecified site   . Other and unspecified hyperlipidemia   . Plantar fascial fibromatosis   . Rectal prolapse   . Reflux esophagitis   . Regional enteritis of small intestine ([place])   . Senile osteoporosis   . Spasm of muscle   . Unspecified constipation   . Unspecified hypothyroidism      Family History  Problem Relation Age of Onset  . Hypertension Mother   . Stroke Mother   . Alzheimer's disease Father   . Hypertension Brother   . Breast cancer Maternal Aunt   . Gallbladder disease Brother   . Colon cancer Neg Hx      Social History:  reports that  has never smoked. she has never used smokeless tobacco. She reports that she does not drink alcohol or  use drugs.   Exam: Current vital signs: BP (!) 132/59 (BP Location: Right Arm)   Pulse 73   Temp 97.9 F (36.6 C) (Oral)   Resp 18   Ht 5\' 5"  (1.651 m)   Wt 70.3 kg (155 lb)   SpO2 97%   BMI 25.79 kg/m  Vital signs in last 24 hours: Temp:  [97.9 F (36.6 C)-98.6 F (37 C)] 97.9 F (36.6 C) (02/11 2103) Pulse Rate:  [73-83] 73 (02/11 2103) Resp:  [14-19] 18 (02/11 2103) BP: (132-186)/(59-87) 132/59 (02/11 2103) SpO2:  [96 %-98 %] 97 % (02/11 2103) Weight:  [70.3 kg (155 lb)] 70.3 kg (155 lb) (02/11 1017)   Physical Exam  Constitutional: Appears well-developed and well-nourished.  Psych: Affect appropriate to situation Eyes: No scleral injection HENT: No OP obstrucion Head: Normocephalic.  Cardiovascular: Normal rate and regular rhythm.  Respiratory: Effort normal, non-labored breathing GI: Soft.  No distension. There is no tenderness.  Skin: WDI  Neuro: Mental Status: Patient is awake, alert, oriented to person, place, month, year, and situation. Patient is able to give a clear and coherent history. No signs of aphasia or neglect Cranial Nerves: II: Visual Fields are full. Pupils are equal, round, and reactive to light.   III,IV, VI: EOMI without ptosis or diploplia.  V: Facial sensation is symmetric to temperature VII: Facial movement is  symmetric.  VIII: hearing is intact to voice X: Uvula elevates symmetrically XI: Shoulder shrug is symmetric. XII: tongue is midline without atrophy or fasciculations.  Motor: Tone is normal. Bulk is normal. 5/5 strength was present in all four extremities.  She has 4/5 neck extension 4+/5 neck flexion Sensory: Sensation is symmetric to light touch and temperature in the arms and legs. Deep Tendon Reflexes: 2+ and symmetric in the biceps and patellae.  Cerebellar: FNF intact bilaterally    I have reviewed labs in epic and the results pertinent to this consultation are: CMP-unremarkable  MuSK and AchR antibodies are  negative.  She had repetitive nerve stimulation done as part of an EMG which showed an abnormal decrement most consistent with a neuromuscular junction disorder.  I have reviewed the images obtained: MRI cervical spine-no cord compression  Impression: 77 year old female with what is likely seronegative myasthenia gravis as established by repetitive nerve stimulation and symptoms.  She continues to have problems after IVIG and therefore is being admitted for plasmapheresis.  Recommendations: 1) plasma exchange x5 treatments, qod 2) continue home prednisone and Mestinon 3) neurology will continue to follow  Roland Rack, MD [facility] [phone]  If 7pm- 7am, please page neurology on call as listed in [place].

## 2017-07-19 NOTE — ED Triage Notes (Signed)
States she was seen by Dr Posey Pronto 2 weeks ago and was told to "pick a day and come into the emergency department for a 7 days admission for plasm aphesis

## 2017-07-19 NOTE — ED Provider Notes (Signed)
[facility] EMERGENCY DEPARTMENT Provider Note   CSN: [id number] Arrival date & time: 07/19/17  1006     History   Chief Complaint Chief Complaint  Patient presents with  . Weakness    HPI Marilyn [name] is a 77 y.o. female.  The history is provided by the patient and medical records. No language interpreter was used.  Weakness    Marilyn [name] is a 77 y.o. female  with a PMH of myasthenia gravis who presents to the Emergency Department at recommendation of her outpatient neurologist for myasthenia gravis flare.  Main symptom is neck weakness.  She feels as if she has to hold her head up and cannot hold it up on her own accord.  Per chart review, she has tried IVIG and high-dose steroids with minimal response.  She states that her doctor told her to come to the emergency department for admission for plasmapheresis.  I spoke with Dr. Posey Pronto, her outpatient neurologist, who did indeed recommend that she get admitted for plasmapheresis due to myasthenia gravis flare that has failed outpatient treatment.  Past Medical History:  Diagnosis Date  . Abnormality of gait   . Alopecia, unspecified   . Altered mental status   . Breast cancer ([place])    stage 1; right  . Cataracts, bilateral   . Decreased rectal sphincter tone 07/01/12  . Diverticulosis   . Enterocele   . Esophageal stricture 07/01/12  . Hernia   . Hiatal hernia   . Hypertension   . IBS (irritable bowel syndrome)   . Irritable bowel syndrome   . Lumbago   . Migraine with aura, without mention of intractable migraine without mention of status migrainosus   . Mixed hearing loss, bilateral   . Myasthenia gravis ([place])   . 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 Osteoarthrosis, unspecified whether generalized or localized, unspecified site   . Other and unspecified hyperlipidemia   . Plantar fascial fibromatosis   . Rectal prolapse   . Reflux esophagitis   . Regional enteritis of small  intestine ([place])   . Senile osteoporosis   . Spasm of muscle   . Unspecified constipation   . Unspecified hypothyroidism     Patient Active Problem List   Diagnosis Date Noted  . Myasthenia gravis with (acute) exacerbation ([place]) 07/19/2017  . Unspecified hypothyroidism 07/19/2017  . Hypertension 07/19/2017  . Myasthenia gravis with acute exacerbation ([place]) 05/26/2017  . DDD (degenerative disc disease), cervical 05/06/2017  . Kyphosis 05/06/2017  . History of breast cancer in adulthood 04/12/2017  . Urine incontinence 10/07/2016  . Edema 11/06/2015  . Pain in thumb joint with movement 03/05/2015  . Essential alopecia of women 10/15/2014  . Osteopenia 03/13/2014  . Elevated anti-tissue transglutaminase (tTG) IgA level 02/28/2014  . Fecal incontinence 10/17/2013  . Hearing decreased 10/17/2013  . Lumbago 10/17/2013  . Breast cancer of upper-outer quadrant of right female breast ([place]) 03/08/2013  . Reflux esophagitis   . Hyperlipemia   . Esophageal stricture   . Decreased rectal sphincter tone 07/01/2012  . Irritant contact hand eczema 08/31/2011  . Choroidal nevus of right eye 06/10/2011  . Essential hypertension 10/29/2007  . INGUINAL HERNIA 10/29/2007  . DIVERTICULOSIS, COLON 10/29/2007  . IBS 10/29/2007  . Rectal prolapse 10/29/2007  . HEADACHE, CHRONIC 10/29/2007    Past Surgical History:  Procedure Laterality Date  . APPENDECTOMY    . BASAL CELL CARCINOMA EXCISION  1978   neck  . BREAST LUMPECTOMY  Right    snbx, apbi  . Cataract removal OD  06/28/2012   . Cataract removal OS  12/13/2012  . dermatolfibroma  2012  . DILATION AND CURETTAGE OF UTERUS    . ENTEROCELE REPAIR    . HAIR TRANSPLANT  2008  . HERNIA REPAIR    . OOPHORECTOMY    . strabisumus eye surgery     x 2  . TONSILLECTOMY      OB History    No data available       Home Medications    Prior to Admission medications   Medication Sig Start Date End Date Taking? Authorizing Provider    acetaminophen (TYLENOL) 500 MG tablet Take 500 mg by mouth every 6 (six) hours as needed for mild pain.  11/22/12  Yes Lauree Chandler, NP  aspirin 81 MG tablet Take 81 mg by mouth daily.   Yes [provider]  CALCIUM-VITAMIN D PO Take 1 capsule by mouth daily.   Yes [provider]  diphenhydrAMINE (BENADRYL) 25 MG tablet Take 12.5-25 mg by mouth at bedtime as needed for sleep.    Yes [provider]  flutamide (EULEXIN) 125 MG capsule TAKE 2 CAPSULES EVERY DAY Patient taking differently: Take 125 mg by mouth two times a day 05/29/13  Yes Estill Dooms, MD  hyoscyamine (LEVSIN SL) 0.125 MG SL tablet DISSOLVE 1 TABLET UNDER TONGUE EVERY 4-6 HOURS AS NEEDED FOR DIARRHEA 05/28/17  Yes Irene Shipper, MD  losartan (COZAAR) 50 MG tablet TAKE 1 TABLET BY MOUTH EVERY DAY Patient taking differently: Take 50 mg by mouth once a day 03/30/17  Yes Reed, Tiffany L, DO  minoxidil (LONITEN) 2.5 MG tablet 1.25mg  by mouth daily 03/08/17  Yes [provider]  Multiple Vitamins-Minerals (MULTIVITAMIN & MINERAL PO) Take 1 tablet by mouth daily.   Yes [provider]  mupirocin ointment (BACTROBAN) 2 % APPLY TO AREA ON RIGHT LOWER LEG DAILY UNTIL HEALED, COVER WITH BANDAID 06/25/17  Yes [provider]  pantoprazole (PROTONIX) 40 MG tablet TAKE 1 TABLET (40 MG TOTAL) BY MOUTH EVERY OTHER DAY. 01/29/17  Yes Irene Shipper, MD  predniSONE (DELTASONE) 20 MG tablet Take 3 tablets (60 mg total) by mouth daily. 06/18/17  Yes Patel, Arvin Collard K, DO  Probiotic Product (PROBIOTIC PO) Take 1 tablet by mouth daily.   Yes [provider]  pyridostigmine (MESTINON) 60 MG tablet Take at 9am, 2pm, and 7pm. Patient taking differently: Take 60 mg by mouth See admin instructions. 60mg  by mouth twice daily - may take additional 60mg  at 7pm if needed for energy 05/26/17  Yes Patel, Donika K, DO  traMADol (ULTRAM) 50 MG tablet Take 1 tablet (50 mg total) daily as needed by mouth  (severe neck pain). Patient taking differently: Take 50 mg by mouth daily as needed (for severe neck pain).  04/12/17  Yes Reed, Tiffany L, DO  vitamin C (ASCORBIC ACID) 500 MG tablet Take 500 mg by mouth daily.   Yes [provider]  Wheat Dextrin-Calcium ([employer] EASY FIBER/CALCIUM PO) Take 1 tablet by mouth daily.    Yes [provider]  methocarbamol (ROBAXIN) 500 MG tablet Take 1 tablet (500 mg total) by mouth at bedtime as needed for muscle spasms. Patient not taking: Reported on 07/19/2017 05/06/17   Gayland Curry, DO    Family History Family History  Problem Relation Age of Onset  . Hypertension Mother   . Stroke Mother   . Alzheimer's disease  Father   . Hypertension Brother   . Breast cancer Maternal Aunt   . Gallbladder disease Brother   . Colon cancer Neg Hx     Social History Social History   Tobacco Use  . Smoking status: Never Smoker  . Smokeless tobacco: Never Used  Substance Use Topics  . Alcohol use: No  . Drug use: No     Allergies   Gabapentin and Cetacaine [butamben-tetracaine-benzocaine]   Review of Systems Review of Systems  Neurological: Positive for weakness.  All other systems reviewed and are negative.    Physical Exam Updated Vital Signs BP (!) 186/75   Pulse 83   Temp 98.4 F (36.9 C) (Oral)   Resp 18   Ht 5\' 5"  (1.651 m)   Wt 70.3 kg (155 lb)   SpO2 98%   BMI 25.79 kg/m   Physical Exam  Constitutional: She is oriented to person, place, and time. She appears well-developed and well-nourished. No distress.  HENT:  Head: Normocephalic and atraumatic.  Neck:  Neck with no swelling. Airway patent. Weakness of neck muscles.  Cardiovascular: Normal rate, regular rhythm and normal heart sounds.  No murmur heard. Pulmonary/Chest: Effort normal and breath sounds normal. No respiratory distress.  Neurological: She is alert and oriented to person, place, and time.  Skin: Skin is warm and dry.  Nursing note and vitals  reviewed.    ED Treatments / Results  Labs (all labs ordered are listed, but only abnormal results are displayed) Labs Reviewed  CBC WITH DIFFERENTIAL/PLATELET  COMPREHENSIVE METABOLIC PANEL  PROTIME-INR  APTT  TSH    EKG  EKG Interpretation None       Radiology No results found.  Procedures Procedures (including critical care time)  Medications Ordered in ED Medications  predniSONE (DELTASONE) tablet 60 mg (not administered)     Initial Impression / Assessment and Plan / ED Course  I have reviewed the triage vital signs and the nursing notes.  Pertinent labs & imaging results that were available during my care of the patient were reviewed by me and considered in my medical decision making (see chart for details).    Marilyn [name] is a 77 y.o. female who presents to ED at recommendation of outpatient neurologist, Dr. Posey Pronto, for admission to proceed with plasmapheresis for myasthenia gravis flare. She has tried IVIG and high dose steroids as outpatient with minimal improvement. Neurology, Dr. Cheral Marker, consulted who recommends medical admission and neuro to see in consultation. He will put in full recommendations for MG flare. Hospitalist consulted who will admit.   Final Clinical Impressions(s) / ED Diagnoses   Final diagnoses:  Myasthenia gravis with (acute) exacerbation [facility])    ED Discharge Orders    None       Olamae Ferrara, Ozella Almond, PA-C 07/19/17 1206    Jola Schmidt, MD 07/19/17 1656

## 2017-07-19 NOTE — Progress Notes (Signed)
Patient arrived to 6N20, A&Ox4, VSS, IV intact and infusing.  Noted to have substantial neck droop present on left side.  Skin is otherwise intact.  No family at bedside, patient says family coming to bring luggage later as she is expecting a 6-7 day stay.  Patient oriented to room and equipment.  Will continue to monitor.

## 2017-07-19 NOTE — H&P (Signed)
History and Physical    Marilyn [name] VWU:[id number] DOB: 22-Jul-1940 DOA: 07/19/2017  **Will admit patient based on the expectation that the patient will need hospitalization/ hospital care that crosses at least 2 midnights  PCP: Gayland Curry, DO   Attending physician: Lorin Mercy  Patient coming from/Resides with: Private residence  Chief Complaint: Myasthenia gravis crisis failed outpatient therapies  HPI: Marilyn [name] is a 77 y.o. female with medical history significant for breast cancer 11 years ago status post lumpectomy and radiation, OCD, hypothyroidism, hypertension, GERD and irritable bowel.  Patient with newly diagnosed myasthenia gravis.  Began developing symptoms in November and was referred to neurologist.  High-dose prednisone 60 mg daily with IVIG attempted at the end of January but neurological symptoms have not improved.  Neurologist has referred patient for inpatient admission to begin plasmapheresis treatment.  Patient's primary symptoms are related to involuntary head drop, difficulty focusing with vision and upper extremity weakness as evidenced by difficulty managing spoon/fine motor issues.  ED Course:  Vital Signs: BP (!) 186/75   Pulse 83   Temp 98.4 F (36.9 C) (Oral)   Resp 18   Ht 5' 5"  (1.651 m)   Wt 70.3 kg (155 lb)   SpO2 98%   BMI 25.79 kg/m  Lab data: No labs ordered in ER; labs from 1/29 reveal a normal CK, CRP, protein electrophoresis with gamma globulin 2.8 and IgG 2946 (obtain after infusion of IVIG the week before), ESR 36, coags were normal, hemoglobin A1c 5.7, angiotensin converting enzyme normal, no electrolytes were obtained at that visit.  Review of Systems:  In addition to the HPI above,  No Fever-chills, myalgias or other constitutional symptoms No Headache, changes with hearing, tingling, numbness in any extremity, dizziness, dysarthria or word finding difficulty, tremors or seizure activity No problems swallowing food or Liquids,  indigestion/reflux, choking or coughing while eating, abdominal pain with or after eating No Chest pain, Cough or Shortness of Breath, palpitations, orthopnea or DOE No Abdominal pain, N/V, melena,hematochezia, dark tarry stools, constipation No dysuria, malodorous urine, hematuria or flank pain No new skin rashes, lesions, masses or bruises, No new joint pains, aches, swelling or redness No recent unintentional weight gain or loss No polyuria, polydypsia or polyphagia   Past Medical History:  Diagnosis Date  . Abnormality of gait   . Alopecia, unspecified   . Altered mental status   . Breast cancer ([place])    stage 1; right  . Cataracts, bilateral   . Decreased rectal sphincter tone 07/01/12  . Diverticulosis   . Enterocele   . Esophageal stricture 07/01/12  . Hernia   . Hiatal hernia   . Hypertension   . IBS (irritable bowel syndrome)   . Irritable bowel syndrome   . Lumbago   . Migraine with aura, without mention of intractable migraine without mention of status migrainosus   . Mixed hearing loss, bilateral   . Myasthenia gravis ([place])   . 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 Osteoarthrosis, unspecified whether generalized or localized, unspecified site   . Other and unspecified hyperlipidemia   . Plantar fascial fibromatosis   . Rectal prolapse   . Reflux esophagitis   . Regional enteritis of small intestine ([place])   . Senile osteoporosis   . Spasm of muscle   . Unspecified constipation   . Unspecified hypothyroidism     Past Surgical History:  Procedure Laterality Date  . APPENDECTOMY    . BASAL CELL CARCINOMA EXCISION  1978   neck  . BREAST LUMPECTOMY Right    snbx, apbi  . Cataract removal OD  06/28/2012   . Cataract removal OS  12/13/2012  . dermatolfibroma  2012  . DILATION AND CURETTAGE OF UTERUS    . ENTEROCELE REPAIR    . HAIR TRANSPLANT  2008  . HERNIA REPAIR    . OOPHORECTOMY    . strabisumus eye surgery     x 2  . TONSILLECTOMY       Social History   Socioeconomic History  . Marital status: Single    Spouse name: Not on file  . Number of children: 0  . Years of education: 85  . Highest education level: Not on file  Social Needs  . Financial resource strain: Not on file  . Food insecurity - worry: Not on file  . Food insecurity - inability: Not on file  . Transportation needs - medical: Not on file  . Transportation needs - non-medical: Not on file  Occupational History  . Occupation: retired Economist  Tobacco Use  . Smoking status: Never Smoker  . Smokeless tobacco: Never Used  Substance and Sexual Activity  . Alcohol use: No  . Drug use: No  . Sexual activity: No  Other Topics Concern  . Not on file  Social History Narrative   Lives alone in a 2 story home.  Has 2 dogs, no children.     Retired Economist.     Education: Oceanographer.      Mobility: Recently began utilizing a cane Work history: Not obtained   Allergies  Allergen Reactions  . Gabapentin Other (See Comments)    Balance disturbance  . Cetacaine [Butamben-Tetracaine-Benzocaine] Other (See Comments)    LOST HER SENSE OF TASTE FOR OVER A MONTH!!!    Family History  Problem Relation Age of Onset  . Hypertension Mother   . Stroke Mother   . Alzheimer's disease Father   . Hypertension Brother   . Breast cancer Maternal Aunt   . Gallbladder disease Brother   . Colon cancer Neg Hx      Prior to Admission medications   Medication Sig Start Date End Date Taking? Authorizing Provider  acetaminophen (TYLENOL) 500 MG tablet Take 500 mg by mouth every 6 (six) hours as needed for mild pain.  11/22/12  Yes Lauree Chandler, NP  aspirin 81 MG tablet Take 81 mg by mouth daily.   Yes [provider]  CALCIUM-VITAMIN D PO Take 1 capsule by mouth daily.   Yes [provider]  diphenhydrAMINE (BENADRYL) 25 MG tablet Take 12.5-25 mg by mouth at bedtime as needed for sleep.    Yes [provider]  flutamide (EULEXIN) 125 MG capsule TAKE 2 CAPSULES EVERY DAY Patient taking differently: Take 125 mg by mouth two times a day 05/29/13  Yes Estill Dooms, MD  hyoscyamine (LEVSIN SL) 0.125 MG SL tablet DISSOLVE 1 TABLET UNDER TONGUE EVERY 4-6 HOURS AS NEEDED FOR DIARRHEA 05/28/17  Yes Irene Shipper, MD  losartan (COZAAR) 50 MG tablet TAKE 1 TABLET BY MOUTH EVERY DAY Patient taking differently: Take 50 mg by mouth once a day 03/30/17  Yes Reed, Tiffany L, DO  minoxidil (LONITEN) 2.5 MG tablet 1.69m by mouth daily 03/08/17  Yes [provider]  Multiple Vitamins-Minerals (MULTIVITAMIN & MINERAL PO) Take 1 tablet by mouth daily.   Yes [provider]  mupirocin ointment (BACTROBAN) 2 % APPLY TO AREA ON  RIGHT LOWER LEG DAILY UNTIL HEALED, COVER WITH BANDAID 06/25/17  Yes [provider]  Neomycin-Bacitracin-Polymyxin (HCA TRIPLE ANTIBIOTIC OINTMENT EX) Apply 1 application topically daily as needed (irritation to nares).   Yes [provider]  pantoprazole (PROTONIX) 40 MG tablet TAKE 1 TABLET (40 MG TOTAL) BY MOUTH EVERY OTHER DAY. 01/29/17  Yes Irene Shipper, MD  predniSONE (DELTASONE) 20 MG tablet Take 3 tablets (60 mg total) by mouth daily. 06/18/17  Yes Patel, Arvin Collard K, DO  Probiotic Product (PROBIOTIC PO) Take 1 tablet by mouth daily.   Yes [provider]  pyridostigmine (MESTINON) 60 MG tablet Take at 9am, 2pm, and 7pm. Patient taking differently: Take 60 mg by mouth See admin instructions. 62m by mouth twice daily - may take additional 659mat 7pm if needed for energy 05/26/17  Yes Patel, Donika K, DO  traMADol (ULTRAM) 50 MG tablet Take 1 tablet (50 mg total) daily as needed by mouth (severe neck pain). Patient taking differently: Take 50 mg by mouth daily as needed (for severe neck pain).  04/12/17  Yes Reed, Tiffany L, DO  vitamin C (ASCORBIC ACID) 500 MG tablet Take 500 mg by mouth daily.   Yes [provider]  Wheat  Dextrin-Calcium ([employer] EASY FIBER/CALCIUM PO) Take 1 tablet by mouth daily.    Yes [provider]  methocarbamol (ROBAXIN) 500 MG tablet Take 1 tablet (500 mg total) by mouth at bedtime as needed for muscle spasms. Patient not taking: Reported on 07/19/2017 05/06/17   ReGayland CurryDO    Physical Exam: Vitals:   07/19/17 1015 07/19/17 1017  BP: (!) 186/75   Pulse: 83   Resp: 18   Temp: 98.4 F (36.9 C)   TempSrc: Oral   SpO2: 98%   Weight:  70.3 kg (155 lb)  Height:  5' 5"  (1.651 m)      Constitutional: NAD, calm, comfortable Eyes: PERRL, lids and conjunctivae normal-no lid lag noted ENMT: Mucous membranes are moist. Posterior pharynx clear of any exudate or lesions.Normal dentition.  Neck: normal, supple, no masses, no thyromegaly Respiratory: clear to auscultation bilaterally, no wheezing, no crackles. Normal respiratory effort. No accessory muscle use.  Cardiovascular: Regular rate and rhythm, no murmurs / rubs / gallops. No extremity edema. 2+ pedal pulses. No carotid bruits.  Abdomen: no tenderness, no masses palpated. No hepatosplenomegaly. Bowel sounds positive.  Musculoskeletal: no clubbing / cyanosis. No joint deformity upper and lower extremities. Good ROM, no contractures. Normal muscle tone.  Skin: no rashes, lesions, ulcers. No induration Neurologic: CN 2-12 grossly intact-visual acuity not tested. Sensation intact, DTR normal. Strength 5/5 x all 4 extremities.  No overt weakness involving head and neck while patient seated with back supported but once patient repositioned to standing to mobilize noted that chin/head immediately dropped forward with a right lateral tilt to head.  No difficulty with ambulating although patient does report that if she bends forward she becomes unsteady and has to rebalance herself by placing her hands in front of her and then utilizes stationary items to assist herself back to upright position. Psychiatric: Normal judgment and  insight. Alert and oriented x 3. Normal mood.    Labs on Admission: I have personally reviewed following labs and imaging studies  CBC: Recent Labs  Lab 07/19/17 1150  WBC 9.7  NEUTROABS 8.7*  HGB 13.0  HCT 40.6  MCV 95.8  PLT 31448 Basic Metabolic Panel: No results for input(s): NA, K, CL, CO2, GLUCOSE, BUN,  CREATININE, CALCIUM, MG, PHOS in the last 168 hours. GFR: CrCl cannot be calculated (Patient's most recent lab result is older than the maximum 21 days allowed.). Liver Function Tests: No results for input(s): AST, ALT, ALKPHOS, BILITOT, PROT, ALBUMIN in the last 168 hours. No results for input(s): LIPASE, AMYLASE in the last 168 hours. No results for input(s): AMMONIA in the last 168 hours. Coagulation Profile: Recent Labs  Lab 07/19/17 1150  INR 1.04   Cardiac Enzymes: No results for input(s): CKTOTAL, CKMB, CKMBINDEX, TROPONINI in the last 168 hours. BNP (last 3 results) No results for input(s): PROBNP in the last 8760 hours. HbA1C: No results for input(s): HGBA1C in the last 72 hours. CBG: No results for input(s): GLUCAP in the last 168 hours. Lipid Profile: No results for input(s): CHOL, HDL, LDLCALC, TRIG, CHOLHDL, LDLDIRECT in the last 72 hours. Thyroid Function Tests: No results for input(s): TSH, T4TOTAL, FREET4, T3FREE, THYROIDAB in the last 72 hours. Anemia Panel: No results for input(s): VITAMINB12, FOLATE, FERRITIN, TIBC, IRON, RETICCTPCT in the last 72 hours. Urine analysis: No results found for: COLORURINE, APPEARANCEUR, LABSPEC, PHURINE, GLUCOSEU, HGBUR, BILIRUBINUR, KETONESUR, PROTEINUR, UROBILINOGEN, NITRITE, LEUKOCYTESUR Sepsis Labs: @LABRCNTIP (procalcitonin:4,lacticidven:4) )No results found for this or any previous visit (from the past 240 hour(s)).   Radiological Exams on Admission: No results found.   Assessment/Plan Principal Problem:   Myasthenia gravis with (acute) exacerbation  -Patient presents with recently diagnosed  myasthenia gravis with primary symptoms of decreased ability to visually focus, chin/head weakness (unable to support weight of head independently when back not supported) and to a lesser extent upper extremity weakness/difficulty managing eating utensils-patient denies difficulty breathing, choking or coughing with eating or falls -She has failed outpatient steroids and IVIG with neurologist recommending initiation of 5-7-day course of plasmapheresis -Neurological checks every 4 hours -Formal neurological consultation/ inpatient pending -Obtain baseline labs: CMET, CBC, PT/PTT -IR consulted for placement of dialysis catheter to initiate plasmapheresis therapies -Continue prednisone 60 mg daily-dosage adjustments/tapering dosage at recommendation of allergy -Continue Mestinon (with sip of water while waiting for dialysis catheter placement)-patient reports takes Mestinon every 12 hours with an additional dose as needed at 7 PM "for energy" -NPO until timing of dialysis catheter placement determined-normal saline IVFs until diet resumed -PT/OT evaluation-patient may benefit from adaptive devices for use at home noting patient reports difficulty managing utensils while eating  Active Problems:   Hypertension -Somewhat uncontrolled -IV hydralazine prn while patient NPO -Please order home antihypertensives after diet resumed    Unspecified hypothyroidism -TSH -Was not on Synthroid prior to admission      DVT prophylaxis: Lovenox with first dose ordered at 2200 on date of admission pending timing of dialysis catheter placement Code Status: Full Family Communication: No family or friends at bedside Disposition Plan: Home Consults called: Neurology/[place]; Interventional Radiology    Samella Parr ANP-BC [facility] Pager [phone]   If 7PM-7AM, please contact night-coverage [email or web address] Password [facility]  07/19/2017, 12:40 PM

## 2017-07-20 ENCOUNTER — Other Ambulatory Visit: Payer: Self-pay

## 2017-07-20 LAB — CBC
HEMATOCRIT: 37.1 % (ref 36.0–46.0)
HEMATOCRIT: 39.1 % (ref 36.0–46.0)
HEMOGLOBIN: 11.8 g/dL — AB (ref 12.0–15.0)
HEMOGLOBIN: 12.5 g/dL (ref 12.0–15.0)
MCH: 30.5 pg (ref 26.0–34.0)
MCH: 30.6 pg (ref 26.0–34.0)
MCHC: 31.8 g/dL (ref 30.0–36.0)
MCHC: 32 g/dL (ref 30.0–36.0)
MCV: 95.9 fL (ref 78.0–100.0)
MCV: 95.6 fL (ref 78.0–100.0)
Platelets: 236 10*3/uL (ref 150–400)
Platelets: 265 10*3/uL (ref 150–400)
RBC: 3.87 MIL/uL (ref 3.87–5.11)
RBC: 4.09 MIL/uL (ref 3.87–5.11)
RDW: 14.3 % (ref 11.5–15.5)
RDW: 14.4 % (ref 11.5–15.5)
WBC: 6.1 10*3/uL (ref 4.0–10.5)
WBC: 6.9 10*3/uL (ref 4.0–10.5)

## 2017-07-20 LAB — COMPREHENSIVE METABOLIC PANEL
ALBUMIN: 2.8 g/dL — AB (ref 3.5–5.0)
ALT: 46 U/L (ref 14–54)
ANION GAP: 11 (ref 5–15)
AST: 57 U/L — ABNORMAL HIGH (ref 15–41)
Alkaline Phosphatase: 47 U/L (ref 38–126)
BILIRUBIN TOTAL: 0.6 mg/dL (ref 0.3–1.2)
BUN: 15 mg/dL (ref 6–20)
CO2: 23 mmol/L (ref 22–32)
Calcium: 8.6 mg/dL — ABNORMAL LOW (ref 8.9–10.3)
Chloride: 106 mmol/L (ref 101–111)
Creatinine, Ser: 0.98 mg/dL (ref 0.44–1.00)
GFR calc non Af Amer: 54 mL/min — ABNORMAL LOW (ref >60)
GLUCOSE: 81 mg/dL (ref 65–99)
POTASSIUM: 3.6 mmol/L (ref 3.5–5.1)
SODIUM: 140 mmol/L (ref 135–145)
TOTAL PROTEIN: 5.9 g/dL — AB (ref 6.5–8.1)

## 2017-07-20 LAB — TYPE AND SCREEN
ABO/RH(D): O NEG
ANTIBODY SCREEN: NEGATIVE

## 2017-07-20 LAB — ABO/RH: ABO/RH(D): O NEG

## 2017-07-20 MED ORDER — FLUTAMIDE 125 MG PO CAPS
125.0000 mg | ORAL_CAPSULE | Freq: Two times a day (BID) | ORAL | Status: DC
  Administered 2017-07-20 – 2017-07-29 (×19): 125 mg via ORAL
  Filled 2017-07-20 (×19): qty 1

## 2017-07-20 MED ORDER — SODIUM CHLORIDE 0.9 % IV SOLN
2.0000 g | INTRAVENOUS | Status: AC
  Administered 2017-07-20: 2 g via INTRAVENOUS
  Filled 2017-07-20: qty 20

## 2017-07-20 MED ORDER — ACD FORMULA A 0.73-2.45-2.2 GM/100ML VI SOLN
500.0000 mL | Status: DC
  Filled 2017-07-20: qty 500

## 2017-07-20 MED ORDER — SODIUM CHLORIDE 0.9 % IV SOLN: Freq: Once | INTRAVENOUS | Status: DC

## 2017-07-20 MED ORDER — DIPHENHYDRAMINE HCL 25 MG PO CAPS: 25.0000 mg | ORAL_CAPSULE | Freq: Four times a day (QID) | ORAL | Status: DC | PRN

## 2017-07-20 MED ORDER — HEPARIN SODIUM (PORCINE) 1000 UNIT/ML IJ SOLN: 1000.0000 [IU] | Freq: Once | INTRAMUSCULAR | Status: DC

## 2017-07-20 MED ORDER — CALCIUM CARBONATE ANTACID 500 MG PO CHEW
CHEWABLE_TABLET | ORAL | Status: AC
  Filled 2017-07-20: qty 2

## 2017-07-20 MED ORDER — ACETAMINOPHEN 325 MG PO TABS: 650.0000 mg | ORAL_TABLET | ORAL | Status: DC | PRN

## 2017-07-20 MED ORDER — ACD FORMULA A 0.73-2.45-2.2 GM/100ML VI SOLN
500.0000 mL | Status: AC
  Administered 2017-07-20: 500 mL via INTRAVENOUS
  Filled 2017-07-20: qty 500

## 2017-07-20 MED ORDER — LOSARTAN POTASSIUM 50 MG PO TABS
50.0000 mg | ORAL_TABLET | Freq: Every day | ORAL | Status: DC
  Administered 2017-07-20 – 2017-07-28 (×9): 50 mg via ORAL
  Filled 2017-07-20 (×10): qty 1

## 2017-07-20 MED ORDER — SODIUM CHLORIDE 0.9 % IV SOLN
2.0000 g | Freq: Once | INTRAVENOUS | Status: DC
  Filled 2017-07-20: qty 20

## 2017-07-20 MED ORDER — DIPHENHYDRAMINE HCL 25 MG PO CAPS
25.0000 mg | ORAL_CAPSULE | Freq: Four times a day (QID) | ORAL | Status: DC | PRN
  Filled 2017-07-20: qty 1

## 2017-07-20 MED ORDER — RISAQUAD PO CAPS
1.0000 | ORAL_CAPSULE | Freq: Every day | ORAL | Status: DC
  Administered 2017-07-20 – 2017-07-29 (×10): 1 via ORAL
  Filled 2017-07-20 (×10): qty 1

## 2017-07-20 MED ORDER — CALCIUM CARBONATE ANTACID 500 MG PO CHEW
2.0000 | CHEWABLE_TABLET | ORAL | Status: AC
  Administered 2017-07-20: 400 mg via ORAL

## 2017-07-20 MED ORDER — MINOXIDIL 2.5 MG PO TABS
2.5000 mg | ORAL_TABLET | Freq: Every day | ORAL | Status: DC
  Administered 2017-07-20 – 2017-07-28 (×9): 2.5 mg via ORAL
  Filled 2017-07-20 (×10): qty 1

## 2017-07-20 MED ORDER — VITAMIN C 500 MG PO TABS
500.0000 mg | ORAL_TABLET | Freq: Every day | ORAL | Status: DC
  Administered 2017-07-20 – 2017-07-29 (×10): 500 mg via ORAL
  Filled 2017-07-20 (×10): qty 1

## 2017-07-20 MED ORDER — SODIUM CHLORIDE 0.9 % IV SOLN
INTRAVENOUS | Status: AC
  Administered 2017-07-20 (×3): via INTRAVENOUS_CENTRAL
  Filled 2017-07-20 (×3): qty 200

## 2017-07-20 MED ORDER — CALCIUM CARBONATE ANTACID 500 MG PO CHEW: 2.0000 | CHEWABLE_TABLET | ORAL | Status: DC

## 2017-07-20 NOTE — Evaluation (Signed)
Occupational Therapy Evaluation Patient Details Name: Marilyn [name] MRN: [id number] DOB: 14-Mar-1941 Today's Date: 07/20/2017    History of Present Illness 77 y.o. female admitted on 07/19/17 for progressive weakness associated with myesthenia gravis that was not responding to IVIG tx.  She is here acutely to receive plasmaphoresis treatments.  Pt with significant PMH of   OCD, cervical spine arthritis, HOH, migraine, low back pain, HTN, esophageal stricture, and breast CA R s/p lumpectomy.   Clinical Impression   PTA, pt reports difficulty with self-feeding with spilling noted when attempting to utilize a spoon. She has been driving and completing ADL with occasional use of cane but cervical and proximal shoulder stabilizers fatigue easily limiting her independence. Pt currently requires overall supervision for basic ADL and ADL transfers. She demonstrates poor motor control to reach for objects related to self-care and home management tasks. Pt additionally has a significant history of visual deficits but denies double vision at this time. She does have decreased smoothness of tracking and decreased convergence/divergence with R eye. Functionally, she has trouble reading words on the television but is able to read her menu. Pt would benefit from continued OT services while admitted to improve independence with desired IADL tasks and to improve motor control for self feeding tasks. Recommend follow-up with outpatient OT post-acute D/C.  Attempted return for second visit to discuss potential cervical support to improve functional positioning; however, pt off unit for plasmaphoresis treatment. Will return next date.     Follow Up Recommendations  Outpatient OT    Equipment Recommendations  None recommended by OT    Recommendations for Other Services       Precautions / Restrictions Precautions Precautions: None Required Braces or Orthoses: Other Brace/Splint Other Brace/Splint: Pt wears  varying degrees of cervical support braces to help with head and neck control. Pt has Aspen collar that does not provide functional positioning.   Restrictions Weight Bearing Restrictions: No      Mobility Bed Mobility Overal bed mobility: Independent                Transfers Overall transfer level: Independent Equipment used: None                  Balance Overall balance assessment: Mild deficits observed, not formally tested                               Standardized Balance Assessment Standardized Balance Assessment : Dynamic Gait Index   Dynamic Gait Index Level Surface: Normal Change in Gait Speed: Mild Impairment Gait with Horizontal Head Turns: Mild Impairment(pt has difficulty turning her head due to strength) Gait with Vertical Head Turns: Mild Impairment(pt had difficulty looking up due to cervical strength defici) Gait and Pivot Turn: Normal Step Over Obstacle: Mild Impairment Step Around Obstacles: Normal Steps: Mild Impairment Total Score: 19     ADL either performed or assessed with clinical judgement   ADL Overall ADL's : Needs assistance/impaired Eating/Feeding: Sitting;Supervision/ safety Eating/Feeding Details (indicate cue type and reason): Spills when utilizing spoon due to poor motor control. Decreased tongue strength for lateralization.  Grooming: Supervision/safety;Sitting   Upper Body Bathing: Supervision/ safety;Sitting   Lower Body Bathing: Supervison/ safety;Sit to/from stand   Upper Body Dressing : Supervision/safety;Sitting   Lower Body Dressing: Supervision/safety;Sit to/from stand   Toilet Transfer: Supervision/safety;Ambulation   Toileting- Clothing Manipulation and Hygiene: Supervision/safety;Sit to/from stand       Functional mobility  during ADLs: Supervision/safety General ADL Comments: Poor ability to complete slow and controlled movements and fatigues easily. Noted significant compensatory movement  strategies to maintain functional head position. Greatest limitation is in her ability to complete IADL and home management.      Vision Patient Visual Report: (history of multiple surgeries for strabismus) Vision Assessment?: Yes Eye Alignment: Within Functional Limits Ocular Range of Motion: Within Functional Limits Alignment/Gaze Preference: Within Defined Limits Tracking/Visual Pursuits: Decreased smoothness of horizontal tracking;Decreased smoothness of vertical tracking Saccades: Decreased speed of saccadic movement;Overshoots(overshoots to the L) Convergence: Impaired - to be further tested in functional context(R eye does not converge at same rate as L) Visual Fields: (blurry in L quadrant) Diplopia Assessment: (denies) Additional Comments: Pt with history of visual deficits and multiple surgeries for both strabismus and cataracts. Long history of double vision but denies on evaluation. Reports greatest difficulty with reading on TV. Utilizing magnifier to read small font. Noted decreased convergence/divergence.      Perception     Praxis      Pertinent Vitals/Pain Pain Assessment: No/denies pain     Hand Dominance Right   Extremity/Trunk Assessment Upper Extremity Assessment Upper Extremity Assessment: RUE deficits/detail;LUE deficits/detail RUE Deficits / Details: Proximally weak with poor motor control. More functional with quick movement.  LUE Deficits / Details: Proximally weak with poor motor control. Does best with quick movement.    Lower Extremity Assessment Lower Extremity Assessment: Defer to PT evaluation RLE Deficits / Details: bil hip flexors 4/5, knee 5/5, ankle 5/5 LLE Deficits / Details: bil hip flexors 4/5, knee 5/5, ankle 5/5   Cervical / Trunk Assessment Cervical / Trunk Assessment: Other exceptions Cervical / Trunk Exceptions: forward head with decreased muscle strength for upright posture against gravity.   Communication  Communication Communication: HOH   Cognition Arousal/Alertness: Awake/alert Behavior During Therapy: WFL for tasks assessed/performed Overall Cognitive Status: Within Functional Limits for tasks assessed                                     General Comments       Exercises     Shoulder Instructions      Home Living Family/patient expects to be discharged to:: Private residence Living Arrangements: Alone Available Help at Discharge: Other (Comment)(sister is visiting but lives in [place]) Type of Home: [place]: Two level;1/2 bath on main level(sleeps main level; shower is upstairs) Alternate Level Stairs-Number of Steps: flight(goes upstairs only for showering)   Bathroom Shower/Tub: Occupational psychologist: Standard         Additional Comments: Has been staying on main level      Prior Functioning/Environment Level of Independence: Independent        Comments: Has a cane that she uses only sometimes, drives, enjoys bird watching and has 2 small dogs        OT Problem List: Decreased strength;Decreased activity tolerance;Impaired balance (sitting and/or standing);Decreased safety awareness;Decreased knowledge of use of DME or AE;Decreased knowledge of precautions;Impaired UE functional use      OT Treatment/Interventions: Self-care/ADL training;Therapeutic exercise;Energy conservation;DME and/or AE instruction;Therapeutic activities;Balance training;Neuromuscular education;Splinting;Cognitive remediation/compensation;Visual/perceptual remediation/compensation;Patient/family education;Manual therapy;Modalities    OT Goals(Current goals can be found in the care plan section) Acute Rehab OT Goals Patient Stated Goal: back to feeding birds OT Goal Formulation: With patient/family Time For Goal Achievement: 08/03/17 Potential to Achieve Goals: Good  ADL Goals Pt Will Perform Eating: Independently;with adaptive  utensils;sitting Pt Will Perform Grooming: Independently;standing Pt/caregiver will Perform Home Exercise Program: Both right and left upper extremity;With written HEP provided;With Supervision;Increased strength(increased proximal strength and stability) Additional ADL Goal #1: Pt will demonstrate improved motor control to pour herself glass of water from moderately full pitcher with no spilling. Additional ADL Goal #2: Pt will independently maintain proper cervical positioning with external supports during standing ADL/IADL tasks.  OT Frequency: Min 2X/week   Barriers to D/C:            Co-evaluation              AM-PAC PT "6 Clicks" Daily Activity     Outcome Measure Help from another person eating meals?: A Little Help from another person taking care of personal grooming?: A Little Help from another person toileting, which includes using toliet, bedpan, or urinal?: A Little Help from another person bathing (including washing, rinsing, drying)?: A Little Help from another person to put on and taking off regular upper body clothing?: A Little Help from another person to put on and taking off regular lower body clothing?: A Little 6 Click Score: 18   End of Session Equipment Utilized During Treatment: Gait belt Nurse Communication: Mobility status  Activity Tolerance: Patient tolerated treatment well Patient left: in chair;with family/visitor present;with call bell/phone within reach  OT Visit Diagnosis: Other abnormalities of gait and mobility (R26.89);Muscle weakness (generalized) (M62.81)                Time: 3383-2919 OT Time Calculation (min): 77 min Charges:  OT General Charges $OT Visit: 1 Visit OT Evaluation $OT Eval Moderate Complexity: 1 Mod OT Treatments $Self Care/Home Management : 53-67 mins G-Codes:     Norman Herrlich, MS OTR/L  Pager: [place] [facility] [place] 07/20/2017, 4:33 PM

## 2017-07-20 NOTE — Progress Notes (Signed)
PROGRESS NOTE    Marilyn [name]   QMK:[id number]  DOB: April 26, 1941  DOA: 07/19/2017 PCP: Gayland Curry, DO   Brief Narrative:  Marilyn [name] is a 77 y.o. female with medical history significant for breast cancer 11 years ago status post lumpectomy and radiation, OCD, hypothyroidism, hypertension, GERD and irritable bowel.  She has a new diagnosis of myasthenia gravis and has been on Prednisone with IVIG being given in the end of January without improvement in neurological symptoms. She is referred as a direct admit to start Plasmapheresis as inpatient.  Presenting complaints include drooping of her head, poor visual acuity, diplopia and upper extremity weakness.    Subjective: She feels weakness of her neck today. No double vision or blurred vision. Arms are not weak today.  ROS: no complaints of nausea, vomiting, constipation diarrhea, cough, dyspnea or dysuria. No other complaints.   Assessment & Plan:   Principal Problem:   Myasthenia gravis with (acute) exacerbation  - HD temp cath placed last night - Neuro has consulted and ordered the plasmapheresis for 5 treatments every other day - cont Prednisone and Mestinon  Active Problems:     Hypertension - she takes Cozaar for this  Hair loss - Minoxidil, Flutamide  DVT prophylaxis: Lovenox Code Status: Full code Family Communication: Full code Disposition Plan:  Consultants:   neurology Procedures:   Temp dialysis cath Antimicrobials:  Anti-infectives (From admission, onward)   None       Objective: Vitals:   07/19/17 1338 07/19/17 2103 07/20/17 0526 07/20/17 0634  BP: (!) 163/66 (!) 132/59 (!) 132/59   Pulse: 74 73 70   Resp: 19 18 18    Temp: 98.6 F (37 C) 97.9 F (36.6 C) 98.6 F (37 C)   TempSrc: Oral Oral Oral   SpO2: 96% 97% 96%   Weight:    70.3 kg (155 lb)  Height:    5' 5"  (1.651 m)    Intake/Output Summary (Last 24 hours) at 07/20/2017 0816 Last data filed at 07/20/2017 8677 Gross per 24  hour  Intake 782.5 ml  Output -  Net 782.5 ml   Filed Weights   07/19/17 1017 07/20/17 0634  Weight: 70.3 kg (155 lb) 70.3 kg (155 lb)    Examination: General exam: Appears comfortable  HEENT: PERRLA, oral mucosa moist, no sclera icterus or thrush- neck weakness noted Respiratory system: Clear to auscultation. Respiratory effort normal. Cardiovascular system: S1 & S2 heard, RRR.  No murmurs  Gastrointestinal system: Abdomen soft, non-tender, nondistended. Normal bowel sound. No organomegaly Central nervous system: Alert and oriented. No focal neurological deficits. Extremities: No cyanosis, clubbing or edema Skin: No rashes or ulcers Psychiatry:  Mood & affect appropriate.   Data Reviewed: I have personally reviewed following labs and imaging studies  CBC: Recent Labs  Lab 07/19/17 1150 07/20/17 0616 07/20/17 0641  WBC 9.7 6.1 6.9  NEUTROABS 8.7*  --   --   HGB 13.0 11.8* 12.5  HCT 40.6 37.1 39.1  MCV 95.8 95.9 95.6  PLT 311 236 373   Basic Metabolic Panel: Recent Labs  Lab 07/19/17 1150 07/20/17 0616  NA 135 140  K 3.7 3.6  CL 100* 106  CO2 21* 23  GLUCOSE 98 81  BUN 19 15  CREATININE 0.92 0.98  CALCIUM 9.2 8.6*  MG 2.1  --   PHOS 3.2  --    GFR: Estimated Creatinine Clearance: 47.3 mL/min (by C-G formula based on SCr of 0.98 mg/dL). Liver Function Tests:  Recent Labs  Lab 07/19/17 1150 07/20/17 0616  AST 65* 57*  ALT 54 46  ALKPHOS 57 47  BILITOT 0.6 0.6  PROT 7.6 5.9*  ALBUMIN 3.5 2.8*   No results for input(s): LIPASE, AMYLASE in the last 168 hours. No results for input(s): AMMONIA in the last 168 hours. Coagulation Profile: Recent Labs  Lab 07/19/17 1150  INR 1.04   Cardiac Enzymes: No results for input(s): CKTOTAL, CKMB, CKMBINDEX, TROPONINI in the last 168 hours. BNP (last 3 results) No results for input(s): PROBNP in the last 8760 hours. HbA1C: No results for input(s): HGBA1C in the last 72 hours. CBG: No results for input(s):  GLUCAP in the last 168 hours. Lipid Profile: No results for input(s): CHOL, HDL, LDLCALC, TRIG, CHOLHDL, LDLDIRECT in the last 72 hours. Thyroid Function Tests: Recent Labs    07/19/17 1152  TSH 0.556   Anemia Panel: No results for input(s): VITAMINB12, FOLATE, FERRITIN, TIBC, IRON, RETICCTPCT in the last 72 hours. Urine analysis: No results found for: COLORURINE, APPEARANCEUR, LABSPEC, PHURINE, GLUCOSEU, HGBUR, BILIRUBINUR, KETONESUR, PROTEINUR, UROBILINOGEN, NITRITE, LEUKOCYTESUR Sepsis Labs: '@[id number]'$ (procalcitonin:4,lacticidven:4) )No results found for this or any previous visit (from the past 240 hour(s)).       Radiology Studies: Ir Fluoro Guide Cv Line Right  Result Date: 07/19/2017 INDICATION: Myasthenia gravis. Please perform temporary dialysis catheter placement for the initiation of plasmapheresis. EXAM: NON-TUNNELED CENTRAL VENOUS HEMODIALYSIS CATHETER PLACEMENT WITH ULTRASOUND AND FLUOROSCOPIC GUIDANCE COMPARISON:  Chest CT - 06/02/2017 MEDICATIONS: None FLUOROSCOPY TIME:  54 seconds (9 mGy) COMPLICATIONS: None immediate. PROCEDURE: Informed written consent was obtained from the patient after a discussion of the risks, benefits, and alternatives to treatment. Questions regarding the procedure were encouraged and answered. The right neck and chest were prepped with chlorhexidine in a sterile fashion, and a sterile drape was applied covering the operative field. Maximum barrier sterile technique with sterile gowns and gloves were used for the procedure. A timeout was performed prior to the initiation of the procedure. After the overlying soft tissues were anesthetized, a small venotomy incision was created and a micropuncture kit was utilized to access the internal jugular vein. Real-time ultrasound guidance was utilized for vascular access including the acquisition of a permanent ultrasound image documenting patency of the accessed vessel. The microwire was utilized to measure  appropriate catheter length. A stiff glidewire was advanced to the level of the IVC. Under fluoroscopic guidance, the venotomy was serially dilated, ultimately allowing placement of a 20 cm temporary [place] catheter with tip ultimately terminating within the superior aspect of the right atrium. Final catheter positioning was confirmed and documented with a spot radiographic image. The catheter aspirates and flushes normally. The catheter was flushed with appropriate volume heparin dwells. The catheter exit site was secured with a 0-Prolene retention suture. A dressing was placed. The patient tolerated the procedure well without immediate post procedural complication. IMPRESSION: Successful placement of a right internal jugular approach 20 cm temporary dialysis catheter with tip terminating within the superior aspect of the right atrium. The catheter is ready for immediate use. PLAN: This catheter may be converted to a tunneled catheter at a later date as indicated. Electronically Signed   By: Sandi Mariscal M.D.   On: 07/19/2017 17:06   Ir US Guide Vasc Access Right  Result Date: 07/19/2017 INDICATION: Myasthenia gravis. Please perform temporary dialysis catheter placement for the initiation of plasmapheresis. EXAM: NON-TUNNELED CENTRAL VENOUS HEMODIALYSIS CATHETER PLACEMENT WITH ULTRASOUND AND FLUOROSCOPIC GUIDANCE COMPARISON:  Chest CT - 06/02/2017 MEDICATIONS:  None FLUOROSCOPY TIME:  54 seconds (9 mGy) COMPLICATIONS: None immediate. PROCEDURE: Informed written consent was obtained from the patient after a discussion of the risks, benefits, and alternatives to treatment. Questions regarding the procedure were encouraged and answered. The right neck and chest were prepped with chlorhexidine in a sterile fashion, and a sterile drape was applied covering the operative field. Maximum barrier sterile technique with sterile gowns and gloves were used for the procedure. A timeout was performed prior to the initiation  of the procedure. After the overlying soft tissues were anesthetized, a small venotomy incision was created and a micropuncture kit was utilized to access the internal jugular vein. Real-time ultrasound guidance was utilized for vascular access including the acquisition of a permanent ultrasound image documenting patency of the accessed vessel. The microwire was utilized to measure appropriate catheter length. A stiff glidewire was advanced to the level of the IVC. Under fluoroscopic guidance, the venotomy was serially dilated, ultimately allowing placement of a 20 cm temporary [place] catheter with tip ultimately terminating within the superior aspect of the right atrium. Final catheter positioning was confirmed and documented with a spot radiographic image. The catheter aspirates and flushes normally. The catheter was flushed with appropriate volume heparin dwells. The catheter exit site was secured with a 0-Prolene retention suture. A dressing was placed. The patient tolerated the procedure well without immediate post procedural complication. IMPRESSION: Successful placement of a right internal jugular approach 20 cm temporary dialysis catheter with tip terminating within the superior aspect of the right atrium. The catheter is ready for immediate use. PLAN: This catheter may be converted to a tunneled catheter at a later date as indicated. Electronically Signed   By: Sandi Mariscal M.D.   On: 07/19/2017 17:06      Scheduled Meds: . calcium carbonate  2 tablet Oral Q3H  . enoxaparin (LOVENOX) injection  40 mg Subcutaneous Q24H  . heparin  1,000 Units Intracatheter Once  . predniSONE  60 mg Oral Q breakfast  . pyridostigmine  60 mg Oral Q12H   Continuous Infusions: . sodium chloride 75 mL/hr at 07/20/17 0633  . therapeutic plasma exchange solution    . calcium gluconate IVPB    . citrate dextrose       LOS: 1 day    Time spent in minutes: 35    Debbe Odea, MD [facility] Pager: [email or web address] Password [email or web address] 07/20/2017, 8:16 AM

## 2017-07-20 NOTE — Evaluation (Signed)
Physical Therapy Evaluation/Discharge Patient Details Name: Marilyn [name] MRN: [id number] DOB: 11-16-1940 Today's Date: 07/20/2017   History of Present Illness  77 y.o. female admitted on 07/19/17 for progressive weakness associated with myesthenia gravis that was not responding to IVIG tx.  She is here acutely to receive plasmaphoresis treatments.  Pt with significant PMH of   OCD, cervical spine arthritis, HOH, migraine, low back pain, HTN, esophageal stricture, and breast CA R s/p lumpectomy.  Clinical Impression  Pt is moving independently and taking extra precautions with her mobility to be safe (using a cane for community mobility), using a soft neck support to help her hold her head up.  She is borderline with a DGI score of 19/24, but really moves well in this controlled environment.  PT will not follow acutely.  I have asked for a referral for mobility tech as she would benefit from regular walks while she is here.      Follow Up Recommendations No PT follow up;Other (comment)(none currently, may need some down the line)    Equipment Recommendations  None recommended by PT    Recommendations for Other Services   NA    Precautions / Restrictions Precautions Precautions: None Required Braces or Orthoses: Other Brace/Splint Other Brace/Splint: pt wears varying degrees of cervical support braces to help with head and neck control.       Mobility  Bed Mobility Overal bed mobility: Independent                Transfers Overall transfer level: Independent Equipment used: None                Ambulation/Gait Ambulation/Gait assistance: Modified independent (Device/Increase time) Ambulation Distance (Feet): 500 Feet Assistive device: None Gait Pattern/deviations: Step-through pattern;Staggering right;Staggering left   Gait velocity interpretation: at or above normal speed for age/gender General Gait Details: Pt with the occasional small stagger, but recovered  without physical assistance.  Stairs Stairs: Yes Stairs assistance: Modified independent (Device/Increase time) Stair Management: One rail Right;One rail Left;Alternating pattern;Forwards Number of Stairs: 10 General stair comments: pt able to reciprocally ascend and descend stairs with the support of the rail for balance.  When the rail was released she has LOB requiring assist to prevent fall, but with rail, she was fine.  She reports never doing stairs without a rail.          Balance                                 Standardized Balance Assessment Standardized Balance Assessment : Dynamic Gait Index   Dynamic Gait Index Level Surface: Normal Change in Gait Speed: Mild Impairment Gait with Horizontal Head Turns: Mild Impairment(pt has difficulty turning her head due to strength) Gait with Vertical Head Turns: Mild Impairment(pt had difficulty looking up due to cervical strength defici) Gait and Pivot Turn: Normal Step Over Obstacle: Mild Impairment Step Around Obstacles: Normal Steps: Mild Impairment Total Score: 19       Pertinent Vitals/Pain Pain Assessment: No/denies pain    Home Living Family/patient expects to be discharged to:: Private residence Living Arrangements: Alone;Other relatives   Type of Home: House       Home Layout: Two level;Able to live on main level with bedroom/bathroom        Prior Function Level of Independence: Independent         Comments: has a cane she uses at times, still  drives, retired, likes to bird watch, has two dogs (small) that she likes to walk.      Hand Dominance   Dominant Hand: Right    Extremity/Trunk Assessment   Upper Extremity Assessment Upper Extremity Assessment: Defer to OT evaluation    Lower Extremity Assessment Lower Extremity Assessment: RLE deficits/detail;LLE deficits/detail RLE Deficits / Details: bil hip flexors 4/5, knee 5/5, ankle 5/5 LLE Deficits / Details: bil hip flexors  4/5, knee 5/5, ankle 5/5    Cervical / Trunk Assessment Cervical / Trunk Assessment: Other exceptions Cervical / Trunk Exceptions: forward head with decreased muscle strength for upright posture against gravity.  Communication   Communication: HOH  Cognition Arousal/Alertness: Awake/alert Behavior During Therapy: WFL for tasks assessed/performed Overall Cognitive Status: Within Functional Limits for tasks assessed                                               Assessment/Plan    PT Assessment Patent does not need any further PT services         PT Goals (Current goals can be found in the Care Plan section)  Acute Rehab PT Goals PT Goal Formulation: All assessment and education complete, DC therapy            AM-PAC PT "6 Clicks" Daily Activity  Outcome Measure Difficulty turning over in bed (including adjusting bedclothes, sheets and blankets)?: None Difficulty moving from lying on back to sitting on the side of the bed? : None Difficulty sitting down on and standing up from a chair with arms (e.g., wheelchair, bedside commode, etc,.)?: None Help needed moving to and from a bed to chair (including a wheelchair)?: None Help needed walking in hospital room?: None Help needed climbing 3-5 steps with a railing? : None 6 Click Score: 24    End of Session   Activity Tolerance: Patient tolerated treatment well Patient left: in bed;with family/visitor present   PT Visit Diagnosis: Unsteadiness on feet (R26.81);Difficulty in walking, not elsewhere classified (R26.2)    Time: 2992-4268 PT Time Calculation (min) (ACUTE ONLY): 24 min   Charges:           Wells Guiles B. Irvan Tiedt, PT, DPT [phone]   PT Evaluation $PT Eval Moderate Complexity: 1 Mod PT Treatments $Gait Training: 8-22 mins   07/20/2017, 1:58 PM

## 2017-07-21 DIAGNOSIS — E039 Hypothyroidism, unspecified: Secondary | ICD-10-CM

## 2017-07-21 LAB — POCT I-STAT, CHEM 8
BUN: 18 mg/dL (ref 6–20)
CREATININE: 0.8 mg/dL (ref 0.44–1.00)
Calcium, Ion: 1.18 mmol/L (ref 1.15–1.40)
Chloride: 104 mmol/L (ref 101–111)
GLUCOSE: 266 mg/dL — AB (ref 65–99)
HEMATOCRIT: 37 % (ref 36.0–46.0)
Hemoglobin: 12.6 g/dL (ref 12.0–15.0)
POTASSIUM: 3.7 mmol/L (ref 3.5–5.1)
Sodium: 140 mmol/L (ref 135–145)
TCO2: 22 mmol/L (ref 22–32)

## 2017-07-21 NOTE — Progress Notes (Signed)
Subjective: Feels her rotation of her head is stronger but her head extension and shoulder abduction still weak.   Exam: Vitals:   07/20/17 2057 07/21/17 0433  BP: (!) 160/67 (!) 162/70  Pulse: 73 69  Resp: 18 18  Temp: 97.7 F (36.5 C) 98.6 F (37 C)  SpO2: 100% 100%    Physical Exam  HEENT-  Normocephalic, no lesions, without obvious abnormality.  Normal external eye and conjunctiva.   Cardiovascular- S1-S2 audible, pulses palpable throughout   Lungs-no rhonchi or wheezing noted, no excessive working breathing.  Saturations within normal limits Abdomen- All 4 quadrants palpated and nontender Extremities- Warm, dry and intact Musculoskeletal-no joint tenderness, deformity or swelling Skin-warm and dry, no hyperpigmentation, vitiligo, or suspicious lesions  Neuro:  Mental Status: Alert, oriented, thought content appropriate.  Speech fluent without evidence of aphasia.  Able to follow 3 step commands without difficulty. Cranial Nerves: II:  Visual fields grossly normal,  III,IV, VI: ptosis not present, extra-ocular motions intact bilaterally pupils equal, round, reactive to light and accommodation V,VII: smile symmetric, facial light touch sensation normal bilaterally VIII: hearing normal bilaterally IX,X: uvula rises symmetrically XI: bilateral shoulder shrug XII: midline tongue extension Motor: Head extension 1/5, shoulder abduction 4-/5 all else 5/5 Sensory: Pinprick and light touch intact throughout, bilaterally Deep Tendon Reflexes: 2+ and symmetric throughout Plantars: Right: downgoing   Left: downgoing     Medications:  Scheduled: . acidophilus  1 capsule Oral Daily  . enoxaparin (LOVENOX) injection  40 mg Subcutaneous Q24H  . flutamide  125 mg Oral BID  . heparin  1,000 Units Intracatheter Once  . losartan  50 mg Oral Daily  . minoxidil  2.5 mg Oral Daily  . predniSONE  60 mg Oral Q breakfast  . pyridostigmine  60 mg Oral Q12H  . vitamin C  500 mg Oral  Daily    Pertinent Labs/Diagnostics:   Ir Fluoro Guide Cv Line Right  Result Date: 07/19/2017 INDICATION: Myasthenia gravis. Please perform temporary dialysis catheter placement for the initiation of plasmapheresis. EXAM: NON-TUNNELED CENTRAL VENOUS HEMODIALYSIS CATHETER PLACEMENT WITH ULTRASOUND AND FLUOROSCOPIC GUIDANCE COMPARISON:  Chest CT - 06/02/2017 MEDICATIONS: None FLUOROSCOPY TIME:  54 seconds (9 mGy) COMPLICATIONS: None immediate. PROCEDURE: Informed written consent was obtained from the patient after a discussion of the risks, benefits, and alternatives to treatment. Questions regarding the procedure were encouraged and answered. The right neck and chest were prepped with chlorhexidine in a sterile fashion, and a sterile drape was applied covering the operative field. Maximum barrier sterile technique with sterile gowns and gloves were used for the procedure. A timeout was performed prior to the initiation of the procedure. After the overlying soft tissues were anesthetized, a small venotomy incision was created and a micropuncture kit was utilized to access the internal jugular vein. Real-time ultrasound guidance was utilized for vascular access including the acquisition of a permanent ultrasound image documenting patency of the accessed vessel. The microwire was utilized to measure appropriate catheter length. A stiff glidewire was advanced to the level of the IVC. Under fluoroscopic guidance, the venotomy was serially dilated, ultimately allowing placement of a 20 cm temporary Mahurkar catheter with tip ultimately terminating within the superior aspect of the right atrium. Final catheter positioning was confirmed and documented with a spot radiographic image. The catheter aspirates and flushes normally. The catheter was flushed with appropriate volume heparin dwells. The catheter exit site was secured with a 0-Prolene retention suture. A dressing was placed. The patient tolerated the procedure  well without immediate post procedural complication. IMPRESSION: Successful placement of a right internal jugular approach 20 cm temporary dialysis catheter with tip terminating within the superior aspect of the right atrium. The catheter is ready for immediate use. PLAN: This catheter may be converted to a tunneled catheter at a later date as indicated. Electronically Signed   By: Sandi Mariscal M.D.   On: 07/19/2017 17:06   Ir US Guide Vasc Access Right  Result Date: 07/19/2017 INDICATION: Myasthenia gravis. Please perform temporary dialysis catheter placement for the initiation of plasmapheresis. EXAM: NON-TUNNELED CENTRAL VENOUS HEMODIALYSIS CATHETER PLACEMENT WITH ULTRASOUND AND FLUOROSCOPIC GUIDANCE COMPARISON:  Chest CT - 06/02/2017 MEDICATIONS: None FLUOROSCOPY TIME:  54 seconds (9 mGy) COMPLICATIONS: None immediate. PROCEDURE: Informed written consent was obtained from the patient after a discussion of the risks, benefits, and alternatives to treatment. Questions regarding the procedure were encouraged and answered. The right neck and chest were prepped with chlorhexidine in a sterile fashion, and a sterile drape was applied covering the operative field. Maximum barrier sterile technique with sterile gowns and gloves were used for the procedure. A timeout was performed prior to the initiation of the procedure. After the overlying soft tissues were anesthetized, a small venotomy incision was created and a micropuncture kit was utilized to access the internal jugular vein. Real-time ultrasound guidance was utilized for vascular access including the acquisition of a permanent ultrasound image documenting patency of the accessed vessel. The microwire was utilized to measure appropriate catheter length. A stiff glidewire was advanced to the level of the IVC. Under fluoroscopic guidance, the venotomy was serially dilated, ultimately allowing placement of a 20 cm temporary Mahurkar catheter with tip ultimately  terminating within the superior aspect of the right atrium. Final catheter positioning was confirmed and documented with a spot radiographic image. The catheter aspirates and flushes normally. The catheter was flushed with appropriate volume heparin dwells. The catheter exit site was secured with a 0-Prolene retention suture. A dressing was placed. The patient tolerated the procedure well without immediate post procedural complication. IMPRESSION: Successful placement of a right internal jugular approach 20 cm temporary dialysis catheter with tip terminating within the superior aspect of the right atrium. The catheter is ready for immediate use. PLAN: This catheter may be converted to a tunneled catheter at a later date as indicated. Electronically Signed   By: Sandi Mariscal M.D.   On: 07/19/2017 17:06     Etta Quill PA-C [facility] [phone]  Impression: 77 year old female with what is likely seronegative myasthenia gravis as established by EMG with repetitive nerve stimulation and her symptoms.   1. Myasthenia Gravis exacerbation S/P PLEX 1/5. She continued to have weakness after IVIG and therefore was admitted to [facility] hospital for PLEX.  2. No clinical improvement so far. Will continue to monitor.   Recommendations: 1) Continue PLEX 2) Continue prednisone and Mestinon  Electronically signed: Dr. Kerney Elbe 07/21/2017, 10:19 AM

## 2017-07-21 NOTE — Progress Notes (Addendum)
Occupational Therapy Treatment Patient Details Name: Marilyn [name] MRN: [id number] DOB: 1941/02/09 Today's Date: 07/21/2017    History of present illness 77 y.o. female admitted on 07/19/17 for progressive weakness associated with myesthenia gravis that was not responding to IVIG tx.  She is here acutely to receive plasmaphoresis treatments.  Pt with significant PMH of   OCD, cervical spine arthritis, HOH, migraine, low back pain, HTN, esophageal stricture, and breast CA R s/p lumpectomy.   OT comments  Prior to session received verbal order for HeadMaster cervical collar from Dr. Marthenia Rolling in order to provide optimal functional cervical positioning for ADL and IADL tasks due to pt's head drop. Order placed with [employer] planning to fit pt this afternoon and discussed with pt who is in agreement. Pt beginning self-feeding tasks on my arrival to room and demonstrating difficulty with distal control due to decreased proximal stability. Positioned R UE for feeding with multiple pillows for temporary support with much improvement in ability to control movement of fork from plate to mouth with minimal spilling. D/C recommendation remains appropriate and OT will continue to follow while admitted with focus on HEP education next session.    Follow Up Recommendations  Outpatient OT    Equipment Recommendations  None recommended by OT    Recommendations for Other Services      Precautions / Restrictions Precautions Precautions: None Required Braces or Orthoses: Other Brace/Splint Other Brace/Splint: Received verbal order for HeadMaster cervical collar Restrictions Weight Bearing Restrictions: No       Mobility Bed Mobility               General bed mobility comments: OOB in chair on arrival  Transfers Overall transfer level: Independent Equipment used: None                  Balance Overall balance assessment: Mild deficits observed, not formally tested                                          ADL either performed or assessed with clinical judgement   ADL Overall ADL's : Needs assistance/impaired Eating/Feeding: Sitting;Supervision/ safety Eating/Feeding Details (indicate cue type and reason): Pt with poor proximal control to support RUE during self-feeding tasks. Provided support under R elbow to maximize ability to maximize distal control for self-feeding with improvement and decreased spilling.                      Toilet Transfer: Supervision/safety;Ambulation             General ADL Comments: Discussed use of HeadMaster Collar for support due to poor strength with cervical extension.     Vision       Perception     Praxis      Cognition Arousal/Alertness: Awake/alert Behavior During Therapy: WFL for tasks assessed/performed Overall Cognitive Status: Within Functional Limits for tasks assessed                                          Exercises     Shoulder Instructions       General Comments      Pertinent Vitals/ Pain       Pain Assessment: No/denies pain  Home Living  Prior Functioning/Environment              Frequency  Min 2X/week        Progress Toward Goals  OT Goals(current goals can now be found in the care plan section)  Progress towards OT goals: Progressing toward goals  Acute Rehab OT Goals Patient Stated Goal: back to feeding birds OT Goal Formulation: With patient/family Time For Goal Achievement: 08/03/17 Potential to Achieve Goals: Good  Plan Discharge plan remains appropriate    Co-evaluation                 AM-PAC PT "6 Clicks" Daily Activity     Outcome Measure   Help from another person eating meals?: A Little Help from another person taking care of personal grooming?: A Little Help from another person toileting, which includes using toliet, bedpan, or urinal?: A Little Help from  another person bathing (including washing, rinsing, drying)?: A Little Help from another person to put on and taking off regular upper body clothing?: A Little Help from another person to put on and taking off regular lower body clothing?: A Little 6 Click Score: 18    End of Session    OT Visit Diagnosis: Other abnormalities of gait and mobility (R26.89);Muscle weakness (generalized) (M62.81)   Activity Tolerance Patient tolerated treatment well   Patient Left in chair;with family/visitor present;with call bell/phone within reach   Nurse Communication Mobility status        Time: 1420-1440 OT Time Calculation (min): 20 min  Charges: OT General Charges $OT Visit: 1 Visit OT Treatments $Therapeutic Activity: 8-22 mins  Norman Herrlich, MS OTR/L  Pager: [place] 07/21/2017, 5:16 PM

## 2017-07-21 NOTE — Progress Notes (Signed)
PROGRESS NOTE    Marilyn [name]   EXH:[id number]  DOB: [birth year]  DOA: 07/19/2017 PCP: Gayland Curry, DO   Brief Narrative:  Marilyn [name] is a 77 y.o. female with medical history significant for breast cancer 11 years ago status post lumpectomy and radiation, OCD, hypothyroidism, hypertension, GERD and irritable bowel.  She has a new diagnosis of myasthenia gravis and has been on Prednisone with IVIG being given in the end of January without improvement in neurological symptoms. She is referred as a direct admit to start Plasmapheresis as inpatient.  Presenting complaints include drooping of her head, poor visual acuity, diplopia and upper extremity weakness.    Subjective: Patient was seen alongside patient's sister. Patient continues to experience neck drops. No double vision or blurred vision. Arms are not weak.  ROS: no complaints of nausea, vomiting, constipation diarrhea, cough, dyspnea or dysuria. No other complaints.   Assessment & Plan:   Principal Problem:   Myasthenia gravis with (acute) exacerbation  - HD temp cath placed last night - Neuro has consulted and ordered the plasmapheresis for 5 treatments every other day - cont Prednisone and Mestinon -Plasmapheresis is in progress without any complications.  Specifically, there is no bleeding.  Active Problems:     Hypertension - she takes Cozaar for this -Continue to optimize.  Hair loss - Minoxidil, Flutamide  DVT prophylaxis: Lovenox Code Status: Full code Family Communication: Full code Disposition Plan:  Consultants:   neurology Procedures:   Temp dialysis cath Antimicrobials:  Anti-infectives (From admission, onward)   None       Objective: Vitals:   07/20/17 2057 07/21/17 0433 07/21/17 0500 07/21/17 1337  BP: (!) 160/67 (!) 162/70  (!) 157/72  Pulse: 73 69  70  Resp: 18 18    Temp: 97.7 F (36.5 C) 98.6 F (37 C)  98.7 F (37.1 C)  TempSrc: Oral Oral  Oral  SpO2: 100% 100%  97%    Weight:   155 lb (70.3 kg)   Height:        Intake/Output Summary (Last 24 hours) at 07/21/2017 1751 Last data filed at 07/21/2017 1700 Gross per 24 hour  Intake 500 ml  Output -  Net 500 ml   Filed Weights   07/20/17 1530 07/20/17 1753 07/21/17 0500  Weight: 154 lb 15.7 oz (70.3 kg) 155 lb 6.8 oz (70.5 kg) 155 lb (70.3 kg)    Examination: General exam: Appears comfortable.  The patient is awake, alert and oriented to time, place and person. HEENT: PERRLA, oral mucosa moist, no sclera icterus or thrush- neck weakness noted.  Patient uses her upper extremities to support her chin. Respiratory system: Clear to auscultation. Respiratory effort normal. Cardiovascular system: S1 & S2 heard, RRR.  No murmurs  Gastrointestinal system: Abdomen soft, non-tender, nondistended. Normal bowel sound. No organomegaly Central nervous system: Alert and oriented. No focal neurological deficits. Extremities: No cyanosis, clubbing or edema  Data Reviewed: I have personally reviewed following labs and imaging studies  CBC: Recent Labs  Lab 07/19/17 1150 07/20/17 0616 07/20/17 0641 07/20/17 1556  WBC 9.7 6.1 6.9  --   NEUTROABS 8.7*  --   --   --   HGB 13.0 11.8* 12.5 12.6  HCT 40.6 37.1 39.1 37.0  MCV 95.8 95.9 95.6  --   PLT 311 236 265  --    Basic Metabolic Panel: Recent Labs  Lab 07/19/17 1150 07/20/17 0616 07/20/17 1556  NA 135 140 140  K 3.7  3.6 3.7  CL 100* 106 104  CO2 21* 23  --   GLUCOSE 98 81 266*  BUN 19 15 18   CREATININE 0.92 0.98 0.80  CALCIUM 9.2 8.6*  --   MG 2.1  --   --   PHOS 3.2  --   --    GFR: Estimated Creatinine Clearance: 57.9 mL/min (by C-G formula based on SCr of 0.8 mg/dL). Liver Function Tests: Recent Labs  Lab 07/19/17 1150 07/20/17 0616  AST 65* 57*  ALT 54 46  ALKPHOS 57 47  BILITOT 0.6 0.6  PROT 7.6 5.9*  ALBUMIN 3.5 2.8*   No results for input(s): LIPASE, AMYLASE in the last 168 hours. No results for input(s): AMMONIA in the last  168 hours. Coagulation Profile: Recent Labs  Lab 07/19/17 1150  INR 1.04   Cardiac Enzymes: No results for input(s): CKTOTAL, CKMB, CKMBINDEX, TROPONINI in the last 168 hours. BNP (last 3 results) No results for input(s): PROBNP in the last 8760 hours. HbA1C: No results for input(s): HGBA1C in the last 72 hours. CBG: No results for input(s): GLUCAP in the last 168 hours. Lipid Profile: No results for input(s): CHOL, HDL, LDLCALC, TRIG, CHOLHDL, LDLDIRECT in the last 72 hours. Thyroid Function Tests: Recent Labs    07/19/17 1152  TSH 0.556   Anemia Panel: No results for input(s): VITAMINB12, FOLATE, FERRITIN, TIBC, IRON, RETICCTPCT in the last 72 hours. Urine analysis: No results found for: COLORURINE, APPEARANCEUR, LABSPEC, PHURINE, GLUCOSEU, HGBUR, BILIRUBINUR, KETONESUR, PROTEINUR, UROBILINOGEN, NITRITE, LEUKOCYTESUR Sepsis Labs: @LABRCNTIP (procalcitonin:4,lacticidven:4) )No results found for this or any previous visit (from the past 240 hour(s)).       Radiology Studies: No results found.    Scheduled Meds: . acidophilus  1 capsule Oral Daily  . enoxaparin (LOVENOX) injection  40 mg Subcutaneous Q24H  . flutamide  125 mg Oral BID  . heparin  1,000 Units Intracatheter Once  . losartan  50 mg Oral Daily  . minoxidil  2.5 mg Oral Daily  . predniSONE  60 mg Oral Q breakfast  . pyridostigmine  60 mg Oral Q12H  . vitamin C  500 mg Oral Daily   Continuous Infusions:    LOS: 2 days    Time spent in minutes: 35    Bonnell Public, MD [facility] Pager: [phone]. [email or web address] Password [facility] 07/21/2017, 5:51 PM

## 2017-07-21 NOTE — Progress Notes (Signed)
Orthopedic Tech Progress Note Patient Details:  Marilyn [name] Nov 23, 1940 [id number]  Patient ID: Conard Novak, female   DOB: Oct 14, 1940, 77 y.o.   MRN: [id number]   Hildred Priest 07/21/2017, 2:38 PM Called in [employer] brace order; spoke with Bella Kennedy

## 2017-07-22 LAB — CBC
HEMATOCRIT: 38.7 % (ref 36.0–46.0)
HEMOGLOBIN: 12.2 g/dL (ref 12.0–15.0)
MCH: 30.6 pg (ref 26.0–34.0)
MCHC: 31.5 g/dL (ref 30.0–36.0)
MCV: 97 fL (ref 78.0–100.0)
Platelets: 222 10*3/uL (ref 150–400)
RBC: 3.99 MIL/uL (ref 3.87–5.11)
RDW: 14.5 % (ref 11.5–15.5)
WBC: 9.6 10*3/uL (ref 4.0–10.5)

## 2017-07-22 LAB — COMPREHENSIVE METABOLIC PANEL
ALBUMIN: 3.7 g/dL (ref 3.5–5.0)
ALK PHOS: 37 U/L — AB (ref 38–126)
ALT: 38 U/L (ref 14–54)
ANION GAP: 10 (ref 5–15)
AST: 56 U/L — AB (ref 15–41)
BUN: 10 mg/dL (ref 6–20)
CO2: 23 mmol/L (ref 22–32)
Calcium: 8.8 mg/dL — ABNORMAL LOW (ref 8.9–10.3)
Chloride: 107 mmol/L (ref 101–111)
Creatinine, Ser: 0.95 mg/dL (ref 0.44–1.00)
GFR calc Af Amer: >60 mL/min (ref >60)
GFR calc non Af Amer: 56 mL/min — ABNORMAL LOW (ref >60)
GLUCOSE: 175 mg/dL — AB (ref 65–99)
POTASSIUM: 3.8 mmol/L (ref 3.5–5.1)
SODIUM: 140 mmol/L (ref 135–145)
Total Bilirubin: 0.5 mg/dL (ref 0.3–1.2)
Total Protein: 5.9 g/dL — ABNORMAL LOW (ref 6.5–8.1)

## 2017-07-22 MED ORDER — ACD FORMULA A 0.73-2.45-2.2 GM/100ML VI SOLN
Status: AC
  Administered 2017-07-22: 500 mL
  Filled 2017-07-22: qty 500

## 2017-07-22 MED ORDER — SODIUM CHLORIDE 0.9 % IV SOLN
Freq: Once | INTRAVENOUS | Status: AC
  Administered 2017-07-22 (×3): via INTRAVENOUS_CENTRAL
  Filled 2017-07-22 (×3): qty 200

## 2017-07-22 MED ORDER — ACETAMINOPHEN 325 MG PO TABS
650.0000 mg | ORAL_TABLET | ORAL | Status: DC | PRN
Start: 2017-07-22 — End: 2017-07-26

## 2017-07-22 MED ORDER — DIPHENHYDRAMINE HCL 25 MG PO CAPS
ORAL_CAPSULE | ORAL | Status: AC
  Filled 2017-07-22: qty 1

## 2017-07-22 MED ORDER — SODIUM CHLORIDE 0.9 % IV SOLN
2.0000 g | Freq: Once | INTRAVENOUS | Status: AC
  Administered 2017-07-22: 2 g via INTRAVENOUS
  Filled 2017-07-22 (×2): qty 20

## 2017-07-22 MED ORDER — DIPHENHYDRAMINE HCL 25 MG PO CAPS
25.0000 mg | ORAL_CAPSULE | Freq: Four times a day (QID) | ORAL | Status: DC | PRN
  Administered 2017-07-22 – 2017-07-24 (×2): 25 mg via ORAL

## 2017-07-22 MED ORDER — CALCIUM CARBONATE ANTACID 500 MG PO CHEW
CHEWABLE_TABLET | ORAL | Status: AC
  Administered 2017-07-22: 400 mg via ORAL
  Filled 2017-07-22: qty 2

## 2017-07-22 MED ORDER — CALCIUM CARBONATE ANTACID 500 MG PO CHEW
2.0000 | CHEWABLE_TABLET | ORAL | Status: AC
  Administered 2017-07-22 (×2): 400 mg via ORAL
  Filled 2017-07-22: qty 2

## 2017-07-22 MED ORDER — HEPARIN SODIUM (PORCINE) 1000 UNIT/ML IJ SOLN
1000.0000 [IU] | Freq: Once | INTRAMUSCULAR | Status: DC
  Filled 2017-07-22: qty 1

## 2017-07-22 MED ORDER — ACD FORMULA A 0.73-2.45-2.2 GM/100ML VI SOLN
500.0000 mL | Status: DC
  Filled 2017-07-22 (×2): qty 500

## 2017-07-22 NOTE — Progress Notes (Signed)
PROGRESS NOTE    KEYLY BALDONADO   UDJ:[id number]  DOB: 05/23/1941  DOA: 07/19/2017 PCP: Gayland Curry, DO   Brief Narrative:  TALISHIA BETZLER is a 77 y.o. female with medical history significant for breast cancer 11 years ago status post lumpectomy and radiation, OCD, hypothyroidism, hypertension, GERD and irritable bowel.  She has a new diagnosis of myasthenia gravis and has been on Prednisone with IVIG being given in the end of January without improvement in neurological symptoms. She is referred as a direct admit to start Plasmapheresis as inpatient. Presenting complaints include drooping of her head, poor visual acuity, diplopia and upper extremity weakness.  07/22/2017: There has been no significant change.  Patient is going for second plasmapheresis treatment today.  Neurology input is highly appreciated.  Subjective: Head drop continues. No significant improvement.  ROS: no complaints of nausea, vomiting, constipation diarrhea, cough, dyspnea or dysuria. No other complaints.   Assessment & Plan:   Principal Problem:   Myasthenia gravis with (acute) exacerbation  - HD temp cath placed last night - Neuro has consulted and ordered the plasmapheresis for 5 treatments every other day - cont Prednisone and Mestinon -Plasmapheresis is in progress without any complications.  Specifically, there is no bleeding. -For second plasmapheresis treatment today. -No significant change since admission as per the patient.  Active Problems:     Hypertension - she takes Cozaar for this -Continue to optimize.  Hair loss - Minoxidil, Flutamide  DVT prophylaxis: Lovenox Code Status: Full code Family Communication: Full code Disposition Plan:  Consultants:   neurology Procedures:   Temp dialysis cath Antimicrobials:  Anti-infectives (From admission, onward)   None       Objective: Vitals:   07/21/17 2057 07/21/17 2105 07/22/17 0649 07/22/17 0800  BP: (!) 160/64 (!) 160/64 (!)  161/63   Pulse: 79  79 77  Resp: 18  18   Temp: 98.1 F (36.7 C)  98 F (36.7 C) 98.6 F (37 C)  TempSrc: Oral  Oral Oral  SpO2: 98%  98% 98%  Weight:      Height:        Intake/Output Summary (Last 24 hours) at 07/22/2017 1154 Last data filed at 07/22/2017 0900 Gross per 24 hour  Intake 580 ml  Output -  Net 580 ml   Filed Weights   07/20/17 1530 07/20/17 1753 07/21/17 0500  Weight: 154 lb 15.7 oz (70.3 kg) 155 lb 6.8 oz (70.5 kg) 155 lb (70.3 kg)    Examination: General exam: Appears comfortable.  The patient is awake, alert and oriented to time, place and person. HEENT: PERRLA, oral mucosa moist, no sclera icterus or thrush- neck weakness noted.  Patient uses her upper extremities to support her chin. Respiratory system: Clear to auscultation. Respiratory effort normal. Cardiovascular system: S1 & S2 heard, RRR.  No murmurs  Gastrointestinal system: Abdomen soft, non-tender, nondistended. Normal bowel sound. No organomegaly Central nervous system: Alert and oriented. No focal neurological deficits. Extremities: No cyanosis, clubbing or edema  Data Reviewed: I have personally reviewed following labs and imaging studies  CBC: Recent Labs  Lab 07/19/17 1150 07/20/17 0616 07/20/17 0641 07/20/17 1556  WBC 9.7 6.1 6.9  --   NEUTROABS 8.7*  --   --   --   HGB 13.0 11.8* 12.5 12.6  HCT 40.6 37.1 39.1 37.0  MCV 95.8 95.9 95.6  --   PLT 311 236 265  --    Basic Metabolic Panel: Recent Labs  Lab  07/19/17 1150 07/20/17 0616 07/20/17 1556  NA 135 140 140  K 3.7 3.6 3.7  CL 100* 106 104  CO2 21* 23  --   GLUCOSE 98 81 266*  BUN 19 15 18   CREATININE 0.92 0.98 0.80  CALCIUM 9.2 8.6*  --   MG 2.1  --   --   PHOS 3.2  --   --    GFR: Estimated Creatinine Clearance: 57.9 mL/min (by C-G formula based on SCr of 0.8 mg/dL). Liver Function Tests: Recent Labs  Lab 07/19/17 1150 07/20/17 0616  AST 65* 57*  ALT 54 46  ALKPHOS 57 47  BILITOT 0.6 0.6  PROT 7.6 5.9*    ALBUMIN 3.5 2.8*   No results for input(s): LIPASE, AMYLASE in the last 168 hours. No results for input(s): AMMONIA in the last 168 hours. Coagulation Profile: Recent Labs  Lab 07/19/17 1150  INR 1.04   Cardiac Enzymes: No results for input(s): CKTOTAL, CKMB, CKMBINDEX, TROPONINI in the last 168 hours. BNP (last 3 results) No results for input(s): PROBNP in the last 8760 hours. HbA1C: No results for input(s): HGBA1C in the last 72 hours. CBG: No results for input(s): GLUCAP in the last 168 hours. Lipid Profile: No results for input(s): CHOL, HDL, LDLCALC, TRIG, CHOLHDL, LDLDIRECT in the last 72 hours. Thyroid Function Tests: No results for input(s): TSH, T4TOTAL, FREET4, T3FREE, THYROIDAB in the last 72 hours. Anemia Panel: No results for input(s): VITAMINB12, FOLATE, FERRITIN, TIBC, IRON, RETICCTPCT in the last 72 hours. Urine analysis: No results found for: COLORURINE, APPEARANCEUR, LABSPEC, PHURINE, GLUCOSEU, HGBUR, BILIRUBINUR, KETONESUR, PROTEINUR, UROBILINOGEN, NITRITE, LEUKOCYTESUR Sepsis Labs: @LABRCNTIP (procalcitonin:4,lacticidven:4) )No results found for this or any previous visit (from the past 240 hour(s)).       Radiology Studies: No results found.    Scheduled Meds: . acidophilus  1 capsule Oral Daily  . calcium carbonate  2 tablet Oral Q3H  . enoxaparin (LOVENOX) injection  40 mg Subcutaneous Q24H  . flutamide  125 mg Oral BID  . heparin  1,000 Units Intracatheter Once  . heparin  1,000 Units Intracatheter Once  . losartan  50 mg Oral Daily  . minoxidil  2.5 mg Oral Daily  . predniSONE  60 mg Oral Q breakfast  . pyridostigmine  60 mg Oral Q12H  . vitamin C  500 mg Oral Daily   Continuous Infusions: . therapeutic plasma exchange solution    . calcium gluconate IVPB    . citrate dextrose       LOS: 3 days    Time spent in minutes: 25    Bonnell Public, MD [facility] Pager: [phone]. [email or web address] Password  [facility] 07/22/2017, 11:54 AM

## 2017-07-22 NOTE — Progress Notes (Signed)
Patient seemed to have had a reaction to the treatment she received. Patient was concerned that her tongue may be swelling and wanted the doctor to be notified. Neurologist was contacted and new orders were placed for closer monitoring. Patient is not experiencing any shortness of breath. Also, wanted reassurance she could eat. Patient was told she could eat. Will continue to monitor patient status.

## 2017-07-22 NOTE — Progress Notes (Signed)
Orthopedic Tech Progress Note Patient Details:  Marilyn [name] Feb 03, 1941 [id number]  Patient ID: Marilyn [name], female   DOB: Jun 22, 1940, 77 y.o.   MRN: [id number]   Marilyn [name] 07/22/2017, 8:22 AMCalled [employer] for Headmaster collar.

## 2017-07-22 NOTE — Progress Notes (Signed)
Subjective: The patient feels that she has not yet had any improvement after her first PLEX treatment. Second PLEX scheduled for today. She also feels that the steroid treatment as an outpatient did not even have a subtle effect upon her neck strength, but does state that initial muscle pain that was present at onset of the weakness, back in early November, has mostly resolved. She endorses first symptom in November of neck extensor weakness, with trouble holding her head up, in addition to some swallowing difficulty, hoarse voice and mild tongue weakness, in addition to hand weakness bilaterally (dropping objects) and muscle pain in her posterior neck, shoulders and upper arms. Her leg strength was not affected, but she did have some SOB recently when walking to and from her mailbox at home.   Objective: Current vital signs: BP (!) 161/63 (BP Location: Right Arm)   Pulse 77   Temp 98.6 F (37 C) (Oral)   Resp 18   Ht 5\' 5"  (1.651 m)   Wt 70.3 kg (155 lb)   SpO2 98%   BMI 25.79 kg/m  Vital signs in last 24 hours: Temp:  [98 F (36.7 C)-98.7 F (37.1 C)] 98.6 F (37 C) (02/14 0800) Pulse Rate:  [70-79] 77 (02/14 0800) Resp:  [18] 18 (02/14 0649) BP: (157-161)/(63-72) 161/63 (02/14 0649) SpO2:  [97 %-98 %] 98 % (02/14 0800)  Intake/Output from previous day: 02/13 0701 - 02/14 0700 In: 720 [P.O.:720] Out: -  Intake/Output this shift: Total I/O In: 120 [P.O.:120] Out: -  Nutritional status: Diet regular Room service appropriate? Yes; Fluid consistency: Thin  Neurologic Exam: Mental Status: Intact to complex questions and commands. Pleasant and cooperative. Good attention and insight.  Cranial Nerves: II:  Pupils symmetric. Fixates and tracks normally.  III,IV, VI: Eyes conjugate with intact EOM. No nystagmus.  VII: No facial weakness or asymmetry noted.  VIII: Hearing intact to conversation IX,X: No hypophonia or hoarseness. No pharyngeal dysarthria.  XI: SCM's 4/5 bilaterally.   XII: midline tongue extension without atrophy or fasciculations. Tongue strength slightly weak with leftward deviation into cheek, normal to the right. No lingual dysarthria.  Motor: RUE and LUE: 4+/5 deltoids, 5/5 triceps and biceps, 4+/5 grip strength with some weakness to hand intrinsics also noted RLE and LLE: 5/5 Deep Tendon Reflexes:  Normoactive x 4.  Cerebellar: No ataxia with FNF bilaterally Gait: Deferred  Lab Results: Results for orders placed or performed during the hospital encounter of 07/19/17 (from the past 48 hour(s))  I-STAT, chem 8     Status: Abnormal   Collection Time: 07/20/17  3:56 PM  Result Value Ref Range   Sodium 140 135 - 145 mmol/L   Potassium 3.7 3.5 - 5.1 mmol/L   Chloride 104 101 - 111 mmol/L   BUN 18 6 - 20 mg/dL   Creatinine, Ser 0.80 0.44 - 1.00 mg/dL   Glucose, Bld 266 (H) 65 - 99 mg/dL   Calcium, Ion 1.18 1.15 - 1.40 mmol/L   TCO2 22 22 - 32 mmol/L   Hemoglobin 12.6 12.0 - 15.0 g/dL   HCT 37.0 36.0 - 46.0 %    No results found for this or any previous visit (from the past 240 hour(s)).  Lipid Panel No results for input(s): CHOL, TRIG, HDL, CHOLHDL, VLDL, LDLCALC in the last 72 hours.  Studies/Results: No results found.  Medications:  Scheduled: . acidophilus  1 capsule Oral Daily  . calcium carbonate  2 tablet Oral Q3H  . enoxaparin (LOVENOX) injection  40 mg Subcutaneous Q24H  . flutamide  125 mg Oral BID  . heparin  1,000 Units Intracatheter Once  . heparin  1,000 Units Intracatheter Once  . losartan  50 mg Oral Daily  . minoxidil  2.5 mg Oral Daily  . predniSONE  60 mg Oral Q breakfast  . pyridostigmine  60 mg Oral Q12H  . vitamin C  500 mg Oral Daily   Continuous: . therapeutic plasma exchange solution    . calcium gluconate IVPB    . citrate dextrose     GGE:[id number] **OR** acetaminophen, acetaminophen, acetaminophen, diphenhydrAMINE, diphenhydrAMINE, heparin, hydrALAZINE, lidocaine, ondansetron **OR** ondansetron  (ZOFRAN) IV, pyridostigmine  Impression:77 year old female with what is likely seronegative myasthenia gravis as established by EMG with repetitive nerve stimulation and her symptoms.  1. Myasthenia Gravis exacerbation S/P PLEX 1/5. She continued to have weakness after IVIG and therefore was admitted to [facility] hospital for PLEX.  2. No clinical improvement so far. She is scheduled for PLEX #2 today. Will continue to monitor.  3. Discussed case with Dr. Posey Pronto. Given the atypical presentation, other DDx are also being considered, including focal posterior cervical myopathy,  ALS and inclusion body myositis. A polymyositis variant is felt to be unlikely as prior CK was normal. MRI cervical spine done previously showed no muscle edema.  4. Also on the DDx is hyperparathyroidism.   5. TSH is normal.   Recommendations: 1. Continue PLEX. If the patient does not improve following the full course of treatment, Dr. Posey Pronto is considering further work up for rare/uncommon causes of neck extensor weakness. The additional presenting features of mild tongue, pharyngeal and hand weakness in addition to the muscle pain initially experienced by the patient may also serve to increase specificity in narrowing the DDx, if MG is determined to be less likely should there be a lack of response to the full course of PLEX.   2. Continue prednisone and Mestinon 3. PTH level (ordered)   LOS: 3 days   @Electronically  signed: Dr. Kerney Elbe 07/22/2017  11:33 AM

## 2017-07-23 LAB — INFLUENZA PANEL BY PCR (TYPE A & B)
INFLAPCR: NEGATIVE
Influenza B By PCR: NEGATIVE

## 2017-07-23 LAB — PTH, INTACT AND CALCIUM
Calcium, Total (PTH): 8.4 mg/dL — ABNORMAL LOW (ref 8.7–10.3)
PTH: 47 pg/mL (ref 15–65)

## 2017-07-23 MED ORDER — PHENOL 1.4 % MT LIQD
1.0000 | OROMUCOSAL | Status: DC | PRN
  Administered 2017-07-23: 1 via OROMUCOSAL
  Filled 2017-07-23: qty 177

## 2017-07-23 NOTE — Progress Notes (Signed)
PROGRESS NOTE    Marilyn [name]   WJX:[id number]  DOB: 12-Jul-1940  DOA: 07/19/2017 PCP: Gayland Curry, DO   Brief Narrative:  Marilyn [name] is a 77 y.o. female with medical history significant for breast cancer 11 years ago status post lumpectomy and radiation, OCD, hypothyroidism, hypertension, GERD and irritable bowel.  She has a new diagnosis of myasthenia gravis and has been on Prednisone with IVIG being given in the end of January without improvement in neurological symptoms. She is referred as a direct admit to start Plasmapheresis as inpatient. Presenting complaints include drooping of her head, poor visual acuity, diplopia and upper extremity weakness.  07/22/2017: There has been no significant change.  Patient is going for second plasmapheresis treatment today.  Neurology input is highly appreciated.  07/23/2017: Patient seen alongside patient's friend.  Patient reported some improvement after plasmapheresis yesterday.  Patient also reports a what appears to be allergic reaction yesterday.  Etiology of the allergy is unknown to me.  Medical equipment team has provided patient with headmaster collar.  This seems to be helping the patient significantly to support her head.  No fever or chills, no headache, no neck pain, no shortness of breath.   Subjective: Head drop continues. Reported some improvement after second dose of plasmapheresis.   Assessment & Plan:   Principal Problem:   Myasthenia gravis with (acute) exacerbation  - HD temp cath placed last night - Neuro has consulted and ordered the plasmapheresis for 5 treatments every other day - cont Prednisone and Mestinon -Plasmapheresis is in progress without any complications.  Specifically, there is no bleeding. -For second plasmapheresis treatment today. -No significant change since admission as per the patient.  Active Problems:     Hypertension - she takes Cozaar for this -Continue to optimize.  Hair loss -  Minoxidil, Flutamide  DVT prophylaxis: Lovenox Code Status: Full code Family Communication: Full code Disposition Plan:  Consultants:   neurology Procedures:   Temp dialysis cath Antimicrobials:  Anti-infectives (From admission, onward)   None       Objective: Vitals:   07/22/17 1516 07/22/17 2146 07/23/17 0416 07/23/17 1354  BP: (!) 159/65 133/76 (!) 157/58 (!) 150/65  Pulse:  91 74 84  Resp: 20 17 17 16   Temp: 98.7 F (37.1 C) 98.5 F (36.9 C) 98 F (36.7 C) (!) 97.5 F (36.4 C)  TempSrc: Oral Oral Oral Oral  SpO2:  96% 98%   Weight:   168 lb 10.4 oz (76.5 kg)   Height:        Intake/Output Summary (Last 24 hours) at 07/23/2017 2013 Last data filed at 07/23/2017 0900 Gross per 24 hour  Intake 540 ml  Output -  Net 540 ml   Filed Weights   07/20/17 1753 07/21/17 0500 07/23/17 0416  Weight: 155 lb 6.8 oz (70.5 kg) 155 lb (70.3 kg) 168 lb 10.4 oz (76.5 kg)    Examination: General exam: Appears comfortable.  The patient is awake, alert and oriented to time, place and person. HEENT: PERRLA, oral mucosa moist, no sclera icterus or thrush- neck weakness noted.  Patient uses her upper extremities to support her chin. Respiratory system: Clear to auscultation. Respiratory effort normal. Cardiovascular system: S1 & S2 heard, RRR.  No murmurs  Gastrointestinal system: Abdomen soft, non-tender, nondistended. Normal bowel sound. No organomegaly Central nervous system: Alert and oriented. No focal neurological deficits. Extremities: No cyanosis, clubbing or edema  Data Reviewed: I have personally reviewed following labs and imaging  studies  CBC: Recent Labs  Lab 07/19/17 1150 07/20/17 0616 07/20/17 0641 07/20/17 1556 07/22/17 1359  WBC 9.7 6.1 6.9  --  9.6  NEUTROABS 8.7*  --   --   --   --   HGB 13.0 11.8* 12.5 12.6 12.2  HCT 40.6 37.1 39.1 37.0 38.7  MCV 95.8 95.9 95.6  --  97.0  PLT 311 236 265  --  035   Basic Metabolic Panel: Recent Labs  Lab  07/19/17 1150 07/20/17 0616 07/20/17 1556 07/22/17 1359 07/22/17 1559  NA 135 140 140 140  --   K 3.7 3.6 3.7 3.8  --   CL 100* 106 104 107  --   CO2 21* 23  --  23  --   GLUCOSE 98 81 266* 175*  --   BUN 19 15 18 10   --   CREATININE 0.92 0.98 0.80 0.95  --   CALCIUM 9.2 8.6*  --  8.8* 8.4*  MG 2.1  --   --   --   --   PHOS 3.2  --   --   --   --    GFR: Estimated Creatinine Clearance: 50.7 mL/min (by C-G formula based on SCr of 0.95 mg/dL). Liver Function Tests: Recent Labs  Lab 07/19/17 1150 07/20/17 0616 07/22/17 1359  AST 65* 57* 56*  ALT 54 46 38  ALKPHOS 57 47 37*  BILITOT 0.6 0.6 0.5  PROT 7.6 5.9* 5.9*  ALBUMIN 3.5 2.8* 3.7   No results for input(s): LIPASE, AMYLASE in the last 168 hours. No results for input(s): AMMONIA in the last 168 hours. Coagulation Profile: Recent Labs  Lab 07/19/17 1150  INR 1.04   Cardiac Enzymes: No results for input(s): CKTOTAL, CKMB, CKMBINDEX, TROPONINI in the last 168 hours. BNP (last 3 results) No results for input(s): PROBNP in the last 8760 hours. HbA1C: No results for input(s): HGBA1C in the last 72 hours. CBG: No results for input(s): GLUCAP in the last 168 hours. Lipid Profile: No results for input(s): CHOL, HDL, LDLCALC, TRIG, CHOLHDL, LDLDIRECT in the last 72 hours. Thyroid Function Tests: No results for input(s): TSH, T4TOTAL, FREET4, T3FREE, THYROIDAB in the last 72 hours. Anemia Panel: No results for input(s): VITAMINB12, FOLATE, FERRITIN, TIBC, IRON, RETICCTPCT in the last 72 hours. Urine analysis: No results found for: COLORURINE, APPEARANCEUR, LABSPEC, PHURINE, GLUCOSEU, HGBUR, BILIRUBINUR, KETONESUR, PROTEINUR, UROBILINOGEN, NITRITE, LEUKOCYTESUR Sepsis Labs: @LABRCNTIP (procalcitonin:4,lacticidven:4) )No results found for this or any previous visit (from the past 240 hour(s)).       Radiology Studies: No results found.    Scheduled Meds: . acidophilus  1 capsule Oral Daily  . enoxaparin  (LOVENOX) injection  40 mg Subcutaneous Q24H  . flutamide  125 mg Oral BID  . heparin  1,000 Units Intracatheter Once  . heparin  1,000 Units Intracatheter Once  . losartan  50 mg Oral Daily  . minoxidil  2.5 mg Oral Daily  . predniSONE  60 mg Oral Q breakfast  . pyridostigmine  60 mg Oral Q12H  . vitamin C  500 mg Oral Daily   Continuous Infusions: . citrate dextrose       LOS: 4 days    Time spent in minutes: 25    Bonnell Public, MD [facility] Pager: [phone]. [email or web address] Password [facility] 07/23/2017, 8:13 PM

## 2017-07-23 NOTE — Progress Notes (Signed)
Occupational Therapy Treatment Patient Details Name: Marilyn [name] MRN: [id number] DOB: Apr 07, 1941 Today's Date: 07/23/2017    History of present illness 77 y.o. female admitted on 07/19/17 for progressive weakness associated with myesthenia gravis that was not responding to IVIG tx.  She is here acutely to receive plasmaphoresis treatments.  Pt with significant PMH of   OCD, cervical spine arthritis, HOH, migraine, low back pain, HTN, esophageal stricture, and breast CA R s/p lumpectomy.   OT comments  Focus of session on education of HeadMaster Collar and scapular strengthening exercises. Educated pt on management of collar and watching to sink break down or areas of pressure. D/c plan remains appropriate. Will continue to follow acutely.   Follow Up Recommendations  Outpatient OT    Equipment Recommendations  None recommended by OT    Recommendations for Other Services      Precautions / Restrictions Precautions Precautions: None Required Braces or Orthoses: Other Brace/Splint Other Brace/Splint: HeadMaster Collar Restrictions Weight Bearing Restrictions: No       Mobility Bed Mobility               General bed mobility comments: OOB in chair on arrival  Transfers Overall transfer level: Independent Equipment used: None                  Balance Overall balance assessment: Mild deficits observed, not formally tested                                         ADL either performed or assessed with clinical judgement   ADL                                               Vision       Perception     Praxis      Cognition Arousal/Alertness: Awake/alert Behavior During Therapy: WFL for tasks assessed/performed Overall Cognitive Status: Within Functional Limits for tasks assessed                                          Exercises Exercises: Other exercises Other Exercises Other Exercises: Educated  pt on donning HeadMaster Collar and checking for areas of pressure or skin break down. Need to further assess area around PICC line. Other Exercises: Began education on HEP for shoulder and scapular strengthening exercises.   Shoulder Instructions       General Comments      Pertinent Vitals/ Pain       Pain Assessment: No/denies pain  Home Living                                          Prior Functioning/Environment              Frequency  Min 2X/week        Progress Toward Goals  OT Goals(current goals can now be found in the care plan section)  Progress towards OT goals: Progressing toward goals  Acute Rehab OT Goals Patient Stated Goal: increase functional independence OT Goal Formulation: With patient  Plan Discharge plan remains appropriate    Co-evaluation                 AM-PAC PT "6 Clicks" Daily Activity     Outcome Measure   Help from another person eating meals?: None Help from another person taking care of personal grooming?: None Help from another person toileting, which includes using toliet, bedpan, or urinal?: None Help from another person bathing (including washing, rinsing, drying)?: None Help from another person to put on and taking off regular upper body clothing?: None Help from another person to put on and taking off regular lower body clothing?: None 6 Click Score: 24    End of Session    OT Visit Diagnosis: Other abnormalities of gait and mobility (R26.89);Muscle weakness (generalized) (M62.81)   Activity Tolerance Patient tolerated treatment well   Patient Left in chair;with call bell/phone within reach;with family/visitor present   Nurse Communication          Time: 8832-5498 OT Time Calculation (min): 25 min  Charges: OT General Charges $OT Visit: 1 Visit OT Treatments $Self Care/Home Management : 8-22 mins $Therapeutic Exercise: 8-22 mins  Natarsha Hurwitz A. Ulice Brilliant, M.S., OTR/L Pager:  [place] 07/23/2017, 4:36 PM

## 2017-07-23 NOTE — Progress Notes (Signed)
Pt is removed from a continue pulse oximeter.She refuse to put it on while sleeping.

## 2017-07-24 LAB — COMPREHENSIVE METABOLIC PANEL
ALK PHOS: 34 U/L — AB (ref 38–126)
ALT: 38 U/L (ref 14–54)
AST: 51 U/L — AB (ref 15–41)
Albumin: 3.7 g/dL (ref 3.5–5.0)
Anion gap: 14 (ref 5–15)
BUN: 15 mg/dL (ref 6–20)
CHLORIDE: 103 mmol/L (ref 101–111)
CO2: 23 mmol/L (ref 22–32)
Calcium: 9.2 mg/dL (ref 8.9–10.3)
Creatinine, Ser: 0.93 mg/dL (ref 0.44–1.00)
GFR, EST NON AFRICAN AMERICAN: 58 mL/min — AB (ref >60)
Glucose, Bld: 264 mg/dL — ABNORMAL HIGH (ref 65–99)
Potassium: 3.5 mmol/L (ref 3.5–5.1)
Sodium: 140 mmol/L (ref 135–145)
Total Bilirubin: 0.7 mg/dL (ref 0.3–1.2)
Total Protein: 5.4 g/dL — ABNORMAL LOW (ref 6.5–8.1)

## 2017-07-24 LAB — CBC
HCT: 37.6 % (ref 36.0–46.0)
Hemoglobin: 12.1 g/dL (ref 12.0–15.0)
MCH: 31.1 pg (ref 26.0–34.0)
MCHC: 32.2 g/dL (ref 30.0–36.0)
MCV: 96.7 fL (ref 78.0–100.0)
PLATELETS: 192 10*3/uL (ref 150–400)
RBC: 3.89 MIL/uL (ref 3.87–5.11)
RDW: 15 % (ref 11.5–15.5)
WBC: 9.9 10*3/uL (ref 4.0–10.5)

## 2017-07-24 MED ORDER — ACETAMINOPHEN 325 MG PO TABS
650.0000 mg | ORAL_TABLET | ORAL | Status: DC | PRN
  Administered 2017-07-24: 650 mg via ORAL

## 2017-07-24 MED ORDER — SODIUM CHLORIDE 0.9 % IV SOLN
2.0000 g | Freq: Once | INTRAVENOUS | Status: AC
  Administered 2017-07-24: 2 g via INTRAVENOUS
  Filled 2017-07-24: qty 20

## 2017-07-24 MED ORDER — HEPARIN SODIUM (PORCINE) 1000 UNIT/ML IJ SOLN
1000.0000 [IU] | Freq: Once | INTRAMUSCULAR | Status: DC
  Filled 2017-07-24: qty 1

## 2017-07-24 MED ORDER — CALCIUM CARBONATE ANTACID 500 MG PO CHEW
CHEWABLE_TABLET | ORAL | Status: AC
  Administered 2017-07-24: 400 mg
  Filled 2017-07-24: qty 2

## 2017-07-24 MED ORDER — ACD FORMULA A 0.73-2.45-2.2 GM/100ML VI SOLN
Status: AC
  Filled 2017-07-24: qty 500

## 2017-07-24 MED ORDER — ACD FORMULA A 0.73-2.45-2.2 GM/100ML VI SOLN
500.0000 mL | Status: DC
  Filled 2017-07-24: qty 500

## 2017-07-24 MED ORDER — CALCIUM CARBONATE ANTACID 500 MG PO CHEW
2.0000 | CHEWABLE_TABLET | ORAL | Status: AC
  Administered 2017-07-24: 400 mg via ORAL
  Filled 2017-07-24: qty 2

## 2017-07-24 MED ORDER — DIPHENHYDRAMINE HCL 25 MG PO CAPS: 25.0000 mg | ORAL_CAPSULE | Freq: Four times a day (QID) | ORAL | Status: DC | PRN

## 2017-07-24 MED ORDER — SODIUM CHLORIDE 0.9 % IV SOLN
INTRAVENOUS | Status: AC
  Administered 2017-07-24 (×3): via INTRAVENOUS_CENTRAL
  Filled 2017-07-24 (×3): qty 200

## 2017-07-24 NOTE — Progress Notes (Signed)
Occupational Therapy Treatment Patient Details Name: Marilyn [name] MRN: [id number] DOB: September 28, 1940 Today's Date: 07/24/2017    History of present illness 77 y.o. female admitted on 07/19/17 for progressive weakness associated with myesthenia gravis that was not responding to IVIG tx.  She is here acutely to receive plasmaphoresis treatments.  Pt with significant PMH of   OCD, cervical spine arthritis, HOH, migraine, low back pain, HTN, esophageal stricture, and breast CA R s/p lumpectomy.   OT comments  Session focused on molding of HeadMaster collar to avoid pressure near R IJ Triple-Lumen to maximize safety. OT molded and educated pt concerning need to monitor for skin breakdown with new fit of collar and she verbalized understanding. She is also planning to take collar with her to plasmaphoresis treatment today for staff to ensure that it is not interfering with catheter. Continued education concerning scapular strengthening HEP and pt continues to require cues and demonstration to complete successfully. Will continue to follow while admitted.    Follow Up Recommendations  Outpatient OT    Equipment Recommendations  None recommended by OT    Recommendations for Other Services      Precautions / Restrictions Precautions Precautions: None Required Braces or Orthoses: Other Brace/Splint Other Brace/Splint: HeadMaster Collar Restrictions Weight Bearing Restrictions: No       Mobility Bed Mobility Overal bed mobility: Independent                Transfers Overall transfer level: Independent                    Balance Overall balance assessment: Mild deficits observed, not formally tested                                         ADL either performed or assessed with clinical judgement   ADL                                         General ADL Comments: Session focused on HEP education as well as molding of HeadMaster Collar for  best fit to avoid pressure on R IJ temporary catheter. Discussed with RN present during session and molded to avoid contact with lines. Pt reports improvement in comfort level as well.      Vision       Perception     Praxis      Cognition Arousal/Alertness: Awake/alert Behavior During Therapy: WFL for tasks assessed/performed Overall Cognitive Status: Within Functional Limits for tasks assessed                                          Exercises Exercises: Other exercises Other Exercises Other Exercises: Educated pt concerning donning HeadMaster Collar once adjustments were made. Educated concerning checking for areas of pressure after 30 minutes on first use and she verbalizes understanding. Molded collar such that no contact with IJ triple lumen was made. Pt planning to discuss with dialysis technician to ensure that this is not aggravating her catheter.  Other Exercises: Continued education concerning HEP for shoulder and scapular strengthening.    Shoulder Instructions       General Comments      Pertinent Vitals/ Pain  Pain Assessment: No/denies pain  Home Living                                          Prior Functioning/Environment              Frequency  Min 2X/week        Progress Toward Goals  OT Goals(current goals can now be found in the care plan section)  Progress towards OT goals: Progressing toward goals  Acute Rehab OT Goals Patient Stated Goal: increase functional independence OT Goal Formulation: With patient Time For Goal Achievement: 08/03/17 Potential to Achieve Goals: Good  Plan Discharge plan remains appropriate    Co-evaluation                 AM-PAC PT "6 Clicks" Daily Activity     Outcome Measure   Help from another person eating meals?: None Help from another person taking care of personal grooming?: None Help from another person toileting, which includes using toliet, bedpan,  or urinal?: None Help from another person bathing (including washing, rinsing, drying)?: None Help from another person to put on and taking off regular upper body clothing?: None Help from another person to put on and taking off regular lower body clothing?: None 6 Click Score: 24    End of Session    OT Visit Diagnosis: Other abnormalities of gait and mobility (R26.89);Muscle weakness (generalized) (M62.81)   Activity Tolerance Patient tolerated treatment well   Patient Left (ambulating in room)   Nurse Communication Mobility status        Time: 6503-5465 OT Time Calculation (min): 72 min  Charges: OT General Charges $OT Visit: 1 Visit OT Treatments $Therapeutic Activity: 38-52 mins $Therapeutic Exercise: 8-22 mins $Orthotics/Prosthetics Check: 8-22 mins  Norman Herrlich, MS OTR/L  Pager: [place] [facility] 07/24/2017, 2:05 PM

## 2017-07-24 NOTE — Progress Notes (Signed)
PROGRESS NOTE    Marilyn [name]   BOF:[id number]  DOB: 1941-04-16  DOA: 07/19/2017 PCP: Gayland Curry, DO   Brief Narrative:  Marilyn [name] is a 77 y.o. female with medical history significant for breast cancer 11 years ago status post lumpectomy and radiation, OCD, hypothyroidism, hypertension, GERD and irritable bowel.  She has a new diagnosis of myasthenia gravis and has been on Prednisone with IVIG being given in the end of January without improvement in neurological symptoms. She is referred as a direct admit to start Plasmapheresis as inpatient. Presenting complaints include drooping of her head, poor visual acuity, diplopia and upper extremity weakness.  07/22/2017: There has been no significant change.  Patient is going for second plasmapheresis treatment today.  Neurology input is highly appreciated.  07/23/2017: Patient seen alongside still having friend.  Patient reported some improvement after plasmapheresis yesterday.  Patient also reports a what appears to be allergic reaction yesterday.  Etiology of the allergy is unknown to me.  Medical equipment team has provided patient with headmaster collar.  This seems to be helping the patient significantly to support her head.  No fever or chills, no headache, no neck pain, no shortness of breath.  07/24/2017: Patient seen.  No new complaints.  He will be going for plasmapheresis today.  No reported problems with head master neck collar.   Subjective: No new complaints. Reported some improvement, but still having head drops.  Assessment & Plan:   Principal Problem:   Myasthenia gravis with (acute) exacerbation  - HD temp cath placed last night - Neuro has consulted and ordered the plasmapheresis for 5 treatments every other day - cont Prednisone and Mestinon -Plasmapheresis is in progress without any complications.  Specifically, there is no bleeding. -For second plasmapheresis treatment today. -No significant change since  admission as per the patient. -Continue plasmapheresis for 9.  Active Problems:     Hypertension - she takes Cozaar for this -Continue to optimize.  Hair loss - Minoxidil, Flutamide  DVT prophylaxis: Lovenox Code Status: Full code Family Communication: Full code Disposition Plan:  Consultants:   neurology Procedures:   Temp dialysis cath Antimicrobials:  Anti-infectives (From admission, onward)   None       Objective: Vitals:   07/24/17 1629 07/24/17 1645 07/24/17 1658 07/24/17 1710  BP: (!) 105/48 (!) 106/53 (!) 119/50 (!) 122/54  Pulse: 73 71 76 78  Resp: 13 12 17 17   Temp:    97.7 F (36.5 C)  TempSrc:    Oral  SpO2:   99% 99%  Weight:      Height:        Intake/Output Summary (Last 24 hours) at 07/24/2017 1841 Last data filed at 07/24/2017 1253 Gross per 24 hour  Intake 120 ml  Output -  Net 120 ml   Filed Weights   07/23/17 0416 07/24/17 0613 07/24/17 1430  Weight: 76.5 kg (168 lb 10.4 oz) 70.6 kg (155 lb 9.6 oz) 70.6 kg (155 lb 10.3 oz)    Examination: General exam: Appears comfortable.  The patient is awake, alert and oriented to time, place and person. HEENT: PERRLA, oral mucosa moist, no sclera icterus or thrush- neck weakness noted.  Patient uses her upper extremities to support her chin. Respiratory system: Clear to auscultation. Respiratory effort normal. Cardiovascular system: S1 & S2 heard, RRR.  No murmurs  Gastrointestinal system: Abdomen soft, non-tender, nondistended. Normal bowel sound. No organomegaly Central nervous system: Alert and oriented. No focal neurological deficits.  Extremities: No cyanosis, clubbing or edema  Data Reviewed: I have personally reviewed following labs and imaging studies  CBC: Recent Labs  Lab 07/19/17 1150 07/20/17 0616 07/20/17 0641 07/20/17 1556 07/22/17 1359 07/24/17 1737  WBC 9.7 6.1 6.9  --  9.6 9.9  NEUTROABS 8.7*  --   --   --   --   --   HGB 13.0 11.8* 12.5 12.6 12.2 12.1  HCT 40.6 37.1  39.1 37.0 38.7 37.6  MCV 95.8 95.9 95.6  --  97.0 96.7  PLT 311 236 265  --  222 277   Basic Metabolic Panel: Recent Labs  Lab 07/19/17 1150 07/20/17 0616 07/20/17 1556 07/22/17 1359 07/22/17 1559 07/24/17 1737  NA 135 140 140 140  --  140  K 3.7 3.6 3.7 3.8  --  3.5  CL 100* 106 104 107  --  103  CO2 21* 23  --  23  --  23  GLUCOSE 98 81 266* 175*  --  264*  BUN 19 15 18 10   --  15  CREATININE 0.92 0.98 0.80 0.95  --  0.93  CALCIUM 9.2 8.6*  --  8.8* 8.4* 9.2  MG 2.1  --   --   --   --   --   PHOS 3.2  --   --   --   --   --    GFR: Estimated Creatinine Clearance: 49.9 mL/min (by C-G formula based on SCr of 0.93 mg/dL). Liver Function Tests: Recent Labs  Lab 07/19/17 1150 07/20/17 0616 07/22/17 1359 07/24/17 1737  AST 65* 57* 56* 51*  ALT 54 46 38 38  ALKPHOS 57 47 37* 34*  BILITOT 0.6 0.6 0.5 0.7  PROT 7.6 5.9* 5.9* 5.4*  ALBUMIN 3.5 2.8* 3.7 3.7   No results for input(s): LIPASE, AMYLASE in the last 168 hours. No results for input(s): AMMONIA in the last 168 hours. Coagulation Profile: Recent Labs  Lab 07/19/17 1150  INR 1.04   Cardiac Enzymes: No results for input(s): CKTOTAL, CKMB, CKMBINDEX, TROPONINI in the last 168 hours. BNP (last 3 results) No results for input(s): PROBNP in the last 8760 hours. HbA1C: No results for input(s): HGBA1C in the last 72 hours. CBG: No results for input(s): GLUCAP in the last 168 hours. Lipid Profile: No results for input(s): CHOL, HDL, LDLCALC, TRIG, CHOLHDL, LDLDIRECT in the last 72 hours. Thyroid Function Tests: No results for input(s): TSH, T4TOTAL, FREET4, T3FREE, THYROIDAB in the last 72 hours. Anemia Panel: No results for input(s): VITAMINB12, FOLATE, FERRITIN, TIBC, IRON, RETICCTPCT in the last 72 hours. Urine analysis: No results found for: COLORURINE, APPEARANCEUR, LABSPEC, PHURINE, GLUCOSEU, HGBUR, BILIRUBINUR, KETONESUR, PROTEINUR, UROBILINOGEN, NITRITE, LEUKOCYTESUR Sepsis  Labs: @LABRCNTIP (procalcitonin:4,lacticidven:4) )No results found for this or any previous visit (from the past 240 hour(s)).       Radiology Studies: No results found.    Scheduled Meds: . acidophilus  1 capsule Oral Daily  . enoxaparin (LOVENOX) injection  40 mg Subcutaneous Q24H  . flutamide  125 mg Oral BID  . heparin  1,000 Units Intracatheter Once  . heparin  1,000 Units Intracatheter Once  . heparin  1,000 Units Intracatheter Once  . losartan  50 mg Oral Daily  . minoxidil  2.5 mg Oral Daily  . predniSONE  60 mg Oral Q breakfast  . pyridostigmine  60 mg Oral Q12H  . vitamin C  500 mg Oral Daily   Continuous Infusions: . citrate dextrose    .  citrate dextrose    . citrate dextrose       LOS: 5 days    Time spent in minutes: 25    Bonnell Public, MD [facility] Pager: [phone]. [email or web address] Password [facility] 07/24/2017, 6:41 PM

## 2017-07-24 NOTE — Progress Notes (Signed)
Pt doing well post treatment, no side effects, itching or tongue swelling.

## 2017-07-24 NOTE — Progress Notes (Signed)
Pt has some concerns about her plasma treatment that is going to be today since she had some itching and some tongue swelling after the last treatment.  Dr. Marthenia Rolling texted.

## 2017-07-24 NOTE — Progress Notes (Signed)
After talking with Melissa in HD, she instructed me to give tylenol and benadryl prior to the HD procedure.

## 2017-07-25 NOTE — Progress Notes (Signed)
PROGRESS NOTE    Marilyn [name]   IPJ:[id number]  DOB: 1941/01/17  DOA: 07/19/2017 PCP: Gayland Curry, DO   Brief Narrative:  Marilyn [name] is a 77 y.o. female with medical history significant for breast cancer 11 years ago status post lumpectomy and radiation, OCD, hypothyroidism, hypertension, GERD and irritable bowel.  She has a new diagnosis of myasthenia gravis and has been on Prednisone with IVIG being given in the end of January without improvement in neurological symptoms. She is referred as a direct admit to start Plasmapheresis as inpatient. Presenting complaints include drooping of her head, poor visual acuity, diplopia and upper extremity weakness.  07/22/2017: There has been no significant change.  Patient is going for second plasmapheresis treatment today.  Neurology input is highly appreciated.  07/23/2017: Patient seen alongside still having friend.  Patient reported some improvement after plasmapheresis yesterday.  Patient also reports a what appears to be allergic reaction yesterday.  Etiology of the allergy is unknown to me.  Medical equipment team has provided patient with headmaster collar.  This seems to be helping the patient significantly to support her head.  No fever or chills, no headache, no neck pain, no shortness of breath.  07/24/2017: Patient seen.  No new complaints.  He will be going for plasmapheresis today.  No reported problems with head master neck collar.  07/25/2017: Patient seen alongside patient's sister, [name].  No new complaints.  Patient had 3rd plasmapheresis treatment yesterday without any complications.  The patient will have another plasmapheresis treatment tomorrow.  Neurology input is highly appreciated.  Otherwise, no new complaints.   Subjective: No new complaints. Reported some improvement, but still having head drops.  Assessment & Plan:   Principal Problem:   Myasthenia gravis with (acute) exacerbation  - HD temp cath placed last  night - Neuro has consulted and ordered the plasmapheresis for 5 treatments every other day - cont Prednisone and Mestinon -Plasmapheresis is in progress without any complications.  Specifically, there is no bleeding. -For second plasmapheresis treatment today. -No significant change since admission as per the patient. -Continue plasmapheresis for 9.  Active Problems:     Hypertension - she takes Cozaar for this -Continue to optimize.  Hair loss - Minoxidil, Flutamide  DVT prophylaxis: Lovenox Code Status: Full code Family Communication: Full code Disposition Plan:  Consultants:   neurology Procedures:   Temp dialysis cath Antimicrobials:  Anti-infectives (From admission, onward)   None       Objective: Vitals:   07/24/17 2207 07/25/17 0504 07/25/17 1136 07/25/17 1737  BP: (!) 156/65 140/69 (!) 135/51 (!) 129/46  Pulse: 73 71 69 75  Resp: 17 17 16    Temp: 97.6 F (36.4 C) 98.4 F (36.9 C) 98.1 F (36.7 C) 98.6 F (37 C)  TempSrc: Oral Oral Oral Oral  SpO2: 100% 96% 100% 96%  Weight:  77.1 kg (169 lb 15.6 oz)    Height:        Intake/Output Summary (Last 24 hours) at 07/25/2017 1746 Last data filed at 07/24/2017 2202 Gross per 24 hour  Intake 342 ml  Output -  Net 342 ml   Filed Weights   07/24/17 0613 07/24/17 1430 07/25/17 0504  Weight: 70.6 kg (155 lb 9.6 oz) 70.6 kg (155 lb 10.3 oz) 77.1 kg (169 lb 15.6 oz)    Examination: General exam: Appears comfortable.  The patient is awake, alert and oriented to time, place and person. HEENT: PERRLA, oral mucosa moist, no sclera icterus  or thrush- neck weakness noted.  Patient uses her upper extremities to support her chin. Respiratory system: Clear to auscultation. Respiratory effort normal. Cardiovascular system: S1 & S2 heard, RRR.  No murmurs  Gastrointestinal system: Abdomen soft, non-tender, nondistended. Normal bowel sound. No organomegaly Central nervous system: Alert and oriented. No focal  neurological deficits. Extremities: No cyanosis, clubbing or edema  Data Reviewed: I have personally reviewed following labs and imaging studies  CBC: Recent Labs  Lab 07/19/17 1150 07/20/17 0616 07/20/17 0641 07/20/17 1556 07/22/17 1359 07/24/17 1737  WBC 9.7 6.1 6.9  --  9.6 9.9  NEUTROABS 8.7*  --   --   --   --   --   HGB 13.0 11.8* 12.5 12.6 12.2 12.1  HCT 40.6 37.1 39.1 37.0 38.7 37.6  MCV 95.8 95.9 95.6  --  97.0 96.7  PLT 311 236 265  --  222 485   Basic Metabolic Panel: Recent Labs  Lab 07/19/17 1150 07/20/17 0616 07/20/17 1556 07/22/17 1359 07/22/17 1559 07/24/17 1737  NA 135 140 140 140  --  140  K 3.7 3.6 3.7 3.8  --  3.5  CL 100* 106 104 107  --  103  CO2 21* 23  --  23  --  23  GLUCOSE 98 81 266* 175*  --  264*  BUN 19 15 18 10   --  15  CREATININE 0.92 0.98 0.80 0.95  --  0.93  CALCIUM 9.2 8.6*  --  8.8* 8.4* 9.2  MG 2.1  --   --   --   --   --   PHOS 3.2  --   --   --   --   --    GFR: Estimated Creatinine Clearance: 52 mL/min (by C-G formula based on SCr of 0.93 mg/dL). Liver Function Tests: Recent Labs  Lab 07/19/17 1150 07/20/17 0616 07/22/17 1359 07/24/17 1737  AST 65* 57* 56* 51*  ALT 54 46 38 38  ALKPHOS 57 47 37* 34*  BILITOT 0.6 0.6 0.5 0.7  PROT 7.6 5.9* 5.9* 5.4*  ALBUMIN 3.5 2.8* 3.7 3.7   No results for input(s): LIPASE, AMYLASE in the last 168 hours. No results for input(s): AMMONIA in the last 168 hours. Coagulation Profile: Recent Labs  Lab 07/19/17 1150  INR 1.04   Cardiac Enzymes: No results for input(s): CKTOTAL, CKMB, CKMBINDEX, TROPONINI in the last 168 hours. BNP (last 3 results) No results for input(s): PROBNP in the last 8760 hours. HbA1C: No results for input(s): HGBA1C in the last 72 hours. CBG: No results for input(s): GLUCAP in the last 168 hours. Lipid Profile: No results for input(s): CHOL, HDL, LDLCALC, TRIG, CHOLHDL, LDLDIRECT in the last 72 hours. Thyroid Function Tests: No results for input(s):  TSH, T4TOTAL, FREET4, T3FREE, THYROIDAB in the last 72 hours. Anemia Panel: No results for input(s): VITAMINB12, FOLATE, FERRITIN, TIBC, IRON, RETICCTPCT in the last 72 hours. Urine analysis: No results found for: COLORURINE, APPEARANCEUR, LABSPEC, PHURINE, GLUCOSEU, HGBUR, BILIRUBINUR, KETONESUR, PROTEINUR, UROBILINOGEN, NITRITE, LEUKOCYTESUR Sepsis Labs: @LABRCNTIP (procalcitonin:4,lacticidven:4) )No results found for this or any previous visit (from the past 240 hour(s)).       Radiology Studies: No results found.    Scheduled Meds: . acidophilus  1 capsule Oral Daily  . enoxaparin (LOVENOX) injection  40 mg Subcutaneous Q24H  . flutamide  125 mg Oral BID  . heparin  1,000 Units Intracatheter Once  . heparin  1,000 Units Intracatheter Once  . heparin  1,000 Units  Intracatheter Once  . losartan  50 mg Oral Daily  . minoxidil  2.5 mg Oral Daily  . predniSONE  60 mg Oral Q breakfast  . pyridostigmine  60 mg Oral Q12H  . vitamin C  500 mg Oral Daily   Continuous Infusions: . citrate dextrose    . citrate dextrose       LOS: 6 days    Time spent in minutes: 25    Bonnell Public, MD [facility] Pager: [phone]. [email or web address] Password [facility] 07/25/2017, 5:46 PM

## 2017-07-25 NOTE — Progress Notes (Signed)
Occupational Therapy Treatment Patient Details Name: Marilyn [name] MRN: [id number] DOB: [birth year] Today's Date: 07/25/2017    History of present illness 77 y.o. female admitted on 07/19/17 for progressive weakness associated with myesthenia gravis that was not responding to IVIG tx.  She is here acutely to receive plasmaphoresis treatments.  Pt with significant PMH of   OCD, cervical spine arthritis, HOH, migraine, low back pain, HTN, esophageal stricture, and breast CA R s/p lumpectomy.   OT comments  Pt demonstrating good progress with HEP but continues to require cues for appropriate technique with scapular strengthening exercises. Provided further modification to HeadMaster Collar with optimal fit achieved to maintain safety while providing functional head position for IADL participation. Educated pt on wear schedule as detailed below. Will continue to follow.     Follow Up Recommendations  Outpatient OT    Equipment Recommendations  None recommended by OT    Recommendations for Other Services      Precautions / Restrictions Precautions Precautions: None Required Braces or Orthoses: Other Brace/Splint Other Brace/Splint: HeadMaster Collar Restrictions Weight Bearing Restrictions: No       Mobility Bed Mobility Overal bed mobility: Independent                Transfers Overall transfer level: Independent                    Balance Overall balance assessment: Mild deficits observed, not formally tested                                         ADL either performed or assessed with clinical judgement   ADL Overall ADL's : Needs assistance/impaired                                       General ADL Comments: Supervision overall. Simulated IADL tasks such as kitchen management and pt benfitted from support from HeadMaster collar to successfully place items overhead without being limited by head drop. Provided further  modification to splint to maximize safety with IJ triple lumen with optimal fit achieved today.      Vision   Additional Comments: Interacting with iphone without difficulty on my arrival.   Perception     Praxis      Cognition Arousal/Alertness: Awake/alert Behavior During Therapy: WFL for tasks assessed/performed Overall Cognitive Status: Within Functional Limits for tasks assessed                                          Exercises Other Exercises Other Exercises: Educated pt on HeadMaster collar wear schedule and she demonstrates understanding. Plan to wear when completing ambulating and standing ADL/IADL tasks but to remove with seated restful tasks to improve active control of scapular and cervical musculature.  Other Exercises: Pt continues to require cueing to correctly perform HEP for shoulder and scapular strengthening exercises. Sister present for education this date.    Shoulder Instructions       General Comments      Pertinent Vitals/ Pain       Pain Assessment: No/denies pain  Home Living  Prior Functioning/Environment              Frequency  Min 2X/week        Progress Toward Goals  OT Goals(current goals can now be found in the care plan section)  Progress towards OT goals: Progressing toward goals  Acute Rehab OT Goals Patient Stated Goal: increase functional independence OT Goal Formulation: With patient Time For Goal Achievement: 08/03/17 Potential to Achieve Goals: Good  Plan Discharge plan remains appropriate    Co-evaluation                 AM-PAC PT "6 Clicks" Daily Activity     Outcome Measure   Help from another person eating meals?: None Help from another person taking care of personal grooming?: None Help from another person toileting, which includes using toliet, bedpan, or urinal?: None Help from another person bathing (including washing,  rinsing, drying)?: None Help from another person to put on and taking off regular upper body clothing?: None Help from another person to put on and taking off regular lower body clothing?: None 6 Click Score: 24    End of Session    OT Visit Diagnosis: Other abnormalities of gait and mobility (R26.89);Muscle weakness (generalized) (M62.81)   Activity Tolerance Patient tolerated treatment well   Patient Left with call bell/phone within reach;with family/visitor present;in bed(seated at EOB)   Nurse Communication Mobility status        Time: 6387-5643 OT Time Calculation (min): 43 min  Charges: OT General Charges $OT Visit: 1 Visit OT Treatments $Self Care/Home Management : 8-22 mins $Therapeutic Exercise: 8-22 mins $Orthotics/Prosthetics Check: 8-22 mins  Norman Herrlich, MS OTR/L  Pager: [place] Marilyn [name] 07/25/2017, 2:00 PM

## 2017-07-26 ENCOUNTER — Telehealth: Payer: Self-pay | Admitting: Neurology

## 2017-07-26 LAB — COMPREHENSIVE METABOLIC PANEL
ALT: 30 U/L (ref 14–54)
AST: 42 U/L — AB (ref 15–41)
Albumin: 4.4 g/dL (ref 3.5–5.0)
Alkaline Phosphatase: 36 U/L — ABNORMAL LOW (ref 38–126)
Anion gap: 11 (ref 5–15)
BILIRUBIN TOTAL: 0.8 mg/dL (ref 0.3–1.2)
BUN: 15 mg/dL (ref 6–20)
CALCIUM: 9.4 mg/dL (ref 8.9–10.3)
CO2: 24 mmol/L (ref 22–32)
CREATININE: 0.88 mg/dL (ref 0.44–1.00)
Chloride: 105 mmol/L (ref 101–111)
Glucose, Bld: 148 mg/dL — ABNORMAL HIGH (ref 65–99)
Potassium: 4.1 mmol/L (ref 3.5–5.1)
Sodium: 140 mmol/L (ref 135–145)
TOTAL PROTEIN: 5.9 g/dL — AB (ref 6.5–8.1)

## 2017-07-26 LAB — POCT I-STAT, CHEM 8
BUN: 17 mg/dL (ref 6–20)
CALCIUM ION: 1.22 mmol/L (ref 1.15–1.40)
CHLORIDE: 101 mmol/L (ref 101–111)
Creatinine, Ser: 0.9 mg/dL (ref 0.44–1.00)
Glucose, Bld: 239 mg/dL — ABNORMAL HIGH (ref 65–99)
HEMATOCRIT: 40 % (ref 36.0–46.0)
Hemoglobin: 13.6 g/dL (ref 12.0–15.0)
POTASSIUM: 3.9 mmol/L (ref 3.5–5.1)
SODIUM: 140 mmol/L (ref 135–145)
TCO2: 24 mmol/L (ref 22–32)

## 2017-07-26 LAB — CBC
HEMATOCRIT: 39.6 % (ref 36.0–46.0)
Hemoglobin: 12.8 g/dL (ref 12.0–15.0)
MCH: 30.9 pg (ref 26.0–34.0)
MCHC: 32.3 g/dL (ref 30.0–36.0)
MCV: 95.7 fL (ref 78.0–100.0)
PLATELETS: 191 10*3/uL (ref 150–400)
RBC: 4.14 MIL/uL (ref 3.87–5.11)
RDW: 15.4 % (ref 11.5–15.5)
WBC: 14.1 10*3/uL — AB (ref 4.0–10.5)

## 2017-07-26 MED ORDER — CALCIUM CARBONATE ANTACID 500 MG PO CHEW
2.0000 | CHEWABLE_TABLET | ORAL | Status: AC
  Administered 2017-07-26: 400 mg via ORAL

## 2017-07-26 MED ORDER — SODIUM CHLORIDE 0.9 % IV SOLN
INTRAVENOUS | Status: AC
  Administered 2017-07-26 (×2): via INTRAVENOUS_CENTRAL
  Filled 2017-07-26 (×3): qty 200

## 2017-07-26 MED ORDER — DIPHENHYDRAMINE HCL 25 MG PO CAPS
25.0000 mg | ORAL_CAPSULE | Freq: Four times a day (QID) | ORAL | Status: DC | PRN
  Administered 2017-07-26 – 2017-07-28 (×2): 25 mg via ORAL
  Filled 2017-07-26: qty 1

## 2017-07-26 MED ORDER — DIPHENHYDRAMINE HCL 25 MG PO CAPS
ORAL_CAPSULE | ORAL | Status: AC
  Administered 2017-07-26: 25 mg via ORAL
  Filled 2017-07-26: qty 1

## 2017-07-26 MED ORDER — DIPHENHYDRAMINE HCL 25 MG PO CAPS: 25.0000 mg | ORAL_CAPSULE | Freq: Four times a day (QID) | ORAL | Status: DC | PRN

## 2017-07-26 MED ORDER — SODIUM CHLORIDE 0.9 % IV SOLN
2.0000 g | Freq: Once | INTRAVENOUS | Status: DC
  Filled 2017-07-26: qty 20

## 2017-07-26 MED ORDER — ACETAMINOPHEN 325 MG PO TABS
650.0000 mg | ORAL_TABLET | ORAL | Status: DC | PRN
  Filled 2017-07-26: qty 2

## 2017-07-26 MED ORDER — ACD FORMULA A 0.73-2.45-2.2 GM/100ML VI SOLN
Status: AC
  Filled 2017-07-26: qty 500

## 2017-07-26 MED ORDER — ACD FORMULA A 0.73-2.45-2.2 GM/100ML VI SOLN
500.0000 mL | Status: DC
  Filled 2017-07-26 (×2): qty 500

## 2017-07-26 MED ORDER — CALCIUM CARBONATE ANTACID 500 MG PO CHEW
CHEWABLE_TABLET | ORAL | Status: AC
  Administered 2017-07-26: 400 mg via ORAL
  Filled 2017-07-26: qty 4

## 2017-07-26 MED ORDER — HEPARIN SODIUM (PORCINE) 1000 UNIT/ML IJ SOLN
1000.0000 [IU] | Freq: Once | INTRAMUSCULAR | Status: DC
  Filled 2017-07-26: qty 1

## 2017-07-26 MED ORDER — ACD FORMULA A 0.73-2.45-2.2 GM/100ML VI SOLN
Status: AC
Start: 2017-07-26 — End: 2017-07-27
  Filled 2017-07-26: qty 500

## 2017-07-26 NOTE — Progress Notes (Signed)
PROGRESS NOTE    Marilyn [name]   IWP:[id number]  DOB: 07-08-1940  DOA: 07/19/2017 PCP: Gayland Curry, DO   Brief Narrative:  Marilyn [name] is a 77 y.o. female with medical history significant for breast cancer 11 years ago status post lumpectomy and radiation, OCD, hypothyroidism, hypertension, GERD and irritable bowel.  She has a new diagnosis of myasthenia gravis and has been on Prednisone with IVIG being given in the end of January without improvement in neurological symptoms. She is referred as a direct admit to start Plasmapheresis as inpatient. Presenting complaints include drooping of her head, poor visual acuity, diplopia and upper extremity weakness.  07/22/2017: There has been no significant change.  Patient is going for second plasmapheresis treatment today.  Neurology input is highly appreciated.  07/23/2017: Patient seen alongside still having friend.  Patient reported some improvement after plasmapheresis yesterday.  Patient also reports a what appears to be allergic reaction yesterday.  Etiology of the allergy is unknown to me.  Medical equipment team has provided patient with headmaster collar.  This seems to be helping the patient significantly to support her head.  No fever or chills, no headache, no neck pain, no shortness of breath.  07/24/2017: Patient seen.  No new complaints.  He will be going for plasmapheresis today.  No reported problems with head master neck collar.  07/25/2017: Patient seen alongside patient's sister, [name].  No new complaints.  Patient had 3rd plasmapheresis treatment yesterday without any complications.  The patient will have another plasmapheresis treatment tomorrow.  Neurology input is highly appreciated.  Otherwise, no new complaints.  07/26/2016: Patient was seen alongside patient's sister.  The patient's sister thinks that there has been some improvement.  Patient will be going for the fourth plasmapheresis today.   Subjective: No new  complaints. Reported some improvement, but still having head drops.  Assessment & Plan:   Principal Problem:   Myasthenia gravis with (acute) exacerbation  - HD temp cath placed last night - Neuro has consulted and ordered the plasmapheresis for 5 treatments every other day - cont Prednisone and Mestinon -Plasmapheresis is in progress without any complications.  Specifically, there is no bleeding. -For second plasmapheresis treatment today. -No significant change since admission as per the patient. -Continue plasmapheresis for now.  Active Problems:     Hypertension - she takes Cozaar for this -Continue to optimize.  Hair loss - Minoxidil, Flutamide  DVT prophylaxis: Lovenox Code Status: Full code Family Communication: Full code Disposition Plan:  Consultants:   neurology Procedures:   Temp dialysis cath Antimicrobials:  Anti-infectives (From admission, onward)   None       Objective: Vitals:   07/26/17 1445 07/26/17 1500 07/26/17 1519 07/26/17 1537  BP: (!) 147/87 133/63 124/75 123/62  Pulse:      Resp: 17 17 19 19   Temp: 98.8 F (37.1 C) 98.8 F (37.1 C) 98.8 F (37.1 C) 98.8 F (37.1 C)  TempSrc: Oral Oral Oral Oral  SpO2:    94%  Weight:      Height:       No intake or output data in the 24 hours ending 07/26/17 1823 Filed Weights   07/24/17 1430 07/25/17 0504 07/26/17 0437  Weight: 70.6 kg (155 lb 10.3 oz) 77.1 kg (169 lb 15.6 oz) 76.8 kg (169 lb 5 oz)    Examination: General exam: Appears comfortable.  The patient is awake, alert and oriented to time, place and person. HEENT: PERRLA, oral mucosa moist, no  sclera icterus or thrush- neck weakness noted.  Patient uses her upper extremities to support her chin. Respiratory system: Clear to auscultation. Respiratory effort normal. Cardiovascular system: S1 & S2 heard, RRR.  No murmurs  Gastrointestinal system: Abdomen soft, non-tender, nondistended. Normal bowel sound. No organomegaly Central  nervous system: Alert and oriented. No focal neurological deficits. Extremities: No cyanosis, clubbing or edema  Data Reviewed: I have personally reviewed following labs and imaging studies  CBC: Recent Labs  Lab 07/20/17 0616 07/20/17 0641 07/20/17 1556 07/22/17 1359 07/24/17 1438 07/24/17 1737 07/26/17 1134  WBC 6.1 6.9  --  9.6  --  9.9 14.1*  HGB 11.8* 12.5 12.6 12.2 13.6 12.1 12.8  HCT 37.1 39.1 37.0 38.7 40.0 37.6 39.6  MCV 95.9 95.6  --  97.0  --  96.7 95.7  PLT 236 265  --  222  --  192 962   Basic Metabolic Panel: Recent Labs  Lab 07/20/17 0616 07/20/17 1556 07/22/17 1359 07/22/17 1559 07/24/17 1438 07/24/17 1737 07/26/17 1134  NA 140 140 140  --  140 140 140  K 3.6 3.7 3.8  --  3.9 3.5 4.1  CL 106 104 107  --  101 103 105  CO2 23  --  23  --   --  23 24  GLUCOSE 81 266* 175*  --  239* 264* 148*  BUN 15 18 10   --  17 15 15   CREATININE 0.98 0.80 0.95  --  0.90 0.93 0.88  CALCIUM 8.6*  --  8.8* 8.4*  --  9.2 9.4   GFR: Estimated Creatinine Clearance: 54.9 mL/min (by C-G formula based on SCr of 0.88 mg/dL). Liver Function Tests: Recent Labs  Lab 07/20/17 0616 07/22/17 1359 07/24/17 1737 07/26/17 1134  AST 57* 56* 51* 42*  ALT 46 38 38 30  ALKPHOS 47 37* 34* 36*  BILITOT 0.6 0.5 0.7 0.8  PROT 5.9* 5.9* 5.4* 5.9*  ALBUMIN 2.8* 3.7 3.7 4.4   No results for input(s): LIPASE, AMYLASE in the last 168 hours. No results for input(s): AMMONIA in the last 168 hours. Coagulation Profile: No results for input(s): INR, PROTIME in the last 168 hours. Cardiac Enzymes: No results for input(s): CKTOTAL, CKMB, CKMBINDEX, TROPONINI in the last 168 hours. BNP (last 3 results) No results for input(s): PROBNP in the last 8760 hours. HbA1C: No results for input(s): HGBA1C in the last 72 hours. CBG: No results for input(s): GLUCAP in the last 168 hours. Lipid Profile: No results for input(s): CHOL, HDL, LDLCALC, TRIG, CHOLHDL, LDLDIRECT in the last 72  hours. Thyroid Function Tests: No results for input(s): TSH, T4TOTAL, FREET4, T3FREE, THYROIDAB in the last 72 hours. Anemia Panel: No results for input(s): VITAMINB12, FOLATE, FERRITIN, TIBC, IRON, RETICCTPCT in the last 72 hours. Urine analysis: No results found for: COLORURINE, APPEARANCEUR, LABSPEC, PHURINE, GLUCOSEU, HGBUR, BILIRUBINUR, KETONESUR, PROTEINUR, UROBILINOGEN, NITRITE, LEUKOCYTESUR Sepsis Labs: @LABRCNTIP (procalcitonin:4,lacticidven:4) )No results found for this or any previous visit (from the past 240 hour(s)).       Radiology Studies: No results found.    Scheduled Meds: . acidophilus  1 capsule Oral Daily  . enoxaparin (LOVENOX) injection  40 mg Subcutaneous Q24H  . flutamide  125 mg Oral BID  . heparin  1,000 Units Intracatheter Once  . losartan  50 mg Oral Daily  . minoxidil  2.5 mg Oral Daily  . predniSONE  60 mg Oral Q breakfast  . pyridostigmine  60 mg Oral Q12H  . vitamin C  500  mg Oral Daily   Continuous Infusions: . calcium gluconate IVPB    . citrate dextrose    . citrate dextrose       LOS: 7 days    Time spent in minutes: 25    Bonnell Public, MD [facility] Pager: [phone]. [email or web address] Password [facility] 07/26/2017, 6:23 PM

## 2017-07-26 NOTE — Progress Notes (Addendum)
Subjective: No significant complaints this morning.  Exam: Vitals:   07/25/17 2157 07/26/17 0437  BP: (!) 134/56 (!) 142/64  Pulse: 73 72  Resp: 17 17  Temp: 97.7 F (36.5 C) 98.4 F (36.9 C)  SpO2: 98% 97%    Physical Exam   HEENT-  Normocephalic, no lesions, without obvious abnormality.  Normal external eye and conjunctiva.   Cardiovascular- S1-S2 audible, pulses palpable throughout   Lungs-no rhonchi or wheezing noted, no excessive working breathing.  Saturations within normal limits Abdomen- All 4 quadrants palpated and nontender Extremities- Warm, dry and intact Musculoskeletal-no joint tenderness, deformity or swelling Skin-warm and dry, no hyperpigmentation, vitiligo, or suspicious lesions    Neuro:  Mental Status: Alert, oriented, thought content appropriate.  Speech fluent without evidence of aphasia.  Able to follow 3 step commands without difficulty. Cranial Nerves: II:  Visual fields grossly normal,  III,IV, VI: ptosis not present, extra-ocular motions intact bilaterally pupils equal, round, reactive to light and accommodation V,VII: smile symmetric, facial light touch sensation normal bilaterally VIII: hearing normal bilaterally IX,X: uvula rises symmetrically--although has limited motion XI: bilateral shoulder shrug XII: midline tongue extension Motor: Right : Upper extremity   5/5    Left:     Upper extremity   5/5  Lower extremity   5/5     Lower extremity   5/5 --Continues to have some weakness with neck extension however this is improved significantly and is now able to hold her head erect without using any pillow or DME.  Has some posterior trapezial and neck increased tone-this could be from holding her neck up in the constricted way.  Have physical therapy evaluate. Tone and bulk:normal tone throughout; no atrophy noted Sensory: Pinprick and light touch intact throughout, bilaterally Deep Tendon Reflexes: 2+ and symmetric throughout Plantars: Right:  downgoing   Left: downgoing Cerebellar: normal finger-to-nose,and normal heel-to-shin test Gait: normal gait and station    Medications:  Scheduled: . acidophilus  1 capsule Oral Daily  . calcium carbonate      . calcium carbonate  2 tablet Oral Q3H  . enoxaparin (LOVENOX) injection  40 mg Subcutaneous Q24H  . flutamide  125 mg Oral BID  . heparin  1,000 Units Intracatheter Once  . losartan  50 mg Oral Daily  . minoxidil  2.5 mg Oral Daily  . predniSONE  60 mg Oral Q breakfast  . pyridostigmine  60 mg Oral Q12H  . vitamin C  500 mg Oral Daily    Pertinent Labs/Diagnostics: None  No results found.   Etta Quill PA-C [facility] [phone]  Impression: This 77 year old female with severe neck myasthenic gravis status post 3/5 treatments of PLEX.  At this point it is still unclear if this is myasthenia gravis that we are treating.  Have sent for blood tests to rule out Idamae Schuller variant of Guillain-Barr--although very less likely secondary to intact reflexes.  Had an extensive conversation with patient and friend about continuing her PLEX treatment, following up with Dr. Posey Pronto and then evaluated the need to go to a tertiary center.  Patient and friend completely understood.   Recommendations: 1) continue with her current PLEX treatment, prednisone, Mestinon.  We will continue to follow  07/26/2017, 12:18 PM   Attending Neurohospitalist Addendum Patient seen and examined with APP/Resident. Agree with the history and physical as documented above. Agree with the plan as documented, which I helped formulate. I have independently reviewed the chart, obtained history, review of systems and examined the  patient.I have personally reviewed pertinent head/neck/spine imaging (CT/MRI). MRI C-spine 05/15/17 - no acute changes. Multilevel DJD.  I spoke in detail with the patient and her sister at bedside.  I answered all their questions. Plan as above-complete 5 days of  plasma exchange, continue with prednisone and Mestinon and follow with Dr. Posey Pronto as an outpatient.  As far as the question of having second opinion/transfer to [facility] or [facility], I will defer that to Dr. Posey Pronto. Agree with checking GQ 1B although absence of ataxia/abdominal paresis/areflexia goes against Idamae Schuller variant.  We will follow with you after the last plasma exchange treatment.  Please call us with questions.  I spent a total of 45 minutes in the care of this patient including history, examination, reviewing labs and imaging, and discussing the care with the patient and her sister at length at bedside.  --- Amie Portland, MD [facility] Pager: [phone]  If 7pm to 7am, please call on call as listed on AMION.

## 2017-07-26 NOTE — Care Management Important Message (Signed)
Important Message  Patient Details  Name: Marilyn [name] MRN: [id number] Date of Birth: 10/31/1940   Medicare Important Message Given:  Yes    [name] 07/26/2017, 1:08 PM

## 2017-07-26 NOTE — Telephone Encounter (Signed)
Patient needs to talk to someone about her care please call [phone] or [phone]

## 2017-07-27 ENCOUNTER — Ambulatory Visit: Payer: Medicare Other | Admitting: Neurology

## 2017-07-27 LAB — POCT I-STAT, CHEM 8
BUN: 15 mg/dL (ref 6–20)
CHLORIDE: 102 mmol/L (ref 101–111)
CREATININE: 0.8 mg/dL (ref 0.44–1.00)
Calcium, Ion: 1.22 mmol/L (ref 1.15–1.40)
Glucose, Bld: 140 mg/dL — ABNORMAL HIGH (ref 65–99)
HEMATOCRIT: 36 % (ref 36.0–46.0)
Hemoglobin: 12.2 g/dL (ref 12.0–15.0)
POTASSIUM: 4 mmol/L (ref 3.5–5.1)
SODIUM: 141 mmol/L (ref 135–145)
TCO2: 25 mmol/L (ref 22–32)

## 2017-07-27 NOTE — Telephone Encounter (Signed)
I called patient back and she would like to know when Dr. Posey Pronto would like to see her back.   Dr. Posey Pronto will see her on 2-28 at 11:00.  [name] (sister) and patient notified.

## 2017-07-27 NOTE — Progress Notes (Addendum)
PT Cancellation Note  Patient Details Name: Marilyn [name] MRN: [id number] DOB: 20-Nov-1940   Cancelled Treatment:    Reason Eval/Treat Not Completed: Other (comment).  See PT evaluation dated 07/20/17.  Pt is improving and not declining, so no new acute therapy evaluation warranted at this time.  OT is following for scapular strengthening and cervical brace management.  Pt does not need acute physical therapy and OT is recommending OP OT follow up at discharge.  I did call the RN to make sure pt is working with the mobility tech for hallway ambulation as I originally suggested.  Thanks,    Barbarann Ehlers. [place], [place], DPT [phone]   07/27/2017, 1:26 PM

## 2017-07-27 NOTE — Progress Notes (Signed)
PROGRESS NOTE    Marilyn [name]   VFI:[id number]  DOB: March 24, 1941  DOA: 07/19/2017 PCP: Gayland Curry, DO   Brief Narrative:  Marilyn [name] is a 77 y.o. female with medical history significant for breast cancer 11 years ago status post lumpectomy and radiation, OCD, hypothyroidism, hypertension, GERD and irritable bowel.  She has a new diagnosis of myasthenia gravis and has been on Prednisone with IVIG being given in the end of January without improvement in neurological symptoms. She is referred as a direct admit to start Plasmapheresis as inpatient. Presenting complaints include drooping of her head, poor visual acuity, diplopia and upper extremity weakness.  07/22/2017: There has been no significant change.  Patient is going for second plasmapheresis treatment today.  Neurology input is highly appreciated.  07/23/2017: Patient seen alongside still having friend.  Patient reported some improvement after plasmapheresis yesterday.  Patient also reports a what appears to be allergic reaction yesterday.  Etiology of the allergy is unknown to me.  Medical equipment team has provided patient with headmaster collar.  This seems to be helping the patient significantly to support her head.  No fever or chills, no headache, no neck pain, no shortness of breath.  07/24/2017: Patient seen.  No new complaints.  He will be going for plasmapheresis today.  No reported problems with head master neck collar.  07/25/2017: Patient seen alongside patient's sister, [name].  No new complaints.  Patient had 3rd plasmapheresis treatment yesterday without any complications.  The patient will have another plasmapheresis treatment tomorrow.  Neurology input is highly appreciated.  Otherwise, no new complaints.  07/26/2016: Patient was seen alongside patient's sister.  The patient's sister thinks that there has been some improvement.  Patient will be going for the fourth plasmapheresis today.  07/27/2017: Patient seen.   Patient continues to improve.  Plan is for the fifth plasmapheresis tomorrow.  According to the patient, the neurologist wants to observe her for 24 hours after the last plasmapheresis.  Patient will need to follow with her primary neurologist, Dr. Posey Pronto on discharge.  No new complaints.   Subjective: No new complaints. Reported some improvement.  Assessment & Plan:   Principal Problem:   Myasthenia gravis with (acute) exacerbation  - HD temp cath placed last night - Neuro has consulted and ordered the plasmapheresis for 5 treatments every other day - cont Prednisone and Mestinon -Plasmapheresis is in progress without any complications.  Specifically, there is no bleeding. -For second plasmapheresis treatment today. -No significant change since admission as per the patient. -Continue plasmapheresis for now. -Fifth and final planned plasmapheresis is for tomorrow, 07/28/2017.  Active Problems:     Hypertension - she takes Cozaar for this -Continue to optimize.  Hair loss - Minoxidil, Flutamide  DVT prophylaxis: Lovenox Code Status: Full code Family Communication: Full code Disposition Plan:  Consultants:   neurology Procedures:   Temp dialysis cath Antimicrobials:  Anti-infectives (From admission, onward)   None       Objective: Vitals:   07/27/17 0630 07/27/17 0843 07/27/17 1200 07/27/17 1345  BP: (!) 161/68 (!) 150/67 (!) 147/61 (!) 154/74  Pulse: 75 75 74 88  Resp: 19 19 18  (!) 21  Temp: 98.6 F (37 C) 98.5 F (36.9 C) 98.8 F (37.1 C) 97.6 F (36.4 C)  TempSrc: Oral Oral Oral Oral  SpO2:  99% 98% 99%  Weight:      Height:        Intake/Output Summary (Last 24 hours) at 07/27/2017  [place] filed at 07/27/2017 1345 Gross per 24 hour  Intake 700 ml  Output -  Net 700 ml   Filed Weights   07/25/17 0504 07/26/17 0437 07/27/17 0500  Weight: 77.1 kg (169 lb 15.6 oz) 76.8 kg (169 lb 5 oz) 76.8 kg (169 lb 5 oz)    Examination: General exam:  Appears comfortable.  The patient is awake, alert and oriented to time, place and person. HEENT: PERRLA, oral mucosa moist, no sclera icterus or thrush- neck weakness noted.  Patient uses her upper extremities to support her chin. Respiratory system: Clear to auscultation. Respiratory effort normal. Cardiovascular system: S1 & S2 heard, RRR.  No murmurs  Gastrointestinal system: Abdomen soft, non-tender, nondistended. Normal bowel sound. No organomegaly Central nervous system: Alert and oriented. No focal neurological deficits. Extremities: No cyanosis, clubbing or edema  Data Reviewed: I have personally reviewed following labs and imaging studies  CBC: Recent Labs  Lab 07/22/17 1359 07/24/17 1438 07/24/17 1737 07/26/17 1134 07/26/17 1335  WBC 9.6  --  9.9 14.1*  --   HGB 12.2 13.6 12.1 12.8 12.2  HCT 38.7 40.0 37.6 39.6 36.0  MCV 97.0  --  96.7 95.7  --   PLT 222  --  192 191  --    Basic Metabolic Panel: Recent Labs  Lab 07/22/17 1359 07/22/17 1559 07/24/17 1438 07/24/17 1737 07/26/17 1134 07/26/17 1335  NA 140  --  140 140 140 141  K 3.8  --  3.9 3.5 4.1 4.0  CL 107  --  101 103 105 102  CO2 23  --   --  23 24  --   GLUCOSE 175*  --  239* 264* 148* 140*  BUN 10  --  17 15 15 15   CREATININE 0.95  --  0.90 0.93 0.88 0.80  CALCIUM 8.8* 8.4*  --  9.2 9.4  --    GFR: Estimated Creatinine Clearance: 60.3 mL/min (by C-G formula based on SCr of 0.8 mg/dL). Liver Function Tests: Recent Labs  Lab 07/22/17 1359 07/24/17 1737 07/26/17 1134  AST 56* 51* 42*  ALT 38 38 30  ALKPHOS 37* 34* 36*  BILITOT 0.5 0.7 0.8  PROT 5.9* 5.4* 5.9*  ALBUMIN 3.7 3.7 4.4   No results for input(s): LIPASE, AMYLASE in the last 168 hours. No results for input(s): AMMONIA in the last 168 hours. Coagulation Profile: No results for input(s): INR, PROTIME in the last 168 hours. Cardiac Enzymes: No results for input(s): CKTOTAL, CKMB, CKMBINDEX, TROPONINI in the last 168 hours. BNP (last 3  results) No results for input(s): PROBNP in the last 8760 hours. HbA1C: No results for input(s): HGBA1C in the last 72 hours. CBG: No results for input(s): GLUCAP in the last 168 hours. Lipid Profile: No results for input(s): CHOL, HDL, LDLCALC, TRIG, CHOLHDL, LDLDIRECT in the last 72 hours. Thyroid Function Tests: No results for input(s): TSH, T4TOTAL, FREET4, T3FREE, THYROIDAB in the last 72 hours. Anemia Panel: No results for input(s): VITAMINB12, FOLATE, FERRITIN, TIBC, IRON, RETICCTPCT in the last 72 hours. Urine analysis: No results found for: COLORURINE, APPEARANCEUR, LABSPEC, PHURINE, GLUCOSEU, HGBUR, BILIRUBINUR, KETONESUR, PROTEINUR, UROBILINOGEN, NITRITE, LEUKOCYTESUR Sepsis Labs: @LABRCNTIP (procalcitonin:4,lacticidven:4) )No results found for this or any previous visit (from the past 240 hour(s)).       Radiology Studies: No results found.    Scheduled Meds: . acidophilus  1 capsule Oral Daily  . enoxaparin (LOVENOX) injection  40 mg Subcutaneous Q24H  . flutamide  125 mg  Oral BID  . heparin  1,000 Units Intracatheter Once  . losartan  50 mg Oral Daily  . minoxidil  2.5 mg Oral Daily  . predniSONE  60 mg Oral Q breakfast  . pyridostigmine  60 mg Oral Q12H  . vitamin C  500 mg Oral Daily   Continuous Infusions: . calcium gluconate IVPB    . citrate dextrose       LOS: 8 days    Time spent in minutes: 25    Bonnell Public, MD [facility] Pager: [phone]. [email or web address] Password [facility] 07/27/2017, 3:43 PM

## 2017-07-27 NOTE — Progress Notes (Signed)
Occupational Therapy Treatment Patient Details Name: Marilyn [name] MRN: [id number] DOB: 1941-04-07 Today's Date: 07/27/2017    History of present illness 77 y.o. female admitted on 07/19/17 for progressive weakness associated with myesthenia gravis that was not responding to IVIG tx.  She is here acutely to receive plasmaphoresis treatments.  Pt with significant PMH of   OCD, cervical spine arthritis, HOH, migraine, low back pain, HTN, esophageal stricture, and breast CA R s/p lumpectomy.   OT comments  Pt continuing to demonstrate difficulty with self-feeding tasks and re-enforced education concerning need for proximal R UE support in order to control R hand for fine motor tasks. Pt reports understanding. Continued education concerning brace wear and appropriate methods for donning as pt with difficulty properly positioning chin on collar resulting in some pressure near IJ catheter. She demonstrates ability to complete HEP without assistance today and is progressing. Continue to recommend outpatient OT services. Will re-assess collar after catheter is removed.    Follow Up Recommendations  Outpatient OT    Equipment Recommendations  None recommended by OT    Recommendations for Other Services      Precautions / Restrictions Precautions Precautions: None Required Braces or Orthoses: Other Brace/Splint Other Brace/Splint: HeadMaster Collar Restrictions Weight Bearing Restrictions: No       Mobility Bed Mobility Overal bed mobility: Independent                Transfers                      Balance                                           ADL either performed or assessed with clinical judgement   ADL Overall ADL's : Needs assistance/impaired                                       General ADL Comments: Pt reports that she was planning on having RN call OT (however, OT to room before she was able to ask) as she is having trouble  donning HeadMaster without discomfort with IJ catheter. Continued education concerning positioning of collar and method to don properly. Pt able to demonstrate proper positioning but requires cues. Pt continues to report poor ability to complete self-feeding without spilling. Re-enforced education concerning positioning with R UE support to improve proximal stability and in turn distal control.      Vision       Perception     Praxis      Cognition Arousal/Alertness: Awake/alert Behavior During Therapy: WFL for tasks assessed/performed Overall Cognitive Status: Within Functional Limits for tasks assessed                                          Exercises Other Exercises Other Exercises: Finalized education concerning HEP for scapular strengthening. Pt able to complete with handout this session and no assistance.    Shoulder Instructions       General Comments      Pertinent Vitals/ Pain       Pain Assessment: No/denies pain  Home Living  Prior Functioning/Environment              Frequency  Min 3X/week        Progress Toward Goals  OT Goals(current goals can now be found in the care plan section)  Progress towards OT goals: Progressing toward goals  Acute Rehab OT Goals Patient Stated Goal: increase functional independence OT Goal Formulation: With patient Time For Goal Achievement: 08/03/17 Potential to Achieve Goals: Good  Plan Discharge plan remains appropriate;Frequency needs to be updated    Co-evaluation                 AM-PAC PT "6 Clicks" Daily Activity     Outcome Measure   Help from another person eating meals?: None Help from another person taking care of personal grooming?: None Help from another person toileting, which includes using toliet, bedpan, or urinal?: None Help from another person bathing (including washing, rinsing, drying)?: None Help from  another person to put on and taking off regular upper body clothing?: None Help from another person to put on and taking off regular lower body clothing?: None 6 Click Score: 24    End of Session    OT Visit Diagnosis: Other abnormalities of gait and mobility (R26.89);Muscle weakness (generalized) (M62.81)   Activity Tolerance Patient tolerated treatment well   Patient Left with call bell/phone within reach(ambulating in room)   Nurse Communication          Time: 5993-5701 OT Time Calculation (min): 22 min  Charges: OT General Charges $OT Visit: 1 Visit OT Treatments $Therapeutic Activity: 8-22 mins  Norman Herrlich, MS OTR/L  Pager: [place] 07/27/2017, 4:37 PM

## 2017-07-28 LAB — POCT I-STAT, CHEM 8
BUN: 12 mg/dL (ref 6–20)
CALCIUM ION: 1.25 mmol/L (ref 1.15–1.40)
CHLORIDE: 101 mmol/L (ref 101–111)
Creatinine, Ser: 0.8 mg/dL (ref 0.44–1.00)
GLUCOSE: 148 mg/dL — AB (ref 65–99)
HCT: 38 % (ref 36.0–46.0)
Hemoglobin: 12.9 g/dL (ref 12.0–15.0)
Potassium: 3.8 mmol/L (ref 3.5–5.1)
SODIUM: 140 mmol/L (ref 135–145)
TCO2: 25 mmol/L (ref 22–32)

## 2017-07-28 LAB — CBC
HEMATOCRIT: 32.7 % — AB (ref 36.0–46.0)
Hemoglobin: 10.7 g/dL — ABNORMAL LOW (ref 12.0–15.0)
MCH: 31.7 pg (ref 26.0–34.0)
MCHC: 32.7 g/dL (ref 30.0–36.0)
MCV: 96.7 fL (ref 78.0–100.0)
PLATELETS: 154 10*3/uL (ref 150–400)
RBC: 3.38 MIL/uL — AB (ref 3.87–5.11)
RDW: 15.7 % — AB (ref 11.5–15.5)
WBC: 10.9 10*3/uL — AB (ref 4.0–10.5)

## 2017-07-28 LAB — COMPREHENSIVE METABOLIC PANEL
ALBUMIN: 4.1 g/dL (ref 3.5–5.0)
ALK PHOS: 37 U/L — AB (ref 38–126)
ALT: 22 U/L (ref 14–54)
ANION GAP: 10 (ref 5–15)
AST: 31 U/L (ref 15–41)
BILIRUBIN TOTAL: 0.7 mg/dL (ref 0.3–1.2)
BUN: 11 mg/dL (ref 6–20)
CALCIUM: 9.4 mg/dL (ref 8.9–10.3)
CO2: 25 mmol/L (ref 22–32)
Chloride: 104 mmol/L (ref 101–111)
Creatinine, Ser: 0.9 mg/dL (ref 0.44–1.00)
GFR calc non Af Amer: >60 mL/min (ref >60)
GLUCOSE: 157 mg/dL — AB (ref 65–99)
POTASSIUM: 3.8 mmol/L (ref 3.5–5.1)
SODIUM: 139 mmol/L (ref 135–145)
TOTAL PROTEIN: 5.8 g/dL — AB (ref 6.5–8.1)

## 2017-07-28 MED ORDER — SODIUM CHLORIDE 0.9 % IV SOLN
INTRAVENOUS | Status: AC
  Administered 2017-07-28 (×3): via INTRAVENOUS_CENTRAL
  Filled 2017-07-28 (×3): qty 200

## 2017-07-28 MED ORDER — ACD FORMULA A 0.73-2.45-2.2 GM/100ML VI SOLN
Status: AC
  Administered 2017-07-28: 500 mL via INTRAVENOUS
  Filled 2017-07-28: qty 500

## 2017-07-28 MED ORDER — SODIUM CHLORIDE 0.9 % IV SOLN
2.0000 g | Freq: Once | INTRAVENOUS | Status: AC
  Administered 2017-07-28: 2 g via INTRAVENOUS
  Filled 2017-07-28 (×3): qty 20

## 2017-07-28 MED ORDER — SODIUM CHLORIDE 0.9 % IV SOLN: Freq: Once | INTRAVENOUS | Status: DC

## 2017-07-28 MED ORDER — ACETAMINOPHEN 325 MG PO TABS: 650.0000 mg | ORAL_TABLET | ORAL | Status: DC | PRN

## 2017-07-28 MED ORDER — CALCIUM CARBONATE ANTACID 500 MG PO CHEW
2.0000 | CHEWABLE_TABLET | ORAL | Status: AC
  Administered 2017-07-28 (×2): 400 mg via ORAL
  Filled 2017-07-28: qty 2

## 2017-07-28 MED ORDER — ACD FORMULA A 0.73-2.45-2.2 GM/100ML VI SOLN
500.0000 mL | Status: DC
  Administered 2017-07-28: 500 mL via INTRAVENOUS
  Filled 2017-07-28 (×2): qty 500

## 2017-07-28 MED ORDER — ACD FORMULA A 0.73-2.45-2.2 GM/100ML VI SOLN
Status: AC
  Filled 2017-07-28: qty 500

## 2017-07-28 MED ORDER — CALCIUM CARBONATE ANTACID 500 MG PO CHEW
CHEWABLE_TABLET | ORAL | Status: AC
  Administered 2017-07-28: 400 mg via ORAL
  Filled 2017-07-28: qty 4

## 2017-07-28 MED ORDER — DIPHENHYDRAMINE HCL 25 MG PO CAPS: 25.0000 mg | ORAL_CAPSULE | Freq: Four times a day (QID) | ORAL | Status: DC | PRN

## 2017-07-28 MED ORDER — HEPARIN SODIUM (PORCINE) 1000 UNIT/ML IJ SOLN
1000.0000 [IU] | Freq: Once | INTRAMUSCULAR | Status: DC
  Filled 2017-07-28: qty 1

## 2017-07-28 NOTE — Progress Notes (Signed)
Subjective: Patient has no complaints today.  States there is possibility that may be she felt the slight weakness in her neck but nothing significant.  Today is her last day of PLEX.   Exam: Vitals:   07/28/17 0508 07/28/17 0730  BP: 140/68 90/70  Pulse: 88 (!) 142  Resp: 15 (!) 36  Temp: 98.4 F (36.9 C) 98.4 F (36.9 C)  SpO2: 99% 98%    Physical Exam   HEENT-  Normocephalic, no lesions, without obvious abnormality.  Normal external eye and conjunctiva.   Cardiovascular- S1-S2 audible, pulses palpable throughout   Lungs-no rhonchi or wheezing noted, no excessive working breathing.  Saturations within normal limits Abdomen- All 4 quadrants palpated and nontender Extremities- Warm, dry and intact Musculoskeletal-no joint tenderness, deformity or swelling Skin-warm and dry, no hyperpigmentation, vitiligo, or suspicious lesions    Neuro:  Mental Status: Alert, oriented, thought content appropriate.  Speech fluent without evidence of aphasia.  Able to follow 3 step commands without difficulty. Cranial Nerves: II: Discs flat bilaterally; Visual fields grossly normal,  III,IV, VI: ptosis not present, extra-ocular motions intact bilaterally pupils equal, round, reactive to light and accommodation V,VII: smile symmetric, facial light touch sensation normal bilaterally VIII: hearing normal bilaterally IX,X: uvula rises symmetrically XI: bilateral shoulder shrug XII: midline tongue extension Motor: Right : Upper extremity   5/5    Left:     Upper extremity   5/5  Lower extremity   5/5     Lower extremity   5/5 -Continued weakness in neck extension however as stated previously significantly improved -Continues to have some increased tone in the posterior aspect of her neck most likely due to strain Sensory: Pinprick and light touch intact throughout, bilaterally      Medications:  Scheduled: . acidophilus  1 capsule Oral Daily  . calcium carbonate  2 tablet Oral Q3H  .  enoxaparin (LOVENOX) injection  40 mg Subcutaneous Q24H  . flutamide  125 mg Oral BID  . heparin  1,000 Units Intracatheter Once  . heparin  1,000 Units Intracatheter Once  . losartan  50 mg Oral Daily  . minoxidil  2.5 mg Oral Daily  . predniSONE  60 mg Oral Q breakfast  . pyridostigmine  60 mg Oral Q12H  . vitamin C  500 mg Oral Daily    Pertinent Labs/Diagnostics: No significant new labs  No results found.   Marilyn Quill PA-C [facility] [phone]  Impression: This 77 year old female with severe neck myasthenic gravis status post 3/5 treatments of PLEX.  At this point it is still unclear if this is myasthenia gravis that we are treating.  Have sent for blood tests to rule out Idamae Schuller variant of Guillain-Barr--although very less likely secondary to intact reflexes.  Labs for [employer] variant still pending  Today will be the last day of plasma exchange  Recommendations: After today's last day plasma exchange patient may be discharged home with removal of catheter.  Patient should follow-up with Dr. Posey Pronto at which point further intervention can be discussed and possible recommendation to go to tertiary center.  07/28/2017, 10:40 AM  Attending Neurohospitalist Addendum Patient seen and examined with APP/Resident. Agree with the history and physical as documented above. Agree with the plan as documented, which I helped formulate. I have independently reviewed the chart, obtained history, review of systems and examined the patient.I have personally reviewed pertinent head/neck/spine imaging (CT/MRI). Please feel free to call with any questions. --- Amie Portland, MD [facility]  Pager: [phone]  If 7pm to 7am, please call on call as listed on AMION.

## 2017-07-28 NOTE — Progress Notes (Signed)
PROGRESS NOTE    Marilyn [name]   OIZ:[id number]  DOB: 03/11/1941  DOA: 07/19/2017 PCP: Gayland Curry, DO   Brief Narrative:  Marilyn [name] is a 77 y.o. female with medical history significant for breast cancer 11 years ago status post lumpectomy and radiation, OCD, hypothyroidism, hypertension, GERD and irritable bowel.  She has a new diagnosis of myasthenia gravis and has been on Prednisone with IVIG being given in the end of January without improvement in neurological symptoms. She was referred as a direct admit to start Plasmapheresis as inpatient. Presenting complaints include drooping of her head, poor visual acuity, diplopia and upper extremity weakness.  Clinically improving.  Patient to complete fifth and last dose of plasmapheresis 2/20 and likely discharge home 2/21.  Neurology was consulted.    Subjective: Overall feels better.  Strength has improved and able to hold her neck up for sustained periods of time.  Assessment & Plan:   Principal Problem:   Myasthenia gravis with (acute) exacerbation  - HD temp cath was placed. - Neuro has consulted and ordered the plasmapheresis for 5 treatments every other day - cont Prednisone and Mestinon -Plasmapheresis is in progress without any complications.  Specifically, there is no bleeding. -As per neurology follow-up 2/20: At this point it is still unclear if this is myasthenia gravis.  Lab tests were sent off for [employer] P[employer] variant of GB although very less likely due to intact reflexes.  These labs are still pending.  As per neurology, patient may be discharged home after last plasmapheresis today and removal of HD catheter with outpatient follow-up with Dr. Posey Pronto for further recommendations.  Active Problems:     Hypertension - Controlled.  Continue ARB.  Hair loss - Minoxidil, Flutamide  DVT prophylaxis: Lovenox Code Status: Full code Family Communication: None at bedside. Disposition Plan: DC home  2/21.  Consultants:   Neurology  Procedures:   Temp dialysis cath and plasmapheresis  Antimicrobials:  Anti-infectives (From admission, onward)   None       Objective: Vitals:   07/28/17 1504 07/28/17 1509 07/28/17 1537 07/28/17 1540  BP: (!) 103/45 (!) 103/45 (!) 109/54 (!) 116/59  Pulse:      Resp: 15 14 19 17   Temp: 98.8 F (37.1 C) 98.8 F (37.1 C) 98.7 F (37.1 C) 98.4 F (36.9 C)  TempSrc: Oral Oral Oral Oral  SpO2:    98%  Weight:      Height:        Intake/Output Summary (Last 24 hours) at 07/28/2017 1716 Last data filed at 07/28/2017 0901 Gross per 24 hour  Intake 240 ml  Output -  Net 240 ml   Filed Weights   07/27/17 0500 07/28/17 0430 07/28/17 0838  Weight: 76.8 kg (169 lb 5 oz) 76.8 kg (169 lb 5 oz) 71.1 kg (156 lb 12 oz)    Examination: General exam: Pleasant elderly female, moderately built and nourished, ambulating comfortably in the room. Neck: Able to consistently hold up her neck which was week early on in admission. Respiratory system: Clear to auscultation. Respiratory effort normal.  Stable without change. Cardiovascular system: S1 & S2 heard, RRR.  No murmurs.  Stable without change. Gastrointestinal system: Abdomen soft, non-tender, nondistended. Normal bowel sound. No organomegaly.  Stable without change. Central nervous system: Alert and oriented. No focal neurological deficits. Extremities: No cyanosis, clubbing or edema  Data Reviewed: I have personally reviewed following labs and imaging studies  CBC: Recent Labs  Lab 07/22/17 1359  07/24/17 1737 07/26/17 1134 07/26/17 1335 07/28/17 0652 07/28/17 1356  WBC 9.6  --  9.9 14.1*  --  10.9*  --   HGB 12.2   < > 12.1 12.8 12.2 10.7* 12.9  HCT 38.7   < > 37.6 39.6 36.0 32.7* 38.0  MCV 97.0  --  96.7 95.7  --  96.7  --   PLT 222  --  192 191  --  154  --    < > = values in this interval not displayed.   Basic Metabolic Panel: Recent Labs  Lab 07/22/17 1359 07/22/17 1559   07/24/17 1737 07/26/17 1134 07/26/17 1335 07/28/17 1356 07/28/17 1440  NA 140  --    < > 140 140 141 140 139  K 3.8  --    < > 3.5 4.1 4.0 3.8 3.8  CL 107  --    < > 103 105 102 101 104  CO2 23  --   --  23 24  --   --  25  GLUCOSE 175*  --    < > 264* 148* 140* 148* 157*  BUN 10  --    < > 15 15 15 12 11   CREATININE 0.95  --    < > 0.93 0.88 0.80 0.80 0.90  CALCIUM 8.8* 8.4*  --  9.2 9.4  --   --  9.4   < > = values in this interval not displayed.   GFR: Estimated Creatinine Clearance: 51.7 mL/min (by C-G formula based on SCr of 0.9 mg/dL). Liver Function Tests: Recent Labs  Lab 07/22/17 1359 07/24/17 1737 07/26/17 1134 07/28/17 1440  AST 56* 51* 42* 31  ALT 38 38 30 22  ALKPHOS 37* 34* 36* 37*  BILITOT 0.5 0.7 0.8 0.7  PROT 5.9* 5.4* 5.9* 5.8*  ALBUMIN 3.7 3.7 4.4 4.1     Radiology Studies: No results found.    Scheduled Meds: . acidophilus  1 capsule Oral Daily  . enoxaparin (LOVENOX) injection  40 mg Subcutaneous Q24H  . flutamide  125 mg Oral BID  . heparin  1,000 Units Intracatheter Once  . heparin  1,000 Units Intracatheter Once  . losartan  50 mg Oral Daily  . minoxidil  2.5 mg Oral Daily  . predniSONE  60 mg Oral Q breakfast  . pyridostigmine  60 mg Oral Q12H  . vitamin C  500 mg Oral Daily   Continuous Infusions: . calcium gluconate IVPB    . citrate dextrose    . citrate dextrose    . citrate dextrose 500 mL (07/28/17 1431)     LOS: 9 days    Time spent in minutes: [place], MD, FACP, [facility]. [facility] Pager [phone]  If 7PM-7AM, please contact night-coverage [email or web address] Password [facility] 07/28/2017, 5:21 PM

## 2017-07-29 ENCOUNTER — Telehealth: Payer: Self-pay | Admitting: Neurology

## 2017-07-29 ENCOUNTER — Inpatient Hospital Stay (HOSPITAL_COMMUNITY): Payer: Medicare Other

## 2017-07-29 ENCOUNTER — Encounter (HOSPITAL_COMMUNITY): Payer: Self-pay | Admitting: General Surgery

## 2017-07-29 HISTORY — PX: IR PATIENT EVAL TECH 0-60 MINS: IMG5564

## 2017-07-29 MED ORDER — PYRIDOSTIGMINE BROMIDE 60 MG PO TABS: 60.0000 mg | ORAL_TABLET | ORAL | Status: DC

## 2017-07-29 MED ORDER — FLUTAMIDE 125 MG PO CAPS: ORAL_CAPSULE | ORAL | Status: DC

## 2017-07-29 NOTE — Procedures (Signed)
Removal of Temporary Dialysis catheter:  Removed catheter and held manual pressure for 5 minutes. No complications.  Instructed patient to remain upright for 2 hours.  France Ravens RT R, VI

## 2017-07-29 NOTE — Discharge Instructions (Signed)
Please get your medications reviewed and adjusted by your Primary MD. ° °Please request your Primary MD to go over all Hospital Tests and Procedure/Radiological results at the follow up, please get all Hospital records sent to your Prim MD by signing hospital release before you go home. ° °If you had Pneumonia of Lung problems at the Hospital: °Please get a 2 view Chest X ray done in 6-8 weeks after hospital discharge or sooner if instructed by your Primary MD. ° °If you have Congestive Heart Failure: °Please call your Cardiologist or Primary MD anytime you have any of the following symptoms:  °1) 3 pound weight gain in 24 hours or 5 pounds in 1 week  °2) shortness of breath, with or without a dry hacking cough  °3) swelling in the hands, feet or stomach  °4) if you have to sleep on extra pillows at night in order to breathe ° °Follow cardiac low salt diet and 1.5 lit/day fluid restriction. ° °If you have diabetes °Accuchecks 4 times/day, Once in AM empty stomach and then before each meal. °Log in all results and show them to your primary doctor at your next visit. °If any glucose reading is under 80 or above 300 call your primary MD immediately. ° °If you have Seizure/Convulsions/Epilepsy: °Please do not drive, operate heavy machinery, participate in activities at heights or participate in high speed sports until you have seen by Primary MD or a Neurologist and advised to do so again. ° °If you had Gastrointestinal Bleeding: °Please ask your Primary MD to check a complete blood count within one week of discharge or at your next visit. Your endoscopic/colonoscopic biopsies that are pending at the time of discharge, will also need to followed by your Primary MD. ° °Get Medicines reviewed and adjusted. °Please take all your medications with you for your next visit with your Primary MD ° °Please request your Primary MD to go over all hospital tests and procedure/radiological results at the follow up, please ask your  Primary MD to get all Hospital records sent to his/her office. ° °If you experience worsening of your admission symptoms, develop shortness of breath, life threatening emergency, suicidal or homicidal thoughts you must seek medical attention immediately by calling 911 or calling your MD immediately  if symptoms less severe. ° °You must read complete instructions/literature along with all the possible adverse reactions/side effects for all the Medicines you take and that have been prescribed to you. Take any new Medicines after you have completely understood and accpet all the possible adverse reactions/side effects.  ° °Do not drive or operate heavy machinery when taking Pain medications.  ° °Do not take more than prescribed Pain, Sleep and Anxiety Medications ° °Special Instructions: If you have smoked or chewed Tobacco  in the last 2 yrs please stop smoking, stop any regular Alcohol  and or any Recreational drug use. ° °Wear Seat belts while driving. ° °Please note °You were cared for by a hospitalist during your hospital stay. If you have any questions about your discharge medications or the care you received while you were in the hospital after you are discharged, you can call the unit and asked to speak with the hospitalist on call if the hospitalist that took care of you is not available. Once you are discharged, your primary care physician will handle any further medical issues. Please note that NO REFILLS for any discharge medications will be authorized once you are discharged, as it is imperative that you   return to your primary care physician (or establish a relationship with a primary care physician if you do not have one) for your aftercare needs so that they can reassess your need for medications and monitor your lab values.  You can reach the hospitalist office at phone [phone] or fax [phone]   If you do not have a primary care physician, you can call [phone] for a physician  referral.  Myasthenia Gravis Myasthenia gravis (MG) means severe weakness. It is a long-term (chronic) condition that causes weakness in the muscles you can control (voluntary muscles). MG can affect any voluntary muscle. The muscles most often affected are the ones that control:  Eye movement.  Facial movements.  Swallowing.  MG is an autoimmune disease, which means that your bodys defense system (immune system) attacks healthy parts of your body instead of germs and other things that make you sick. When you have MG, your immune system makes proteins (antibodies) that block the chemical (acetylcholine) your body needs to send nerve signals to your muscles. This causes muscle weakness. What are the causes? The exact cause of MG is unknown. One possible cause is an enlarged thymus gland, which is located under your breastbone. What are the signs or symptoms? The earliest symptom of MG is muscle weakness that gets worse with activity and gets better after rest. Other symptoms of MG may include:  Drooping eyelids.  Double vision.  Loss of facial expression.  Trouble chewing and swallowing.  Slurred speech.  A waddling walk.  Weakness of the arms, hands, and legs.  Trouble breathing is the most dangerous symptom of MG. Sudden and severe difficulty breathing (myasthenic crisis) may require emergency breathing support. This symptom sometimes happens after:  Infection.  Fever.  Drug reaction.  How is this diagnosed? It can be hard to diagnose MG because muscle weakness is a common symptom in many conditions. Your health care provider will do a physical exam. You may also have tests that will help make a diagnosis. These may include:  A blood test.  A test using the medicine edrophonium. This medicine increases muscle strength by slowing the breakdown of acetylcholine.  Tests to measure nerve conduction to muscle (electromyography).  An imaging study of the chest (CT or  MRI).  How is this treated? Treatment can improve muscle strength. Sometimes symptoms of MG go away for a while (remission) and you can stop treatment. Possible treatments include:  Medicine.  Removal of the thymus gland (thymectomy). This may result in a long remission for some people.  Follow these instructions at home:  Take medicines only as directed by your health care provider.  Get plenty of rest to conserve your energy.  Take frequent breaks to rest your eyes.  Maintain a healthy diet and a healthy weight.  Do not use any tobacco products including cigarettes, chewing tobacco, or electronic cigarettes. If you need help quitting, ask your health care provider.  Keep all follow-up visits as directed by your health care provider. This is important. Contact a health care provider if:  Your symptoms get worse after a fever or infection.  You have a reaction to a medicine you are taking.  Your symptoms change or get worse. Get help right away if: You have trouble breathing. This information is not intended to replace advice given to you by your health care provider. Make sure you discuss any questions you have with your health care provider. Document Released: 08/31/2000 Document Revised: 10/31/2015 Document Reviewed: 07/26/2013 Elsevier  Interactive Patient Education  [employer].

## 2017-07-29 NOTE — Progress Notes (Signed)
IR staff in and removed dialysis cath. No adverse reactions, no bleeding. Dressing dry and intact. Dr. Margreta Journey was notified

## 2017-07-29 NOTE — Telephone Encounter (Signed)
Please call patient for transition care management.

## 2017-07-29 NOTE — Discharge Summary (Signed)
Physician Discharge Summary  Marilyn [name] YBF:[id number] DOB: [birth year]  PCP: Gayland Curry, DO  Admit date: 07/19/2017 Discharge date: 07/29/2017  Recommendations for Outpatient Follow-up:  1. Dr. Narda Amber, Neurology on 08/05/17 at 11 AM. 2. Dr. Hollace Kinnier, PCP in 1 week with repeat labs (CBC & BMP).  Home Health: None Equipment/Devices: None  Discharge Condition: Improved and stable CODE STATUS: Full Diet recommendation: Heart healthy diet.  Discharge Diagnoses:  Principal Problem:   Myasthenia gravis with (acute) exacerbation ([place]) Active Problems:   Unspecified hypothyroidism   Hypertension   Brief Summary: Marilyn [name] is a 77 y.o.femalewith medical history significant forbreast cancer 11 years ago status post lumpectomy and radiation, OCD, hypothyroidism, hypertension, GERD and irritable bowel.  She has a new diagnosis of myasthenia gravis and has been on Prednisone with IVIG being given in the end of January without improvement in neurological symptoms. She was referred as a direct admit to start Plasmapheresis as inpatient. Presenting complaints included drooping of her head, poor visual acuity, diplopia and upper extremity weakness.  Neurology was consulted.  Assessment & Plan:   Principal Problem:   Myasthenia gravis with (acute) exacerbation  - HD temp cath was placed. - Neuro has consulted and ordered the plasmapheresis for 5 treatments every other day - cont Prednisone and Mestinon at prior home doses. -As per neurology follow-up 2/20: At this point it is still unclear if this is myasthenia gravis.  Lab tests were sent off for [employer] variant of GB although very less likely due to intact reflexes.  These labs are still pending and can be followed as outpatient.  As per neurology, patient may be discharged home after last plasmapheresis and removal of HD catheter with outpatient follow-up with Dr. Posey Pronto for further recommendations. -Completed last  cycle of plasmapheresis on 2/20.  HD catheter was removed this morning. -Clinically improved but still has some degree of neck weakness and has to hold it up at times.  Active Problems:     Hypertension - Controlled.  Continue ARB and minoxidil..    Consultants:   Neurology  Interventional radiology  Procedures:   Temp dialysis cath and plasmapheresis  HD catheter was removed on day of discharge.  Discharge Instructions  Discharge Instructions    Call MD for:   Complete by:  As directed    Worsening weakness, drooping of head and neck, double vision, difficulty walking or any symptoms suggestive of worsening of your myasthenia gravis.   Call MD for:  difficulty breathing, headache or visual disturbances   Complete by:  As directed    Call MD for:  extreme fatigue   Complete by:  As directed    Call MD for:  persistant dizziness or light-headedness   Complete by:  As directed    Diet - low sodium heart healthy   Complete by:  As directed    Increase activity slowly   Complete by:  As directed        Medication List    STOP taking these medications   methocarbamol 500 MG tablet Commonly known as:  ROBAXIN     TAKE these medications   acetaminophen 500 MG tablet Commonly known as:  TYLENOL Take 500 mg by mouth every 6 (six) hours as needed for mild pain.   aspirin 81 MG tablet Take 81 mg by mouth daily.   BENADRYL 25 MG tablet Generic drug:  diphenhydrAMINE Take 12.5-25 mg by mouth at bedtime as needed for sleep.  CALCIUM-VITAMIN D PO Take 1 capsule by mouth daily.   [employer] EASY FIBER/CALCIUM PO Take 1 tablet by mouth daily.   flutamide 125 MG capsule Commonly known as:  EULEXIN Take 125 mg by mouth two times a day What changed:    how much to take  how to take this  when to take this  additional instructions   HCA TRIPLE ANTIBIOTIC OINTMENT EX Apply 1 application topically daily as needed (irritation to nares).   hyoscyamine 0.125 MG  SL tablet Commonly known as:  LEVSIN SL DISSOLVE 1 TABLET UNDER TONGUE EVERY 4-6 HOURS AS NEEDED FOR DIARRHEA   losartan 50 MG tablet Commonly known as:  COZAAR TAKE 1 TABLET BY MOUTH EVERY DAY What changed:    how much to take  how to take this  when to take this   minoxidil 2.5 MG tablet Commonly known as:  LONITEN 1.66m by mouth daily   MULTIVITAMIN & MINERAL PO Take 1 tablet by mouth daily.   mupirocin ointment 2 % Commonly known as:  BACTROBAN APPLY TO AREA ON RIGHT LOWER LEG DAILY UNTIL HEALED, COVER WITH BANDAID   pantoprazole 40 MG tablet Commonly known as:  PROTONIX TAKE 1 TABLET (40 MG TOTAL) BY MOUTH EVERY OTHER DAY.   predniSONE 20 MG tablet Commonly known as:  DELTASONE Take 3 tablets (60 mg total) by mouth daily.   PROBIOTIC PO Take 1 tablet by mouth daily.   pyridostigmine 60 MG tablet Commonly known as:  MESTINON Take 1 tablet (60 mg total) by mouth See admin instructions. 664mby mouth twice daily - may take additional 6077mt 7pm if needed for energy What changed:    how much to take  how to take this  when to take this  additional instructions   traMADol 50 MG tablet Commonly known as:  ULTRAM Take 1 tablet (50 mg total) daily as needed by mouth (severe neck pain). What changed:  reasons to take this   vitamin C 500 MG tablet Commonly known as:  ASCORBIC ACID Take 500 mg by mouth daily.      Follow-up Information    PatAlda BertholdO Follow up on 08/05/2017.   Specialty:  Neurology Why:  11 AM. Contact information: [id number]     ReeHollace Kinnier DO. Schedule an appointment as soon as possible for a visit in 1 week(s).   Specialty:  Geriatric Medicine Why:  To be seen with repeat labs (CBC & BMP). Contact information: [address] [phone]       Allergies  Allergen Reactions  . Gabapentin Other (See Comments)    Balance  disturbance  . Cetacaine [Butamben-Tetracaine-Benzocaine] Other (See Comments)    LOST HER SENSE OF TASTE FOR OVER A MONTH!!!      Procedures/Studies: Ir Fluoro Guide Cv Line Right  Result Date: 07/19/2017 INDICATION: Myasthenia gravis. Please perform temporary dialysis catheter placement for the initiation of plasmapheresis. EXAM: NON-TUNNELED CENTRAL VENOUS HEMODIALYSIS CATHETER PLACEMENT WITH ULTRASOUND AND FLUOROSCOPIC GUIDANCE COMPARISON:  Chest CT - 06/02/2017 MEDICATIONS: None FLUOROSCOPY TIME:  54 seconds (9 mGy) COMPLICATIONS: None immediate. PROCEDURE: Informed written consent was obtained from the patient after a discussion of the risks, benefits, and alternatives to treatment. Questions regarding the procedure were encouraged and answered. The right neck and chest were prepped with chlorhexidine in a sterile fashion, and a sterile drape was applied covering the  operative field. Maximum barrier sterile technique with sterile gowns and gloves were used for the procedure. A timeout was performed prior to the initiation of the procedure. After the overlying soft tissues were anesthetized, a small venotomy incision was created and a micropuncture kit was utilized to access the internal jugular vein. Real-time ultrasound guidance was utilized for vascular access including the acquisition of a permanent ultrasound image documenting patency of the accessed vessel. The microwire was utilized to measure appropriate catheter length. A stiff glidewire was advanced to the level of the IVC. Under fluoroscopic guidance, the venotomy was serially dilated, ultimately allowing placement of a 20 cm temporary Mahurkar catheter with tip ultimately terminating within the superior aspect of the right atrium. Final catheter positioning was confirmed and documented with a spot radiographic image. The catheter aspirates and flushes normally. The catheter was flushed with appropriate volume heparin dwells. The catheter  exit site was secured with a 0-Prolene retention suture. A dressing was placed. The patient tolerated the procedure well without immediate post procedural complication. IMPRESSION: Successful placement of a right internal jugular approach 20 cm temporary dialysis catheter with tip terminating within the superior aspect of the right atrium. The catheter is ready for immediate use. PLAN: This catheter may be converted to a tunneled catheter at a later date as indicated. Electronically Signed   By: Sandi Mariscal M.D.   On: 07/19/2017 17:06   Ir US Guide Vasc Access Right  Result Date: 07/19/2017 INDICATION: Myasthenia gravis. Please perform temporary dialysis catheter placement for the initiation of plasmapheresis. EXAM: NON-TUNNELED CENTRAL VENOUS HEMODIALYSIS CATHETER PLACEMENT WITH ULTRASOUND AND FLUOROSCOPIC GUIDANCE COMPARISON:  Chest CT - 06/02/2017 MEDICATIONS: None FLUOROSCOPY TIME:  54 seconds (9 mGy) COMPLICATIONS: None immediate. PROCEDURE: Informed written consent was obtained from the patient after a discussion of the risks, benefits, and alternatives to treatment. Questions regarding the procedure were encouraged and answered. The right neck and chest were prepped with chlorhexidine in a sterile fashion, and a sterile drape was applied covering the operative field. Maximum barrier sterile technique with sterile gowns and gloves were used for the procedure. A timeout was performed prior to the initiation of the procedure. After the overlying soft tissues were anesthetized, a small venotomy incision was created and a micropuncture kit was utilized to access the internal jugular vein. Real-time ultrasound guidance was utilized for vascular access including the acquisition of a permanent ultrasound image documenting patency of the accessed vessel. The microwire was utilized to measure appropriate catheter length. A stiff glidewire was advanced to the level of the IVC. Under fluoroscopic guidance, the  venotomy was serially dilated, ultimately allowing placement of a 20 cm temporary Mahurkar catheter with tip ultimately terminating within the superior aspect of the right atrium. Final catheter positioning was confirmed and documented with a spot radiographic image. The catheter aspirates and flushes normally. The catheter was flushed with appropriate volume heparin dwells. The catheter exit site was secured with a 0-Prolene retention suture. A dressing was placed. The patient tolerated the procedure well without immediate post procedural complication. IMPRESSION: Successful placement of a right internal jugular approach 20 cm temporary dialysis catheter with tip terminating within the superior aspect of the right atrium. The catheter is ready for immediate use. PLAN: This catheter may be converted to a tunneled catheter at a later date as indicated. Electronically Signed   By: Sandi Mariscal M.D.   On: 07/19/2017 17:06   Ir Patient Eval Tech 0-60 Mins  Result Date: 07/29/2017 Karenann Cai  07/29/2017 10:52 AM Removal of Temporary Dialysis catheter: Removed catheter and held manual pressure for 5 minutes. No complications.  Instructed patient to remain upright for 2 hours. [place] Ravens RT R, VI     Subjective: Seen this morning.  Denied complaints.  Anxious to go home.  No double vision or dyspnea reported.  Discharge Exam:  Vitals:   07/28/17 1540 07/28/17 2153 07/29/17 0433 07/29/17 1000  BP: (!) 116/59 (!) 143/59 (!) 146/74 (!) 148/65  Pulse:  77 62 84  Resp: _0 Temp: 98.4 F (36.9 C) 98.3 F (36.8 C) 97.8 F (36.6 C) 98 F (36.7 C)  TempSrc: Oral Oral  Oral  SpO2: 98% 98% 100%   Weight:   70.4 kg (155 lb 1.9 oz)   Height:        General exam: Pleasant elderly female, moderately built and nourished, ambulating comfortably in the room.   Neck: At times noted to be holding up her chin/neck with her hand.  Also has a brace for same. Respiratory system: Clear to auscultation.  Respiratory effort normal.  Cardiovascular system: S1 & S2 heard, RRR. No JVD, murmurs or pedal edema. Gastrointestinal system: Abdomen soft, non-tender, nondistended. Normal bowel sound. No organomegaly.   Central nervous system: Alert and oriented. No focal neurological deficits. Extremities: No cyanosis, clubbing or edema      The results of significant diagnostics from this hospitalization (including imaging, microbiology, ancillary and laboratory) are listed below for reference.     Labs: CBC: Recent Labs  Lab 07/22/17 1359  07/24/17 1737 07/26/17 1134 07/26/17 1335 07/28/17 0652 07/28/17 1356  WBC 9.6  --  9.9 14.1*  --  10.9*  --   HGB 12.2   < > 12.1 12.8 12.2 10.7* 12.9  HCT 38.7   < > 37.6 39.6 36.0 32.7* 38.0  MCV 97.0  --  96.7 95.7  --  96.7  --   PLT 222  --  192 191  --  154  --    < > = values in this interval not displayed.   Basic Metabolic Panel: Recent Labs  Lab 07/22/17 1359 07/22/17 1559  07/24/17 1737 07/26/17 1134 07/26/17 1335 07/28/17 1356 07/28/17 1440  NA 140  --    < > 140 140 141 140 139  K 3.8  --    < > 3.5 4.1 4.0 3.8 3.8  CL 107  --    < > 103 105 102 101 104  CO2 23  --   --  23 24  --   --  25  GLUCOSE 175*  --    < > 264* 148* 140* 148* 157*  BUN 10  --    < > _1 CREATININE 0.95  --    < > 0.93 0.88 0.80 0.80 0.90  CALCIUM 8.8* 8.4*  --  9.2 9.4  --   --  9.4   < > = values in this interval not displayed.   Liver Function Tests: Recent Labs  Lab 07/22/17 1359 07/24/17 1737 07/26/17 1134 07/28/17 1440  AST 56* 51* 42* 31  ALT 38 38 30 22  ALKPHOS 37* 34* 36* 37*  BILITOT 0.5 0.7 0.8 0.7  PROT 5.9* 5.4* 5.9* 5.8*  ALBUMIN 3.7 3.7 4.4 4.1    Time coordinating discharge: Less than 30 minutes  SIGNED:  Vernell Leep, MD, FACP, [facility]. [facility] Pager [phone]  If 7PM-7AM, please contact night-coverage [email or web address]  Password [email or web address] 07/29/2017, 11:53 AM

## 2017-07-29 NOTE — Progress Notes (Signed)
Discharged home today. Instructions given by IR staff to sit up for next 2 hours and remove dressing after 24 hours. Other discharged instructions given. Patient verbalized understanding of instructions. Personal belongings given to patient. No further questions asked

## 2017-07-29 NOTE — Progress Notes (Signed)
Occupational Therapy Treatment Patient Details Name: Marilyn [name] MRN: [id number] DOB: [birth year] Today's Date: 07/29/2017    History of present illness 77 y.o. female admitted on 07/19/17 for progressive weakness associated with myesthenia gravis that was not responding to IVIG tx.  She is here acutely to receive plasmaphoresis treatments.  Pt with significant PMH of   OCD, cervical spine arthritis, HOH, migraine, low back pain, HTN, esophageal stricture, and breast CA R s/p lumpectomy.   OT comments  Pt progressing towards OT goals; demonstrates good carryover from previous sessions for donning HeadMaster collar, demonstrating this session with overall supervision and good fit of collar. Pt reports some continued difficulty with self-feeding with education provided on strategies for continued improvement/ease of task completion with pt verbalizing understanding. Pt reports feeling comfortable completing ADL tasks and HEP after return home; is anticipating d/c home later today.    Follow Up Recommendations  Outpatient OT    Equipment Recommendations  None recommended by OT          Precautions / Restrictions Precautions Precautions: None Required Braces or Orthoses: Other Brace/Splint Other Brace/Splint: HeadMaster Collar Restrictions Weight Bearing Restrictions: No       Mobility Bed Mobility               General bed mobility comments: OOB in chair on arrival  Transfers Overall transfer level: Independent Equipment used: None                  Balance Overall balance assessment: Mild deficits observed, not formally tested                                         ADL either performed or assessed with clinical judgement   ADL Overall ADL's : Needs assistance/impaired   Eating/Feeding Details (indicate cue type and reason): Pt reports continued difficulty performing self-feeding tasks; demonstrates bringing hand to mouth multiple times with  some decreased coordination noted; educated on use of deeper utensils when using spoon to decrease spillage with pt verbalizing understanding                                 Functional mobility during ADLs: Supervision/safety General ADL Comments: Pt dressed and preparing for discharge upon entering room; completing room level functional mobility with supervision this session; Pt did not currently have headmaster donned as Pt just recently had IJ catheter removed and reports was informed not to wear initially, using wash cloth under chin for added cervical support. Pt demonstrates proper donning of brace with good carryover of education provided in previous sessions regarding chin placement; Pt also with good recall of HEP reviewed in previous therapy sessions and able to verbalize exercises, reports feeling comfortable completing at home; pt with questions regarding driving after return home and advised Pt follow up with MD regarding driving recommendations                        Cognition Arousal/Alertness: Awake/alert Behavior During Therapy: [facility] for tasks assessed/performed Overall Cognitive Status: Within Functional Limits for tasks assessed  Pertinent Vitals/ Pain       Pain Assessment: No/denies pain                                                          Frequency  Min 3X/week        Progress Toward Goals  OT Goals(current goals can now be found in the care plan section)  Progress towards OT goals: Progressing toward goals  Acute Rehab OT Goals Patient Stated Goal: increase functional independence OT Goal Formulation: With patient Time For Goal Achievement: 08/03/17 Potential to Achieve Goals: Good  Plan Discharge plan remains appropriate                     AM-PAC PT "6 Clicks" Daily Activity     Outcome Measure   Help from  another person eating meals?: None Help from another person taking care of personal grooming?: None Help from another person toileting, which includes using toliet, bedpan, or urinal?: None Help from another person bathing (including washing, rinsing, drying)?: None Help from another person to put on and taking off regular upper body clothing?: None Help from another person to put on and taking off regular lower body clothing?: None 6 Click Score: 24    End of Session    OT Visit Diagnosis: Other abnormalities of gait and mobility (R26.89);Muscle weakness (generalized) (M62.81)   Activity Tolerance Patient tolerated treatment well   Patient Left with call bell/phone within reach;in chair   Nurse Communication Mobility status        Time: 7341-9379 OT Time Calculation (min): 18 min  Charges: OT General Charges $OT Visit: 1 Visit OT Treatments $Self Care/Home Management : 8-22 mins  Lou Cal, OT Pager [phone] 07/29/2017   Raymondo Band 07/29/2017, 2:49 PM

## 2017-07-30 ENCOUNTER — Telehealth: Payer: Self-pay | Admitting: *Deleted

## 2017-07-30 ENCOUNTER — Telehealth: Payer: Self-pay

## 2017-07-30 ENCOUNTER — Telehealth: Payer: Self-pay | Admitting: Neurology

## 2017-07-30 NOTE — Telephone Encounter (Signed)
Transition Care Management complete.

## 2017-07-30 NOTE — Telephone Encounter (Signed)
Transition Care Management Follow-up Telephone Call    Date discharged? 07-30-2017  How have you been since you were released from the hospital?  DO  Any patient concerns?  NO CONCERNS   Items Reviewed:  Medications reviewed:  YES  Allergies reviewed:  YES  Dietary changes reviewed:  NO CHANGES  Referrals reviewed:  YES   Functional Questionnaire:  Independent - I Dependent - D    Activities of Daily Living (ADLs):    Personal hygiene - I Dressing - I  Eating - I Maintaining continence - I Transferring - I  Independent Activities of Daily Living (iADLs): Basic communication skills - I Transportation - I Meal preparation  - I Shopping - I Housework - I Managing medications - I Managing personal finances - I   Confirmed importance and date/time of follow-up visits scheduled  YES  Provider Appointment booked with Narda Amber.  Confirmed with patient if condition begins to worsen call PCP or go to the ER.  Patient was given the office number and encouraged to call back with question or concerns: Yes

## 2017-07-30 NOTE — Telephone Encounter (Signed)
Patient's sister called and is she is unable to come with Kiannah to her appointment next week but would like to be on speaker phone during Kenesha's visit? Please Advise. Thanks

## 2017-07-30 NOTE — Telephone Encounter (Signed)
Transition Care Management Follow-Up Telephone Call   Date discharged and where: [facility] on 07/29/17  How have you been since you were released from the hospital? "Feeling great compared to 10 days in the hospital" Taking medications and has f/u appointment next Thursday with Neruology  Any patient concerns? None  Items Reviewed:   Meds: Y  Allergies: Y  Dietary Changes Reviewed: Y  Functional Questionnaire:  Independent-I Dependent-D  ADLs:   Dressing- I    Eating- I   Maintaining continence- I   Transferring- I   Transportation- D   Meal Prep- I   Managing Meds- I  Confirmed importance and Date/Time of follow-up visits scheduled: Yes, Sherrie Mustache, NP 08/10/2017 @ 2:15pm Will need labs ordered at visit   Confirmed with patient if condition worsens to call PCP or go to the Emergency Dept. Patient was given office number and encouraged to call back with questions or concerns: Yes

## 2017-08-02 NOTE — Telephone Encounter (Signed)
Yes, this would be fine.

## 2017-08-02 NOTE — Telephone Encounter (Signed)
Notified [name] that this would be ok per Dr. Posey Pronto.

## 2017-08-02 NOTE — Telephone Encounter (Signed)
Is this ok with you  ?

## 2017-08-05 ENCOUNTER — Encounter: Payer: Self-pay | Admitting: Neurology

## 2017-08-05 ENCOUNTER — Ambulatory Visit: Payer: Medicare Other | Admitting: Neurology

## 2017-08-05 VITALS — BP 170/70 | HR 83 | Ht 65.0 in | Wt 153.2 lb

## 2017-08-05 DIAGNOSIS — G7001 Myasthenia gravis with (acute) exacerbation: Secondary | ICD-10-CM

## 2017-08-05 DIAGNOSIS — R292 Abnormal reflex: Secondary | ICD-10-CM | POA: Diagnosis not present

## 2017-08-05 DIAGNOSIS — R29898 Other symptoms and signs involving the musculoskeletal system: Secondary | ICD-10-CM | POA: Diagnosis not present

## 2017-08-05 NOTE — Progress Notes (Signed)
Follow-up Visit   Date: 08/05/17    JAYONA MCCAIG MRN: [id number] DOB: 1941/03/23   Interim History: TIFFINEY SPARROW is a 77 y.o. right-handed Caucasian female with hypertension, history of right breast cancer s/p lumpectomy and radiation (2011) returning to the clinic for transition care management. The patient was accompanied to the clinic by self.   History of present illness: She was in [place], [place] on November 7th and accidentally spilled food on her blouse and felt that symptoms may have started around this time.  Over the next few weeks, she began having increased fatigue of her neck and soreness of upper arms.  She was visiting [place], [place] the week of Thanksgiving and noticed difficulty keeping her head upright, especially when driving.  She also describes weakness and greater effort when reaching overhead such as putting bird feeders on high hooks.  She has new intermittent hoarseness of the voice. There is no weakness of the legs.  She has history of strabismus and had two surgeries, there has been no worsening double vision. She sleeps on a recliner for 1.5 years due to GERD. She denies shortness of breath.  Symptoms tend to be worse at night and improved with rest.   She is very active, lives alone, and is an avid Information systems manager.  She went to the ER on 12/8 due to inability to hold her head upright and had MRI brain and cervical spine.  She has degenerative age-related changes of the brain and disc osteophyte complex at C3-4 through C6-7, but there is no cord compression.  She has been using a soft neck collar for support.    UPDATE 05/26/2017:  She is here for follow-up visit to discuss the results of EMG which was consistent with post-synaptic neuromuscular junction disorder.  Since she was last here, she feels that she can get tired easier and gets winded.  Otherwise, she continues to have severe head drop and arm weakness.  No difficulty swallowing.  There  is mild elevation in her CK, aldolase is normal.  Myasthenia antibodies were negative.   UPDATE 06/18/2017:  She is here for 3 week follow-up visit after starting prednisone 81m/d.  She has noticed mild improvement in voice, hand writing, vision, and stability with walking.  She has noticed less neck pain, but continues to have head drop.  When she is walking and sitting, she manually uses her arm to hold her head upright.  She drives with a towel folded under her chin, as she did not tolerate neck collar.   She has some shortness of breath with exertion, such as climbing stairs. She has significantly limited her activity because of this, such as household chores.   UPDATE 07/06/2017:  She is here for follow-up visit.  Because of continued head drop, she underwent induction dose of IVIG last weekand has been on prednisone 60 mg daily. Unfortunately, she has not noticed any new improvement with neck extension. Although, she notes that she is able to put bird features on high hooks and now able to see it, wears previously she was only using her arms to guide her.  She feels that her handwriting is poor and she is getting fatigued more often.  She has dull achy pain over the right arm, which is worse with internal rotation of the forearm.  She denies any tingling or electrical pain of the right arm. She has tried Tylenol which helps intermittently.  UPDATE 08/05/2017:  She is  here for follow-up visit after being admitted for plasmapheresis (2/11 - 07/29/2016), given no improvement of head drop with IVIG.  She completed 5 sessions of plasmapheresis.  She remains on prednisone 60 mg daily. Although patient did not appreciate significant change, her sister who was available on the phone didn't notice some improvement in her neck extension. She has been using a neck collar which has helped with neck soreness. Over the past week or so, she has noticed inability to taste food, which is new. She denies any difficulty  swallowing, dysarthria, shortness of breath, or double vision.    Medications:  Current Outpatient Medications on File Prior to Visit  Medication Sig Dispense Refill  . acetaminophen (TYLENOL) 500 MG tablet Take 500 mg by mouth every 6 (six) hours as needed for mild pain.     [name] aspirin 81 MG tablet Take 81 mg by mouth daily.    [name] CALCIUM-VITAMIN D PO Take 1 capsule by mouth daily.    . diphenhydrAMINE (BENADRYL) 25 MG tablet Take 12.5-25 mg by mouth at bedtime as needed for sleep.     . flutamide (EULEXIN) 125 MG capsule Take 125 mg by mouth two times a day    . hyoscyamine (LEVSIN SL) 0.125 MG SL tablet DISSOLVE 1 TABLET UNDER TONGUE EVERY 4-6 HOURS AS NEEDED FOR DIARRHEA 40 tablet 1  . losartan (COZAAR) 50 MG tablet TAKE 1 TABLET BY MOUTH EVERY DAY (Patient taking differently: Take 50 mg by mouth once a day) 90 tablet 1  . minoxidil (LONITEN) 2.5 MG tablet 1.56m by mouth daily  10  . Multiple Vitamins-Minerals (MULTIVITAMIN & MINERAL PO) Take 1 tablet by mouth daily.    . mupirocin ointment (BACTROBAN) 2 % APPLY TO AREA ON RIGHT LOWER LEG DAILY UNTIL HEALED, COVER WITH BANDAID  0  . Neomycin-Bacitracin-Polymyxin (HCA TRIPLE ANTIBIOTIC OINTMENT EX) Apply 1 application topically daily as needed (irritation to nares).    . pantoprazole (PROTONIX) 40 MG tablet TAKE 1 TABLET (40 MG TOTAL) BY MOUTH EVERY OTHER DAY. 30 tablet 3  . predniSONE (DELTASONE) 20 MG tablet Take 3 tablets (60 mg total) by mouth daily. 90 tablet 1  . Probiotic Product (PROBIOTIC PO) Take 1 tablet by mouth daily.    .Marland Kitchenpyridostigmine (MESTINON) 60 MG tablet Take 1 tablet (60 mg total) by mouth See admin instructions. 646mby mouth twice daily - may take additional 6033mt 7pm if needed for energy    . traMADol (ULTRAM) 50 MG tablet Take 1 tablet (50 mg total) daily as needed by mouth (severe neck pain). (Patient taking differently: Take 50 mg by mouth daily as needed (for severe neck pain). ) 30 tablet 0  . vitamin C  (ASCORBIC ACID) 500 MG tablet Take 500 mg by mouth daily.    . WKerry Doryxtrin-Calcium ([employer] EASY FIBER/CALCIUM PO) Take 1 tablet by mouth daily.      No current facility-administered medications on file prior to visit.     Allergies:  Allergies  Allergen Reactions  . Gabapentin Other (See Comments)    Balance disturbance  . Cetacaine [Butamben-Tetracaine-Benzocaine] Other (See Comments)    LOST HER SENSE OF TASTE FOR OVER A MONTH!!!    Review of Systems:  CONSTITUTIONAL: No fevers, chills, night sweats, or weight loss.  EYES: +visual changes or eye pain ENT: No hearing changes.  No history of nose bleeds.   RESPIRATORY: No cough, wheezing and shortness of breath.   CARDIOVASCULAR: Negative for chest pain, and palpitations.  GI: Negative for abdominal discomfort, blood in stools or black stools.  No recent change in bowel habits.   GU:  No history of incontinence.   MUSCLOSKELETAL: No history of joint pain or swelling.  No myalgias.   SKIN: Negative for lesions, rash, and itching.   ENDOCRINE: Negative for cold or heat intolerance, polydipsia or goiter.   PSYCH:  No depression or anxiety symptoms.   NEURO: As Above.   Vital Signs:  BP (!) 170/70   Pulse 83   Ht _0  (1.651 m)   Wt 153 lb 4 oz (69.5 kg)   SpO2 97%   BMI 25.50 kg/m    General: Moderate head drop, she is able to hold head upright without using neck collar, which is improved. She appears comfortable.  Eyes/ENT: see cranial nerve examination.   Neck: No masses appreciated.  Full range of motion without tenderness.  No carotid bruits. Respiratory:  Clear to auscultation, good air entry bilaterally.   Cardiac:  Regular rate and rhythm, no murmur.     Neurological Exam: MENTAL STATUS including orientation to time, place, person, recent and remote memory, attention span and concentration, language, and fund of knowledge is normal.  Speech is not dysarthric and does not have hoarseness.  CRANIAL NERVES:   Pupils  equal round and reactive to light.  Normal conjugate, extra-ocular eye movements in all directions of gaze.  No ptosis.  Face is symmetric. Orbicularis oculi and oris is 5/5.  There is subtle weakness of the buccinator muscles.  Palate elevates symmetrically.  Tongue is midline, strength is 5/5.  MOTOR:  There is some mild cervical paraspinal muscle atrophy. No fasciculations. No pronator drift.  Tone is normal.  There is no muscle fatigability of proximal limb muscle groups.  Neck flexion is 5/5  Next extension is 3+/5 *improved  Right Upper Extremity:    Left Upper Extremity:    Deltoid  5/5   Deltoid  5/5   Biceps  5/5   Biceps  5/5   Triceps  5/5   Triceps  5/5   Wrist extensors  5/5   Wrist extensors  5/5   Wrist flexors  5/5   Wrist flexors  5/5   Finger extensors  5/5   Finger extensors  5/5   Finger flexors  5/5   Finger flexors  5/5   Dorsal interossei  5/5   Dorsal interossei  5/5   Abductor pollicis  5/5   Abductor pollicis  5/5   Tone (Ashworth scale)  0  Tone (Ashworth scale)  0   Right Lower Extremity:    Left Lower Extremity:    Hip flexors  5/5   Hip flexors  5/5   Hip extensors  5/5   Hip extensors  5/5   Knee flexors  5/5   Knee flexors  5/5   Knee extensors  5/5   Knee extensors  5/5   Dorsiflexors  5/5   Dorsiflexors  5/5   Plantarflexors  5/5   Plantarflexors  5/5   Toe extensors  5/5   Toe extensors  5/5   Toe flexors  5/5   Toe flexors  5/5   Tone (Ashworth scale)  0  Tone (Ashworth scale)  0   REFLEXES:  Reflexes are brisk and 3+ throughout.  COORDINATION/GAIT:  She is able to walk with head upright but there is compensated lordosis of the thoracic region.  She is able to stand without using arms to push off  chair.   Data: NCS/EMG of the right side 05/26/2017: The electrophysiologic findings are most consistent with a postsynaptic neuromuscular junction disorder.  Lab Results  Component Value Date   CKTOTAL 120 07/06/2017   Lab Results  Component Value  Date   TSH 0.556 07/19/2017  Myasthenia gravis antibodies, MuSK panel - negative Labs 07/06/2017:  CK 120, aldolase 5.2, SPEP with IFE no M protein, ACE 38, HbA1c, ESR 36, CRP 0.1  CT chest w contrast 06/02/2017: 1. Negative for mediastinal mass lesion. 2. Mild biapical pleural and parenchymal scarring. 3. Enlarged extrahepatic common bile duct, suggest correlation with appropriate laboratory values.  MRI cervical spine wo contrast 05/15/2017: 1. No acute osseous abnormality or abnormal cord signal. 2. Reversal of cervical curvature and moderate cervical spine spondylosis. 3. Prominent disc osteophyte complexes at the C3-4 through C6-7 levels efface the ventral thecal sac with anterior cord contact and mild anterior cord flattening. No significant canal stenosis. No cord compression. 4. Multilevel mild foraminal stenosis. Moderate right C3-4 foraminal stenosis   IMPRESSION/PLAN: Dropped head syndrome, very mildly improved after plasmapheresis. She was initially treated for myasthenia gravis exacerbation with high-dose steroids and IVIG without any improvement in head drop, however did improve proximal arm weakness and dysarthria. She was subsequently hospitalized for plasmapheresis and has mild improvement, whereas she is able to hold her head upright. Given her poor response to immunomodulatory therapy, I question whether her head drop is truly due to neuromuscular weakness and whether her underlying cervical spondylosis is playing a role, especially given her brisk reflexes.   Other possibilities have included neck extensor myopathy, however there is no evidence of myopathic motor units on her EMG and her CK has been normal, except for very slight initial elevation of 128. I do have to keep in mind that she has been on high-dose prednisone when some of these labs were rechecked, and therefore could be misleading. Labs looking for infiltrative condition such as sarcoid and amyloidosis returned  normal.  We discussed that metabolic myopathies can also manifest as such, but again I would expect elevated muscle enzymes.  Doubt ALS given absence of findings on EMG and lack of clinical findings.    At this juncture, I would recommend that we start to taper her prednisone by 10 mg per week as this may be contributing to her vision changes and lack of taste and I do not believe her current condition is a manifestation of myasthenia, given lack of significant improvement with IVIG and plasmapheresis.  Repeat MRI cervical spine with contrast to assess for neck extensor myopathy or structural impingement.  MRI from December 2018 was personally reviewed which shows multilevel osteophyte complex from C3-C7 with mild flattening of the anterior cord.  Recommend starting outpatient neck physiotherapy.  I will seek the guidance of neuromuscular specialist at [facility] for their opinion.  Greater than 50% of this 45 minute visit was spent in counseling, explanation of diagnosis, planning of further management, and coordination of care.   Thank you for allowing me to participate in patient's care.  If I can answer any additional questions, I would be pleased to do so.    Sincerely,    Nalini Alcaraz K. Posey Pronto, DO

## 2017-08-05 NOTE — Patient Instructions (Addendum)
OK to reduce prednisone to 50mg  and taper by 10mg  each week, until you are on 10mg  daily  Start neck physiotherapy  MRI cervical spine wwo contrast  We will make a referral to neuromuscular specialist at [facility]  I will be in contact with you regarding your results and recommendations

## 2017-08-10 ENCOUNTER — Ambulatory Visit (INDEPENDENT_AMBULATORY_CARE_PROVIDER_SITE_OTHER): Payer: Medicare Other | Admitting: Nurse Practitioner

## 2017-08-10 ENCOUNTER — Encounter: Payer: Self-pay | Admitting: Nurse Practitioner

## 2017-08-10 VITALS — BP 174/86 | HR 87 | Temp 98.7°F | Ht 65.0 in | Wt 154.0 lb

## 2017-08-10 DIAGNOSIS — I10 Essential (primary) hypertension: Secondary | ICD-10-CM

## 2017-08-10 DIAGNOSIS — R29898 Other symptoms and signs involving the musculoskeletal system: Secondary | ICD-10-CM | POA: Diagnosis not present

## 2017-08-10 LAB — MISC LABCORP TEST (SEND OUT): LABCORP TEST CODE: 9985

## 2017-08-10 MED ORDER — LOSARTAN POTASSIUM 100 MG PO TABS: 100.0000 mg | ORAL_TABLET | Freq: Every day | ORAL | 1 refills | Status: DC

## 2017-08-10 NOTE — Progress Notes (Signed)
Careteam: Patient Care Team: Gayland Curry, DO as PCP - General (Geriatric Medicine) Loney Loh, MD (Dermatology) Lafayette Dragon, MD (Inactive) as Consulting Physician (Gastroenterology) Magrinat, Virgie Dad, MD as Consulting Physician (Oncology) Shon Hough, MD as Consulting Physician (Ophthalmology) Leta Baptist, MD as Consulting Physician (Otolaryngology) Rolm Bookbinder, MD as Consulting Physician (General Surgery) Irene Shipper, MD as Consulting Physician (Gastroenterology) Blima Rich, MD as Referring Physician (Obstetrics and Gynecology)  Advanced Directive information    Allergies  Allergen Reactions  . Gabapentin Other (See Comments)    Balance disturbance  . Cetacaine [Butamben-Tetracaine-Benzocaine] Other (See Comments)    LOST HER SENSE OF TASTE FOR OVER A MONTH!!!    Chief Complaint  Patient presents with  . Hospitalization Follow-up    Pt is being seen to follow up on recent hospital stay at [facility] 07/19/17 to 07/29/17 for myasthenia gravis. Pt reports only slight improvement since being in hospital.      HPI: Patient is a 77 y.o. female seen in the office today for hospital follow up.  Pt completed TOC visit with neurologist Dr Posey Pronto on 08/05/17. She has a medical history significant forbreast cancer 11 years ago status post lumpectomy and radiation, OCD, hypothyroidism, hypertension, GERD and irritable bowel. She has a new diagnosis of myasthenia gravis and had been on Prednisone with IVIG being given in the end of January without improvement in neurological symptoms. Shewas referred as a direct admit to start Plasmapheresis as inpatient and followed by neurology. She was having drooping of her head, poor visual acuity, diplopia and upper extremity weakness. Neurology has been following.  She has completed 5 sessions of plasmapheresis and remains on prednisone 60 mg daily. Using neck collar for support. Overall not a significant  improvement. Neurology questioned myasthenia gravis as cause and titration of prednisone was started. She was also recommended to start outpt neck physiotherapy and referral placed for neuromuscular specialist at [place] [facility] and repeat MRI neck.  Pt reports some improvement in weakness in her neck with weaning off prednisone Now able to taste which she had loss and also able to manipulate her tongue which she had lost.   Review of Systems:  Review of Systems  Constitutional: Negative for chills, fever and malaise/fatigue.  Eyes: Negative.        No acute changes, some chronic issues noted with reading small print  Respiratory: Negative for cough and shortness of breath.   Cardiovascular: Negative for chest pain and palpitations.  Musculoskeletal: Negative for myalgias and neck pain.  Neurological: Positive for weakness (neck and head). Negative for dizziness, tingling and headaches.    Past Medical History:  Diagnosis Date  . Abnormality of gait   . Alopecia, unspecified   . Altered mental status   . Breast cancer ([place])    stage 1; right  . Cataracts, bilateral   . Decreased rectal sphincter tone 07/01/12  . Diverticulosis   . Enterocele   . Esophageal stricture 07/01/12  . Hernia   . Hiatal hernia   . Hypertension   . Irritable bowel syndrome   . Lumbago   . Migraine with aura, without mention of intractable migraine without mention of status migrainosus   . Mixed hearing loss, bilateral   . Myasthenia gravis ([place])   . 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 Osteoarthrosis, unspecified whether generalized or localized, unspecified site   . Other and unspecified hyperlipidemia   . Plantar fascial fibromatosis   . Rectal prolapse   .  Reflux esophagitis   . Regional enteritis of small intestine ([place])   . Senile osteoporosis   . Spasm of muscle   . Unspecified constipation   . Unspecified hypothyroidism    Past Surgical History:  Procedure  Laterality Date  . APPENDECTOMY    . BASAL CELL CARCINOMA EXCISION  1978   neck  . BREAST LUMPECTOMY Right    snbx, apbi  . Cataract removal OD  06/28/2012   . Cataract removal OS  12/13/2012  . dermatolfibroma  2012  . DILATION AND CURETTAGE OF UTERUS    . ENTEROCELE REPAIR    . HAIR TRANSPLANT  2008  . HERNIA REPAIR    . IR FLUORO GUIDE CV LINE RIGHT  07/19/2017  . IR PATIENT EVAL TECH 0-60 MINS  07/29/2017  . IR US GUIDE VASC ACCESS RIGHT  07/19/2017  . OOPHORECTOMY    . strabisumus eye surgery     x 2  . TONSILLECTOMY     Social History:   reports that  has never smoked. she has never used smokeless tobacco. She reports that she does not drink alcohol or use drugs.  Family History  Problem Relation Age of Onset  . Hypertension Mother   . Stroke Mother   . Alzheimer's disease Father   . Hypertension Brother   . Breast cancer Maternal Aunt   . Gallbladder disease Brother   . Colon cancer Neg Hx     Medications: Patient's Medications  New Prescriptions   No medications on file  Previous Medications   ACETAMINOPHEN (TYLENOL) 500 MG TABLET    Take 500 mg by mouth every 6 (six) hours as needed for mild pain.    ASPIRIN 81 MG TABLET    Take 81 mg by mouth daily.   CALCIUM-VITAMIN D PO    Take 1 capsule by mouth daily.   DIPHENHYDRAMINE (BENADRYL) 25 MG TABLET    Take 12.5-25 mg by mouth at bedtime as needed for sleep.    FLUTAMIDE (EULEXIN) 125 MG CAPSULE    Take 125 mg by mouth two times a day   HYOSCYAMINE (LEVSIN SL) 0.125 MG SL TABLET    DISSOLVE 1 TABLET UNDER TONGUE EVERY 4-6 HOURS AS NEEDED FOR DIARRHEA   LOSARTAN (COZAAR) 50 MG TABLET    TAKE 1 TABLET BY MOUTH EVERY DAY   MINOXIDIL (LONITEN) 2.5 MG TABLET    1.25mg  by mouth daily   MULTIPLE VITAMINS-MINERALS (MULTIVITAMIN & MINERAL PO)    Take 1 tablet by mouth daily.   MUPIROCIN OINTMENT (BACTROBAN) 2 %    APPLY TO AREA ON RIGHT LOWER LEG DAILY UNTIL HEALED, COVER WITH BANDAID   NEOMYCIN-BACITRACIN-POLYMYXIN (HCA  TRIPLE ANTIBIOTIC OINTMENT EX)    Apply 1 application topically daily as needed (irritation to nares).   PANTOPRAZOLE (PROTONIX) 40 MG TABLET    TAKE 1 TABLET (40 MG TOTAL) BY MOUTH EVERY OTHER DAY.   PREDNISONE (DELTASONE) 20 MG TABLET    Take 3 tablets (60 mg total) by mouth daily.   PROBIOTIC PRODUCT (PROBIOTIC PO)    Take 1 tablet by mouth daily.   PYRIDOSTIGMINE (MESTINON) 60 MG TABLET    Take 1 tablet (60 mg total) by mouth See admin instructions. 60mg  by mouth twice daily - may take additional 60mg  at 7pm if needed for energy   TRAMADOL (ULTRAM) 50 MG TABLET    Take 1 tablet (50 mg total) daily as needed by mouth (severe neck pain).   VITAMIN C (ASCORBIC ACID) 500 MG  TABLET    Take 500 mg by mouth daily.   WHEAT DEXTRIN-CALCIUM ([employer]S EASY FIBER/CALCIUM PO)    Take 1 tablet by mouth daily.   Modified Medications   No medications on file  Discontinued Medications   No medications on file     Physical Exam:  Vitals:   08/10/17 1422  BP: (!) 174/86  Pulse: 87  Temp: 98.7 F (37.1 C)  SpO2: 99%  Weight: 154 lb (69.9 kg)  Height: 5\' 5"  (1.651 m)   Body mass index is 25.63 kg/m.  Physical Exam  Constitutional: She is oriented to person, place, and time. She appears well-developed and well-nourished. No distress.  HENT:  Head: Normocephalic and atraumatic.  Mouth/Throat: Oropharynx is clear and moist. No oropharyngeal exudate.  Eyes: Conjunctivae are normal. Pupils are equal, round, and reactive to light.  Neck: Normal range of motion. Neck supple.  Cardiovascular: Normal rate, regular rhythm and normal heart sounds.  Pulmonary/Chest: Effort normal and breath sounds normal.  Musculoskeletal: She exhibits no edema or tenderness.  Neurological: She is alert and oriented to person, place, and time.  Skin: Skin is warm and dry. She is not diaphoretic.  Psychiatric: She has a normal mood and affect.    Labs reviewed: Basic Metabolic Panel: Recent Labs    05/21/17 1522   07/19/17 1150 07/19/17 1152  07/24/17 1737 07/26/17 1134 07/26/17 1335 07/28/17 1356 07/28/17 1440  NA  --    < > 135  --    < > 140 140 141 140 139  K  --    < > 3.7  --    < > 3.5 4.1 4.0 3.8 3.8  CL  --    < > 100*  --    < > 103 105 102 101 104  CO2  --    < > 21*  --    < > 23 24  --   --  25  GLUCOSE  --    < > 98  --    < > 264* 148* 140* 148* 157*  BUN  --    < > 19  --    < > 15 15 15 12 11   CREATININE  --    < > 0.92  --    < > 0.93 0.88 0.80 0.80 0.90  CALCIUM  --    < > 9.2  --    < > 9.2 9.4  --   --  9.4  MG  --   --  2.1  --   --   --   --   --   --   --   PHOS  --   --  3.2  --   --   --   --   --   --   --   TSH 2.57  --   --  0.556  --   --   --   --   --   --    < > = values in this interval not displayed.   Liver Function Tests: Recent Labs    07/24/17 1737 07/26/17 1134 07/28/17 1440  AST 51* 42* 31  ALT 38 30 22  ALKPHOS 34* 36* 37*  BILITOT 0.7 0.8 0.7  PROT 5.4* 5.9* 5.8*  ALBUMIN 3.7 4.4 4.1   No results for input(s): LIPASE, AMYLASE in the last 8760 hours. No results for input(s): AMMONIA in the last 8760 hours. CBC: Recent Labs  05/15/17 1917 07/19/17 1150  07/24/17 1737 07/26/17 1134 07/26/17 1335 07/28/17 0652 07/28/17 1356  WBC 7.9 9.7   < > 9.9 14.1*  --  10.9*  --   NEUTROABS 5.5 8.7*  --   --   --   --   --   --   HGB 13.2 13.0   < > 12.1 12.8 12.2 10.7* 12.9  HCT 39.7 40.6   < > 37.6 39.6 36.0 32.7* 38.0  MCV 93.6 95.8   < > 96.7 95.7  --  96.7  --   PLT 382 311   < > 192 191  --  154  --    < > = values in this interval not displayed.   Lipid Panel: Recent Labs    09/28/16 1000  CHOL 195  HDL 53  LDLCALC 125*  TRIG 85  CHOLHDL 3.7   TSH: Recent Labs    05/21/17 1522 07/19/17 1152  TSH 2.57 0.556   A1C: Lab Results  Component Value Date   HGBA1C 5.7 07/06/2017     Assessment/Plan 1. Dropped head syndrome Ongoing follow up with neurology and now planning to see a specialist at [facility] for further  evaluation due to limited improvement with plasmapheresis. Pt reports small amouts of improvement now that prednisone taper.  2. Essential hypertension -has been elevated, will increase losartan to 100 mg daily, also discussed dash diet which she has already been doing. Information given.  - CBC with Differential/Platelets - BASIC METABOLIC PANEL WITH GFR - losartan (COZAAR) 100 MG tablet; Take 1 tablet (100 mg total) by mouth daily.  Dispense: 30 tablet; Refill: 1  Next appt: 2 weeks for blood pressure check Shacara Cozine K. Harle Battiest  [facility] [phone] 8 am - 5 pm) [phone] (after hours)

## 2017-08-10 NOTE — Patient Instructions (Addendum)
Increase losartan to 100 mg by mouth daily due to elevated blood pressure  Follow up in 2 weeks on blood pressure  DASH Eating Plan DASH stands for "Dietary Approaches to Stop Hypertension." The DASH eating plan is a healthy eating plan that has been shown to reduce high blood pressure (hypertension). It may also reduce your risk for type 2 diabetes, heart disease, and stroke. The DASH eating plan may also help with weight loss. What are tips for following this plan? General guidelines  Avoid eating more than 2,300 mg (milligrams) of salt (sodium) a day. If you have hypertension, you may need to reduce your sodium intake to 1,500 mg a day.  Limit alcohol intake to no more than 1 drink a day for nonpregnant women and 2 drinks a day for men. One drink equals 12 oz of beer, 5 oz of wine, or 1 oz of hard liquor.  Work with your health care provider to maintain a healthy body weight or to lose weight. Ask what an ideal weight is for you.  Get at least 30 minutes of exercise that causes your heart to beat faster (aerobic exercise) most days of the week. Activities may include walking, swimming, or biking.  Work with your health care provider or diet and nutrition specialist (dietitian) to adjust your eating plan to your individual calorie needs. Reading food labels  Check food labels for the amount of sodium per serving. Choose foods with less than 5 percent of the Daily Value of sodium. Generally, foods with less than 300 mg of sodium per serving fit into this eating plan.  To find whole grains, look for the word "whole" as the first word in the ingredient list. Shopping  Buy products labeled as "low-sodium" or "no salt added."  Buy fresh foods. Avoid canned foods and premade or frozen meals. Cooking  Avoid adding salt when cooking. Use salt-free seasonings or herbs instead of table salt or sea salt. Check with your health care provider or pharmacist before using salt substitutes.  Do not  fry foods. Cook foods using healthy methods such as baking, boiling, grilling, and broiling instead.  Cook with heart-healthy oils, such as olive, canola, soybean, or sunflower oil. Meal planning   Eat a balanced diet that includes: ? 5 or more servings of fruits and vegetables each day. At each meal, try to fill half of your plate with fruits and vegetables. ? Up to 6-8 servings of whole grains each day. ? Less than 6 oz of lean meat, poultry, or fish each day. A 3-oz serving of meat is about the same size as a deck of cards. One egg equals 1 oz. ? 2 servings of low-fat dairy each day. ? A serving of nuts, seeds, or beans 5 times each week. ? Heart-healthy fats. Healthy fats called Omega-3 fatty acids are found in foods such as flaxseeds and coldwater fish, like sardines, salmon, and mackerel.  Limit how much you eat of the following: ? Canned or prepackaged foods. ? Food that is high in trans fat, such as fried foods. ? Food that is high in saturated fat, such as fatty meat. ? Sweets, desserts, sugary drinks, and other foods with added sugar. ? Full-fat dairy products.  Do not salt foods before eating.  Try to eat at least 2 vegetarian meals each week.  Eat more home-cooked food and less restaurant, buffet, and fast food.  When eating at a restaurant, ask that your food be prepared with less salt or  no salt, if possible. What foods are recommended? The items listed may not be a complete list. Talk with your dietitian about what dietary choices are best for you. Grains Whole-grain or whole-wheat bread. Whole-grain or whole-wheat pasta. Brown rice. [place]. Bulgur. Whole-grain and low-sodium cereals. Pita bread. Low-fat, low-sodium crackers. Whole-wheat flour tortillas. Vegetables Fresh or frozen vegetables (raw, steamed, roasted, or grilled). Low-sodium or reduced-sodium tomato and vegetable juice. Low-sodium or reduced-sodium tomato sauce and tomato paste. Low-sodium or  reduced-sodium canned vegetables. Fruits All fresh, dried, or frozen fruit. Canned fruit in natural juice (without added sugar). Meat and other protein foods Skinless chicken or [place]. Ground chicken or [place]. Pork with fat trimmed off. Fish and seafood. Egg whites. Dried beans, peas, or lentils. Unsalted nuts, nut butters, and seeds. Unsalted canned beans. Lean cuts of beef with fat trimmed off. Low-sodium, lean deli meat. Dairy Low-fat (1%) or fat-free (skim) milk. Fat-free, low-fat, or reduced-fat cheeses. Nonfat, low-sodium ricotta or cottage cheese. Low-fat or nonfat yogurt. Low-fat, low-sodium cheese. Fats and oils Soft margarine without trans fats. Vegetable oil. Low-fat, reduced-fat, or light mayonnaise and salad dressings (reduced-sodium). Canola, safflower, olive, soybean, and sunflower oils. Avocado. Seasoning and other foods Herbs. Spices. Seasoning mixes without salt. Unsalted popcorn and pretzels. Fat-free sweets. What foods are not recommended? The items listed may not be a complete list. Talk with your dietitian about what dietary choices are best for you. Grains Baked goods made with fat, such as croissants, muffins, or some breads. Dry pasta or rice meal packs. Vegetables Creamed or fried vegetables. Vegetables in a cheese sauce. Regular canned vegetables (not low-sodium or reduced-sodium). Regular canned tomato sauce and paste (not low-sodium or reduced-sodium). Regular tomato and vegetable juice (not low-sodium or reduced-sodium). Angie Fava. Olives. Fruits Canned fruit in a light or heavy syrup. Fried fruit. Fruit in cream or butter sauce. Meat and other protein foods Fatty cuts of meat. Ribs. Fried meat. Berniece Salines. Sausage. Bologna and other processed lunch meats. Salami. Fatback. Hotdogs. Bratwurst. Salted nuts and seeds. Canned beans with added salt. Canned or smoked fish. Whole eggs or egg yolks. Chicken or [place] with skin. Dairy Whole or 2% milk, cream, and half-and-half.  Whole or full-fat cream cheese. Whole-fat or sweetened yogurt. Full-fat cheese. Nondairy creamers. Whipped toppings. Processed cheese and cheese spreads. Fats and oils Butter. Stick margarine. Lard. Shortening. Ghee. Bacon fat. Tropical oils, such as coconut, palm kernel, or palm oil. Seasoning and other foods Salted popcorn and pretzels. Onion salt, garlic salt, seasoned salt, table salt, and sea salt. Worcestershire sauce. Tartar sauce. Barbecue sauce. Teriyaki sauce. Soy sauce, including reduced-sodium. Steak sauce. Canned and packaged gravies. Fish sauce. Oyster sauce. Cocktail sauce. Horseradish that you find on the shelf. Ketchup. Mustard. Meat flavorings and tenderizers. Bouillon cubes. Hot sauce and Tabasco sauce. Premade or packaged marinades. Premade or packaged taco seasonings. Relishes. Regular salad dressings. Where to find more information:  National Heart, Lung, and [place]: https://wilson-eaton.com/  American Heart Association: www.heart.org Summary  The DASH eating plan is a healthy eating plan that has been shown to reduce high blood pressure (hypertension). It may also reduce your risk for type 2 diabetes, heart disease, and stroke.  With the DASH eating plan, you should limit salt (sodium) intake to 2,300 mg a day. If you have hypertension, you may need to reduce your sodium intake to 1,500 mg a day.  When on the DASH eating plan, aim to eat more fresh fruits and vegetables, whole grains, lean proteins, low-fat dairy, and  heart-healthy fats.  Work with your health care provider or diet and nutrition specialist (dietitian) to adjust your eating plan to your individual calorie needs. This information is not intended to replace advice given to you by your health care provider. Make sure you discuss any questions you have with your health care provider. Document Released: 05/14/2011 Document Revised: 05/18/2016 Document Reviewed: 05/18/2016 Elsevier Interactive Patient Education   [employer].

## 2017-08-11 LAB — BASIC METABOLIC PANEL WITH GFR
BUN/Creatinine Ratio: 25 (calc) — ABNORMAL HIGH (ref 6–22)
BUN: 24 mg/dL (ref 7–25)
CALCIUM: 9.7 mg/dL (ref 8.6–10.4)
CO2: 25 mmol/L (ref 20–32)
Chloride: 103 mmol/L (ref 98–110)
Creat: 0.95 mg/dL — ABNORMAL HIGH (ref 0.60–0.93)
GFR, EST NON AFRICAN AMERICAN: 58 mL/min/{1.73_m2} — AB (ref >=60)
GFR, Est African American: 67 mL/min/{1.73_m2} (ref >=60)
Glucose, Bld: 125 mg/dL (ref 65–139)
POTASSIUM: 4.3 mmol/L (ref 3.5–5.3)
Sodium: 139 mmol/L (ref 135–146)

## 2017-08-11 LAB — CBC WITH DIFFERENTIAL/PLATELET
BASOS ABS: 29 {cells}/uL (ref 0–200)
BASOS PCT: 0.2 %
EOS ABS: 0 {cells}/uL — AB (ref 15–500)
Eosinophils Relative: 0 %
HCT: 36.3 % (ref 35.0–45.0)
HEMOGLOBIN: 12.2 g/dL (ref 11.7–15.5)
Lymphs Abs: 555 cells/uL — ABNORMAL LOW (ref 850–3900)
MCH: 30.8 pg (ref 27.0–33.0)
MCHC: 33.6 g/dL (ref 32.0–36.0)
MCV: 91.7 fL (ref 80.0–100.0)
MONOS PCT: 1.9 %
MPV: 10.4 fL (ref 7.5–12.5)
NEUTROS ABS: 13739 {cells}/uL — AB (ref 1500–7800)
Neutrophils Relative %: 94.1 %
Platelets: 497 10*3/uL — ABNORMAL HIGH (ref 140–400)
RBC: 3.96 10*6/uL (ref 3.80–5.10)
RDW: 13.2 % (ref 11.0–15.0)
Total Lymphocyte: 3.8 %
WBC: 14.6 10*3/uL — ABNORMAL HIGH (ref 3.8–10.8)
WBCMIX: 277 {cells}/uL (ref 200–950)

## 2017-08-12 ENCOUNTER — Other Ambulatory Visit: Payer: Self-pay | Admitting: Neurology

## 2017-08-12 ENCOUNTER — Other Ambulatory Visit: Payer: Self-pay

## 2017-08-12 DIAGNOSIS — D729 Disorder of white blood cells, unspecified: Secondary | ICD-10-CM

## 2017-08-13 ENCOUNTER — Telehealth: Payer: Self-pay | Admitting: Neurology

## 2017-08-13 NOTE — Telephone Encounter (Signed)
Pt left a voicemail message asking for a call back from Ashley today, she has several questions about scheduling with [facility]

## 2017-08-14 ENCOUNTER — Other Ambulatory Visit: Payer: Self-pay | Admitting: Internal Medicine

## 2017-08-16 ENCOUNTER — Ambulatory Visit
Admission: RE | Admit: 2017-08-16 | Discharge: 2017-08-16 | Disposition: A | Discharge status: Home / Self Care | Payer: Medicare Other | Source: Ambulatory Visit | Attending: Neurology | Admitting: Neurology

## 2017-08-16 ENCOUNTER — Other Ambulatory Visit: Payer: Self-pay | Admitting: Neurology

## 2017-08-16 DIAGNOSIS — R29898 Other symptoms and signs involving the musculoskeletal system: Secondary | ICD-10-CM

## 2017-08-16 NOTE — Telephone Encounter (Signed)
Patient needs to talk to [facility] and would liek a call back tomorrow morning please call her

## 2017-08-17 NOTE — Telephone Encounter (Signed)
Dr. Posey Pronto is going to call patient.

## 2017-08-17 NOTE — Telephone Encounter (Signed)
Are you lowering this again?

## 2017-08-17 NOTE — Telephone Encounter (Signed)
Yes, reduce to 40mg  x 1 week, then continue to lower by 10mg  every week and stay on 20mg /d.  Rx sent.

## 2017-08-20 ENCOUNTER — Other Ambulatory Visit: Payer: Self-pay | Admitting: *Deleted

## 2017-08-20 DIAGNOSIS — G7000 Myasthenia gravis without (acute) exacerbation: Secondary | ICD-10-CM

## 2017-08-20 NOTE — Telephone Encounter (Signed)
Referral sent to [facility] and I will call patient and inform her that her IVIG appointment is on March 20 at 8:00.

## 2017-08-20 NOTE — Telephone Encounter (Signed)
Called patient to address questions. I also reviewed her MRI cervical spine results which shows diffuse cervical disc degeneration with multilevel mild-to-moderate neural foraminal stenosis, no abnormalities of neck extensor muscles or spinal cord.   Given that her head drop has not significantly improved with immunotherapy (high dose corticosteroids, IVIG, and plasmapheresis) as treatment for myasthenia, I will seek the opinion of a spine specialist to see if her cervical disease could be playing a role.    In the meantime, she will continue IVIG 0.4mg /kg every 4 weeks for a few more months, as I taper her prednisone by 10mg  each week, until she is on 10mg /d after which it will be tapered slower.    We will continue to seek a second opinion with neuromuscular specialist at a tertiary care center.  Donika K. Posey Pronto, DO

## 2017-08-23 ENCOUNTER — Ambulatory Visit: Payer: Medicare Other | Admitting: Nurse Practitioner

## 2017-08-23 ENCOUNTER — Encounter: Payer: Self-pay | Admitting: Nurse Practitioner

## 2017-08-23 VITALS — BP 160/78 | HR 70 | Temp 98.2°F | Ht 65.0 in | Wt 160.0 lb

## 2017-08-23 DIAGNOSIS — R011 Cardiac murmur, unspecified: Secondary | ICD-10-CM

## 2017-08-23 DIAGNOSIS — R143 Flatulence: Secondary | ICD-10-CM

## 2017-08-23 DIAGNOSIS — I10 Essential (primary) hypertension: Secondary | ICD-10-CM

## 2017-08-23 MED ORDER — NEBIVOLOL HCL 5 MG PO TABS: 5.0000 mg | ORAL_TABLET | Freq: Every day | ORAL | 2 refills | Status: DC

## 2017-08-23 NOTE — Progress Notes (Signed)
Careteam: Patient Care Team: Gayland Curry, DO as PCP - General (Geriatric Medicine) Loney Loh, MD (Dermatology) Lafayette Dragon, MD (Inactive) as Consulting Physician (Gastroenterology) Magrinat, Virgie Dad, MD as Consulting Physician (Oncology) Shon Hough, MD as Consulting Physician (Ophthalmology) Leta Baptist, MD as Consulting Physician (Otolaryngology) Rolm Bookbinder, MD as Consulting Physician (General Surgery) Irene Shipper, MD as Consulting Physician (Gastroenterology) Blima Rich, MD as Referring Physician (Obstetrics and Gynecology)  Advanced Directive information    Allergies  Allergen Reactions  . Gabapentin Other (See Comments)    Balance disturbance  . Cetacaine [Butamben-Tetracaine-Benzocaine] Other (See Comments)    LOST HER SENSE OF TASTE FOR OVER A MONTH!!!    Chief Complaint  Patient presents with  . Follow-up    Pt is being seen for a 2 week blood pressure check.      HPI: Patient is a 77 y.o. female seen in the office today to follow up blood pressure.  pts blood pressure had been elevated at multiple appts. She was following dash diet (but then reports she eats a lot of prepared meals and eats almonds with salt) and taking losartan 50 mg daily. Her losartan was increased to losartan 100 mg daily  Not taking blood pressure at home but has cuff she has brought to office.  States she is stressed due to all PT exercises but important for her to do her exercises.  Previously on norvasc which she had LE edema therefore it was stopped.  No chest pains or headaches.  Any exertion causing her to be worn out but once she rest she is able to recover easily but does not SOB on exertion.   Having a lot of Flatulence, eats a high fiber diet.  Review of Systems:  Review of Systems  Constitutional: Negative for chills, fever and malaise/fatigue.  Eyes: Negative.        No acute changes, some chronic issues noted with reading small print    Respiratory: Negative for cough and shortness of breath.   Cardiovascular: Negative for chest pain and palpitations.  Musculoskeletal: Negative for myalgias and neck pain.  Neurological: Positive for weakness (neck and head). Negative for dizziness, tingling and headaches.    Past Medical History:  Diagnosis Date  . Abnormality of gait   . Alopecia, unspecified   . Altered mental status   . Breast cancer ([place])    stage 1; right  . Cataracts, bilateral   . Decreased rectal sphincter tone 07/01/12  . Diverticulosis   . Enterocele   . Esophageal stricture 07/01/12  . Hernia   . Hiatal hernia   . Hypertension   . Irritable bowel syndrome   . Lumbago   . Migraine with aura, without mention of intractable migraine without mention of status migrainosus   . Mixed hearing loss, bilateral   . Myasthenia gravis ([place])   . 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 Osteoarthrosis, unspecified whether generalized or localized, unspecified site   . Other and unspecified hyperlipidemia   . Plantar fascial fibromatosis   . Rectal prolapse   . Reflux esophagitis   . Regional enteritis of small intestine ([place])   . Senile osteoporosis   . Spasm of muscle   . Unspecified constipation   . Unspecified hypothyroidism    Past Surgical History:  Procedure Laterality Date  . APPENDECTOMY    . BASAL CELL CARCINOMA EXCISION  1978   neck  . BREAST LUMPECTOMY Right    snbx, apbi  .  Cataract removal OD  06/28/2012   . Cataract removal OS  12/13/2012  . dermatolfibroma  2012  . DILATION AND CURETTAGE OF UTERUS    . ENTEROCELE REPAIR    . HAIR TRANSPLANT  2008  . HERNIA REPAIR    . IR FLUORO GUIDE CV LINE RIGHT  07/19/2017  . IR PATIENT EVAL TECH 0-60 MINS  07/29/2017  . IR US GUIDE VASC ACCESS RIGHT  07/19/2017  . OOPHORECTOMY    . strabisumus eye surgery     x 2  . TONSILLECTOMY     Social History:   reports that  has never smoked. she has never used smokeless tobacco. She reports  that she does not drink alcohol or use drugs.  Family History  Problem Relation Age of Onset  . Hypertension Mother   . Stroke Mother   . Alzheimer's disease Father   . Hypertension Brother   . Breast cancer Maternal Aunt   . Gallbladder disease Brother   . Colon cancer Neg Hx     Medications: Patient's Medications  New Prescriptions   No medications on file  Previous Medications   ACETAMINOPHEN (TYLENOL) 500 MG TABLET    Take 500 mg by mouth every 6 (six) hours as needed for mild pain.    ASPIRIN 81 MG TABLET    Take 81 mg by mouth daily.   CALCIUM-VITAMIN D PO    Take 1 capsule by mouth daily.   DIPHENHYDRAMINE (BENADRYL) 25 MG TABLET    Take 12.5-25 mg by mouth at bedtime as needed for sleep.    FLUTAMIDE (EULEXIN) 125 MG CAPSULE    Take 125 mg by mouth two times a day   HYOSCYAMINE (LEVSIN SL) 0.125 MG SL TABLET    DISSOLVE 1 TABLET UNDER TONGUE EVERY 4-6 HOURS AS NEEDED FOR DIARRHEA   LOSARTAN (COZAAR) 100 MG TABLET    Take 1 tablet (100 mg total) by mouth daily.   MINOXIDIL (LONITEN) 2.5 MG TABLET    1.25mg  by mouth daily   MULTIPLE VITAMINS-MINERALS (MULTIVITAMIN & MINERAL PO)    Take 1 tablet by mouth daily.   MUPIROCIN OINTMENT (BACTROBAN) 2 %    APPLY TO AREA ON RIGHT LOWER LEG DAILY UNTIL HEALED, COVER WITH BANDAID   NEOMYCIN-BACITRACIN-POLYMYXIN (HCA TRIPLE ANTIBIOTIC OINTMENT EX)    Apply 1 application topically daily as needed (irritation to nares).   PANTOPRAZOLE (PROTONIX) 40 MG TABLET    TAKE 1 TABLET BY MOUTH EVERY OTHER DAY   PREDNISONE (DELTASONE) 20 MG TABLET    Take 30 mg by mouth daily with breakfast.   PROBIOTIC PRODUCT (PROBIOTIC PO)    Take 1 tablet by mouth daily.   PYRIDOSTIGMINE (MESTINON) 60 MG TABLET    Take 1 tablet (60 mg total) by mouth See admin instructions. 60mg  by mouth twice daily - may take additional 60mg  at 7pm if needed for energy   TRAMADOL (ULTRAM) 50 MG TABLET    Take 1 tablet (50 mg total) daily as needed by mouth (severe neck pain).    VITAMIN C (ASCORBIC ACID) 500 MG TABLET    Take 500 mg by mouth daily.   WHEAT DEXTRIN-CALCIUM ([employer] E[employer]Y FIBER/CALCIUM PO)    Take 1 tablet by mouth daily.   Modified Medications   No medications on file  Discontinued Medications   PREDNISONE (DELTASONE) 20 MG TABLET    Take 2 tablet daily x 1 week, then 1.5 tab daily for one week, then 1 tablet daily and stay on 20mg /d.  Physical Exam:  Vitals:   08/23/17 1350  BP: (!) 160/78  Pulse: 70  Temp: 98.2 F (36.8 C)  TempSrc: Oral  SpO2: 98%  Weight: 160 lb (72.6 kg)  Height: 5\' 5"  (1.651 m)   Body mass index is 26.63 kg/m.  Physical Exam  Constitutional: She is oriented to person, place, and time. She appears well-developed and well-nourished. No distress.  HENT:  Head: Normocephalic and atraumatic.  Mouth/Throat: Oropharynx is clear and moist. No oropharyngeal exudate.  Eyes: Conjunctivae are normal. Pupils are equal, round, and reactive to light.  Neck: Normal range of motion. Neck supple.  Cardiovascular: Normal rate and regular rhythm.  Murmur heard.  Systolic murmur is present with a grade of 2/6. Pulmonary/Chest: Effort normal and breath sounds normal.  Musculoskeletal: She exhibits no edema or tenderness.  Neurological: She is alert and oriented to person, place, and time.  Skin: Skin is warm and dry. She is not diaphoretic.  Psychiatric: She has a normal mood and affect.    Labs reviewed: Basic Metabolic Panel: Recent Labs    05/21/17 1522  07/19/17 1150 07/19/17 1152  07/26/17 1134  07/28/17 1356 07/28/17 1440 08/10/17 1445  NA  --    < > 135  --    < > 140   < > 140 139 139  K  --    < > 3.7  --    < > 4.1   < > 3.8 3.8 4.3  CL  --    < > 100*  --    < > 105   < > 101 104 103  CO2  --    < > 21*  --    < > 24  --   --  25 25  GLUCOSE  --    < > 98  --    < > 148*   < > 148* 157* 125  BUN  --    < > 19  --    < > 15   < > 12 11 24   CREATININE  --    < > 0.92  --    < > 0.88   < > 0.80 0.90 0.95*    CALCIUM  --    < > 9.2  --    < > 9.4  --   --  9.4 9.7  MG  --   --  2.1  --   --   --   --   --   --   --   PHOS  --   --  3.2  --   --   --   --   --   --   --   TSH 2.57  --   --  0.556  --   --   --   --   --   --    < > = values in this interval not displayed.   Liver Function Tests: Recent Labs    07/24/17 1737 07/26/17 1134 07/28/17 1440  AST 51* 42* 31  ALT 38 30 22  ALKPHOS 34* 36* 37*  BILITOT 0.7 0.8 0.7  PROT 5.4* 5.9* 5.8*  ALBUMIN 3.7 4.4 4.1   No results for input(s): LIPASE, AMYLASE in the last 8760 hours. No results for input(s): AMMONIA in the last 8760 hours. CBC: Recent Labs    05/15/17 1917 07/19/17 1150  07/26/17 1134  07/28/17 0652 07/28/17 1356 08/10/17 1445  WBC 7.9 9.7   < >  14.1*  --  10.9*  --  14.6*  NEUTROABS 5.5 8.7*  --   --   --   --   --  13,739*  HGB 13.2 13.0   < > 12.8   < > 10.7* 12.9 12.2  HCT 39.7 40.6   < > 39.6   < > 32.7* 38.0 36.3  MCV 93.6 95.8   < > 95.7  --  96.7  --  91.7  PLT 382 311   < > 191  --  154  --  497*   < > = values in this interval not displayed.   Lipid Panel: Recent Labs    09/28/16 1000  CHOL 195  HDL 53  LDLCALC 125*  TRIG 85  CHOLHDL 3.7   TSH: Recent Labs    05/21/17 1522 07/19/17 1152  TSH 2.57 0.556   A1C: Lab Results  Component Value Date   HGBA1C 5.7 07/06/2017     Assessment/Plan 1. Essential hypertension -not at goal, again DASH diet discuss, pt talks more about her diet today and does not appear she is following low sodium diet. Education again provided. To continue losartan and will add bystolic  - nebivolol (BYSTOLIC) 5 MG tablet; Take 1 tablet (5 mg total) by mouth daily.  Dispense: 30 tablet; Refill: 2  2. Murmur, cardiac - ECHOCARDIOGRAM COMPLETE; Future  3. Flatulence -lifestyle/diet modifications encouraged, information given.  Next appt: 2-3 weeks, sooner if needed, with blood pressure and HR readings.  Carlos American. [place], [facility] [phone]

## 2017-08-23 NOTE — Patient Instructions (Addendum)
To continue losartan 100 mg daily To start bystolic 5 mg daily   Abdominal Bloating When you have abdominal bloating, your abdomen may feel full, tight, or painful. It may also look bigger than normal or swollen (distended). Common causes of abdominal bloating include:  Swallowing air.  Constipation.  Problems digesting food.  Eating too much.  Irritable bowel syndrome. This is a condition that affects the large intestine.  Lactose intolerance. This is an inability to digest lactose, a natural sugar in dairy products.  Celiac disease. This is a condition that affects the ability to digest gluten, a protein found in some grains.  Gastroparesis. This is a condition that slows down the movement of food in the stomach and small intestine. It is more common in people with diabetes mellitus.  Gastroesophageal reflux disease (GERD). This is a digestive condition that makes stomach acid flow back into the esophagus.  Urinary retention. This means that the body is holding onto urine, and the bladder cannot be emptied all the way.  Follow these instructions at home: Eating and drinking  Avoid eating too much.  Try not to swallow air while talking or eating.  Avoid eating while lying down.  Avoid these foods and drinks: ? Foods that cause gas, such as broccoli, cabbage, cauliflower, and baked beans. ? Carbonated drinks. ? Hard candy. ? Chewing gum. Medicines  Take over-the-counter and prescription medicines only as told by your health care provider.  Take probiotic medicines. These medicines contain live bacteria or yeasts that can help digestion.  Take coated peppermint oil capsules. Activity  Try to exercise regularly. Exercise may help to relieve bloating that is caused by gas and relieve constipation. General instructions  Keep all follow-up visits as told by your health care provider. This is important. Contact a health care provider if:  You have nausea and  vomiting.  You have diarrhea.  You have abdominal pain.  You have unusual weight loss or weight gain.  You have severe pain, and medicines do not help. Get help right away if:  You have severe chest pain.  You have trouble breathing.  You have shortness of breath.  You have trouble urinating.  You have darker urine than normal.  You have blood in your stools or have dark, tarry stools. Summary  Abdominal bloating means that the abdomen is swollen.  Common causes of abdominal bloating are swallowing air, constipation, and problems digesting food.  Avoid eating too much and avoid swallowing air.  Avoid foods that cause gas, carbonated drinks, hard candy, and chewing gum. This information is not intended to replace advice given to you by your health care provider. Make sure you discuss any questions you have with your health care provider. Document Released: 06/26/2016 Document Revised: 06/26/2016 Document Reviewed: 06/26/2016 Elsevier Interactive Patient Education  2018 [place] Eating Plan DASH stands for "Dietary Approaches to Stop Hypertension." The DASH eating plan is a healthy eating plan that has been shown to reduce high blood pressure (hypertension). It may also reduce your risk for type 2 diabetes, heart disease, and stroke. The DASH eating plan may also help with weight loss. What are tips for following this plan? General guidelines  Avoid eating more than 2,300 mg (milligrams) of salt (sodium) a day. If you have hypertension, you may need to reduce your sodium intake to 1,500 mg a day.  Limit alcohol intake to no more than 1 drink a day for nonpregnant women and 2 drinks a day for men.  One drink equals 12 oz of beer, 5 oz of wine, or 1 oz of hard liquor.  Work with your health care provider to maintain a healthy body weight or to lose weight. Ask what an ideal weight is for you.  Get at least 30 minutes of exercise that causes your heart to beat  faster (aerobic exercise) most days of the week. Activities may include walking, swimming, or biking.  Work with your health care provider or diet and nutrition specialist (dietitian) to adjust your eating plan to your individual calorie needs. Reading food labels  Check food labels for the amount of sodium per serving. Choose foods with less than 5 percent of the Daily Value of sodium. Generally, foods with less than 300 mg of sodium per serving fit into this eating plan.  To find whole grains, look for the word "whole" as the first word in the ingredient list. Shopping  Buy products labeled as "low-sodium" or "no salt added."  Buy fresh foods. Avoid canned foods and premade or frozen meals. Cooking  Avoid adding salt when cooking. Use salt-free seasonings or herbs instead of table salt or sea salt. Check with your health care provider or pharmacist before using salt substitutes.  Do not fry foods. Cook foods using healthy methods such as baking, boiling, grilling, and broiling instead.  Cook with heart-healthy oils, such as olive, canola, soybean, or sunflower oil. Meal planning   Eat a balanced diet that includes: ? 5 or more servings of fruits and vegetables each day. At each meal, try to fill half of your plate with fruits and vegetables. ? Up to 6-8 servings of whole grains each day. ? Less than 6 oz of lean meat, poultry, or fish each day. A 3-oz serving of meat is about the same size as a deck of cards. One egg equals 1 oz. ? 2 servings of low-fat dairy each day. ? A serving of nuts, seeds, or beans 5 times each week. ? Heart-healthy fats. Healthy fats called Omega-3 fatty acids are found in foods such as flaxseeds and coldwater fish, like sardines, salmon, and mackerel.  Limit how much you eat of the following: ? Canned or prepackaged foods. ? Food that is high in trans fat, such as fried foods. ? Food that is high in saturated fat, such as fatty meat. ? Sweets, desserts,  sugary drinks, and other foods with added sugar. ? Full-fat dairy products.  Do not salt foods before eating.  Try to eat at least 2 vegetarian meals each week.  Eat more home-cooked food and less restaurant, buffet, and fast food.  When eating at a restaurant, ask that your food be prepared with less salt or no salt, if possible. What foods are recommended? The items listed may not be a complete list. Talk with your dietitian about what dietary choices are best for you. Grains Whole-grain or whole-wheat bread. Whole-grain or whole-wheat pasta. Brown rice. [place]. Bulgur. Whole-grain and low-sodium cereals. Pita bread. Low-fat, low-sodium crackers. Whole-wheat flour tortillas. Vegetables Fresh or frozen vegetables (raw, steamed, roasted, or grilled). Low-sodium or reduced-sodium tomato and vegetable juice. Low-sodium or reduced-sodium tomato sauce and tomato paste. Low-sodium or reduced-sodium canned vegetables. Fruits All fresh, dried, or frozen fruit. Canned fruit in natural juice (without added sugar). Meat and other protein foods Skinless chicken or [place]. Ground chicken or [place]. Pork with fat trimmed off. Fish and seafood. Egg whites. Dried beans, peas, or lentils. Unsalted nuts, nut butters, and seeds. Unsalted canned beans.  Lean cuts of beef with fat trimmed off. Low-sodium, lean deli meat. Dairy Low-fat (1%) or fat-free (skim) milk. Fat-free, low-fat, or reduced-fat cheeses. Nonfat, low-sodium ricotta or cottage cheese. Low-fat or nonfat yogurt. Low-fat, low-sodium cheese. Fats and oils Soft margarine without trans fats. Vegetable oil. Low-fat, reduced-fat, or light mayonnaise and salad dressings (reduced-sodium). Canola, safflower, olive, soybean, and sunflower oils. Avocado. Seasoning and other foods Herbs. Spices. Seasoning mixes without salt. Unsalted popcorn and pretzels. Fat-free sweets. What foods are not recommended? The items listed may not be a complete list.  Talk with your dietitian about what dietary choices are best for you. Grains Baked goods made with fat, such as croissants, muffins, or some breads. Dry pasta or rice meal packs. Vegetables Creamed or fried vegetables. Vegetables in a cheese sauce. Regular canned vegetables (not low-sodium or reduced-sodium). Regular canned tomato sauce and paste (not low-sodium or reduced-sodium). Regular tomato and vegetable juice (not low-sodium or reduced-sodium). Angie Fava. Olives. Fruits Canned fruit in a light or heavy syrup. Fried fruit. Fruit in cream or butter sauce. Meat and other protein foods Fatty cuts of meat. Ribs. Fried meat. Berniece Salines. Sausage. Bologna and other processed lunch meats. Salami. Fatback. Hotdogs. Bratwurst. Salted nuts and seeds. Canned beans with added salt. Canned or smoked fish. Whole eggs or egg yolks. Chicken or [place] with skin. Dairy Whole or 2% milk, cream, and half-and-half. Whole or full-fat cream cheese. Whole-fat or sweetened yogurt. Full-fat cheese. Nondairy creamers. Whipped toppings. Processed cheese and cheese spreads. Fats and oils Butter. Stick margarine. Lard. Shortening. Ghee. Bacon fat. Tropical oils, such as coconut, palm kernel, or palm oil. Seasoning and other foods Salted popcorn and pretzels. Onion salt, garlic salt, seasoned salt, table salt, and sea salt. Worcestershire sauce. Tartar sauce. Barbecue sauce. Teriyaki sauce. Soy sauce, including reduced-sodium. Steak sauce. Canned and packaged gravies. Fish sauce. Oyster sauce. Cocktail sauce. Horseradish that you find on the shelf. Ketchup. Mustard. Meat flavorings and tenderizers. Bouillon cubes. Hot sauce and Tabasco sauce. Premade or packaged marinades. Premade or packaged taco seasonings. Relishes. Regular salad dressings. Where to find more information:  National Heart, Lung, and [place]: https://wilson-eaton.com/  American Heart Association: www.heart.org Summary  The DASH eating plan is a healthy eating  plan that has been shown to reduce high blood pressure (hypertension). It may also reduce your risk for type 2 diabetes, heart disease, and stroke.  With the DASH eating plan, you should limit salt (sodium) intake to 2,300 mg a day. If you have hypertension, you may need to reduce your sodium intake to 1,500 mg a day.  When on the DASH eating plan, aim to eat more fresh fruits and vegetables, whole grains, lean proteins, low-fat dairy, and heart-healthy fats.  Work with your health care provider or diet and nutrition specialist (dietitian) to adjust your eating plan to your individual calorie needs. This information is not intended to replace advice given to you by your health care provider. Make sure you discuss any questions you have with your health care provider. Document Released: 05/14/2011 Document Revised: 05/18/2016 Document Reviewed: 05/18/2016 Elsevier Interactive Patient Education  [employer].

## 2017-08-24 ENCOUNTER — Telehealth: Payer: Self-pay | Admitting: Neurology

## 2017-08-24 ENCOUNTER — Other Ambulatory Visit (HOSPITAL_COMMUNITY): Payer: Self-pay | Admitting: *Deleted

## 2017-08-24 NOTE — Telephone Encounter (Signed)
Patient called back thinking you had called. She said she will be unavailable after lunch. Thanks

## 2017-08-24 NOTE — Telephone Encounter (Signed)
I called patient back and gave her the # to short stay so that she can call and reschedule her appointment.

## 2017-08-24 NOTE — Telephone Encounter (Signed)
Patient Lmom that she will need to reschedule her Infusion for tomorrow and reschedule for another time. Please Call. Thanks

## 2017-08-25 ENCOUNTER — Inpatient Hospital Stay (HOSPITAL_COMMUNITY): Admission: RE | Admit: 2017-08-25 | Payer: Medicare Other | Source: Ambulatory Visit

## 2017-08-30 ENCOUNTER — Telehealth: Payer: Self-pay | Admitting: *Deleted

## 2017-08-30 ENCOUNTER — Telehealth: Payer: Self-pay | Admitting: Internal Medicine

## 2017-08-30 ENCOUNTER — Telehealth: Payer: Self-pay | Admitting: Neurology

## 2017-08-30 NOTE — Telephone Encounter (Signed)
Left msg asking pt to confirm this AWV-S w/ nurse first. Last AWV 09/28/16. VDM (DD)

## 2017-08-30 NOTE — Telephone Encounter (Signed)
Patient sister wants to talk to someone about her sister per the voicemail she will be available after 12:00 today. I tried calling to see what it was about but got no answer

## 2017-08-30 NOTE — Telephone Encounter (Signed)
Pt left a voicemail message wanting Dr Posey Pronto to know she is scheduled at [facility] on 09/13/2017

## 2017-08-30 NOTE — Telephone Encounter (Signed)
Patient called and stated that she thought you had ordered a Echocardiogram at her last appointment but she hasn't heard anything regarding an appointment. Patient is wanting to know if this has been ordered. Thought she could have done before April 8th, going to [facility] for 2nd opinion on Myasthenia Gravis so they could have the results. Please Advise.

## 2017-08-31 NOTE — Telephone Encounter (Signed)
Noted  

## 2017-08-31 NOTE — Telephone Encounter (Signed)
Referral being worked currently

## 2017-09-01 ENCOUNTER — Ambulatory Visit (HOSPITAL_COMMUNITY)
Admission: RE | Admit: 2017-09-01 | Discharge: 2017-09-01 | Disposition: A | Discharge status: Home / Self Care | Payer: Medicare Other | Source: Ambulatory Visit | Attending: Neurology | Admitting: Neurology

## 2017-09-01 ENCOUNTER — Telehealth: Payer: Self-pay | Admitting: *Deleted

## 2017-09-01 DIAGNOSIS — G7000 Myasthenia gravis without (acute) exacerbation: Secondary | ICD-10-CM | POA: Diagnosis present

## 2017-09-01 MED ORDER — IMMUNE GLOBULIN (HUMAN) 20 GM/200ML IV SOLN
400.0000 mg/kg | INTRAVENOUS | Status: DC
  Administered 2017-09-01: 09:00:00 30 g via INTRAVENOUS
  Filled 2017-09-01: qty 100

## 2017-09-01 NOTE — Telephone Encounter (Signed)
Per office manager, Caren Griffins, patient was notified of appointment time and date.

## 2017-09-01 NOTE — Telephone Encounter (Signed)
I left patient a message asking that she call the office to discuss the date and time of echocardiogram.   Echocardiogram is scheduled 09-03-17 at 2 pm -[facility]

## 2017-09-01 NOTE — Telephone Encounter (Signed)
1. Patient called and stated that Janett Billow gave her another BP medication at last OV, Bystolic. Patient accidentally took 2 tablets this afternoon.Patient states she feels fine. Patient wants to know if it is ok just to get back on schedule tomorrow or should she change anything.  2. Patient is also wanting to know if she should have her CBC recheck to check her WBC, stated it was elevated at last draw.    Please Advise.

## 2017-09-01 NOTE — Telephone Encounter (Signed)
It should be fine to resume tomorrow as normal. And yes plan to recheck- see last lab note- cbc has already been scheduled

## 2017-09-01 NOTE — Telephone Encounter (Signed)
Patient agreed. Lab Appointment scheduled.

## 2017-09-03 ENCOUNTER — Other Ambulatory Visit (HOSPITAL_COMMUNITY): Payer: Medicare Other

## 2017-09-03 ENCOUNTER — Other Ambulatory Visit: Payer: Medicare Other

## 2017-09-03 ENCOUNTER — Telehealth: Payer: Self-pay | Admitting: Neurology

## 2017-09-03 DIAGNOSIS — D729 Disorder of white blood cells, unspecified: Secondary | ICD-10-CM

## 2017-09-03 LAB — CBC
HEMATOCRIT: 33.9 % — AB (ref 35.0–45.0)
Hemoglobin: 11.2 g/dL — ABNORMAL LOW (ref 11.7–15.5)
MCH: 29.6 pg (ref 27.0–33.0)
MCHC: 33 g/dL (ref 32.0–36.0)
MCV: 89.7 fL (ref 80.0–100.0)
MPV: 10.3 fL (ref 7.5–12.5)
PLATELETS: 454 10*3/uL — AB (ref 140–400)
RBC: 3.78 10*6/uL — ABNORMAL LOW (ref 3.80–5.10)
RDW: 13.4 % (ref 11.0–15.0)
WBC: 11.4 10*3/uL — AB (ref 3.8–10.8)

## 2017-09-03 NOTE — Telephone Encounter (Signed)
Patient states that her prednisone has different directions than what Dr Posey Pronto told her and she needs to know which is correct. She will be home after 4:00 today. If you can t get her today and call Monday she will need a call in the morning before 12:30

## 2017-09-03 NOTE — Telephone Encounter (Signed)
No recent documentation on Prednisone in chart.   Caryl Pina - can you address?

## 2017-09-06 ENCOUNTER — Other Ambulatory Visit (HOSPITAL_COMMUNITY): Payer: Medicare Other

## 2017-09-06 NOTE — Telephone Encounter (Signed)
Left message for patient to call me back with prednisone directions that she has.

## 2017-09-07 ENCOUNTER — Telehealth: Payer: Self-pay | Admitting: Neurology

## 2017-09-07 NOTE — Telephone Encounter (Signed)
Patient Marilyn [name] that she is having some new health issues that she would like for Dr. Posey Pronto to know. Also, She will be going to [facility] on Monday 09/13/17 for a consult. She left the name of the medication that she was calling about but could not make out the name. She is wanting to confirm the directions as to what she was told and to what the directions say. Please Call. Thanks

## 2017-09-07 NOTE — Telephone Encounter (Signed)
Patient had weaned down to prednisone 10 mg daily but the new Rx said something different.  Instructed her to go back to the 10 mg daily per Dr. Posey Pronto.

## 2017-09-09 ENCOUNTER — Ambulatory Visit (HOSPITAL_COMMUNITY): Payer: Medicare Other | Attending: Cardiology

## 2017-09-09 ENCOUNTER — Telehealth: Payer: Self-pay | Admitting: *Deleted

## 2017-09-09 ENCOUNTER — Other Ambulatory Visit: Payer: Self-pay

## 2017-09-09 DIAGNOSIS — R011 Cardiac murmur, unspecified: Secondary | ICD-10-CM | POA: Diagnosis present

## 2017-09-09 DIAGNOSIS — I351 Nonrheumatic aortic (valve) insufficiency: Secondary | ICD-10-CM | POA: Insufficient documentation

## 2017-09-09 DIAGNOSIS — Z853 Personal history of malignant neoplasm of breast: Secondary | ICD-10-CM | POA: Insufficient documentation

## 2017-09-09 DIAGNOSIS — I119 Hypertensive heart disease without heart failure: Secondary | ICD-10-CM | POA: Insufficient documentation

## 2017-09-09 NOTE — Telephone Encounter (Signed)
Patient called and wanted to know if she should continue taking her Low Dose Aspirin with all the Publicity with heart disease. Please Advise.

## 2017-09-14 ENCOUNTER — Encounter: Payer: Self-pay | Admitting: Nurse Practitioner

## 2017-09-14 ENCOUNTER — Ambulatory Visit: Payer: Medicare Other | Admitting: Nurse Practitioner

## 2017-09-14 VITALS — BP 132/74 | HR 85 | Temp 98.1°F | Ht 65.0 in | Wt 154.0 lb

## 2017-09-14 DIAGNOSIS — D649 Anemia, unspecified: Secondary | ICD-10-CM

## 2017-09-14 DIAGNOSIS — I10 Essential (primary) hypertension: Secondary | ICD-10-CM | POA: Diagnosis not present

## 2017-09-14 DIAGNOSIS — R011 Cardiac murmur, unspecified: Secondary | ICD-10-CM

## 2017-09-14 LAB — BASIC METABOLIC PANEL WITH GFR
BUN/Creatinine Ratio: 24 (calc) — ABNORMAL HIGH (ref 6–22)
BUN: 24 mg/dL (ref 7–25)
CHLORIDE: 103 mmol/L (ref 98–110)
CO2: 29 mmol/L (ref 20–32)
Calcium: 9.7 mg/dL (ref 8.6–10.4)
Creat: 1.01 mg/dL — ABNORMAL HIGH (ref 0.60–0.93)
GFR, Est African American: 62 mL/min/{1.73_m2} (ref >=60)
GFR, Est Non African American: 54 mL/min/{1.73_m2} — ABNORMAL LOW (ref >=60)
GLUCOSE: 92 mg/dL (ref 65–139)
Potassium: 4.5 mmol/L (ref 3.5–5.3)
Sodium: 138 mmol/L (ref 135–146)

## 2017-09-14 LAB — CBC WITH DIFFERENTIAL/PLATELET
BASOS PCT: 0.9 %
Basophils Absolute: 116 cells/uL (ref 0–200)
EOS ABS: 65 {cells}/uL (ref 15–500)
Eosinophils Relative: 0.5 %
HCT: 34.6 % — ABNORMAL LOW (ref 35.0–45.0)
Hemoglobin: 11.6 g/dL — ABNORMAL LOW (ref 11.7–15.5)
Lymphs Abs: 1200 cells/uL (ref 850–3900)
MCH: 29.7 pg (ref 27.0–33.0)
MCHC: 33.5 g/dL (ref 32.0–36.0)
MCV: 88.7 fL (ref 80.0–100.0)
MONOS PCT: 6.7 %
MPV: 10.3 fL (ref 7.5–12.5)
Neutro Abs: 10655 cells/uL — ABNORMAL HIGH (ref 1500–7800)
Neutrophils Relative %: 82.6 %
PLATELETS: 529 10*3/uL — AB (ref 140–400)
RBC: 3.9 10*6/uL (ref 3.80–5.10)
RDW: 13.6 % (ref 11.0–15.0)
TOTAL LYMPHOCYTE: 9.3 %
WBC mixed population: 864 cells/uL (ref 200–950)
WBC: 12.9 10*3/uL — AB (ref 3.8–10.8)

## 2017-09-14 NOTE — Progress Notes (Signed)
Careteam: Patient Care Team: Gayland Curry, DO as PCP - General (Geriatric Medicine) Loney Loh, MD (Dermatology) Lafayette Dragon, MD (Inactive) as Consulting Physician (Gastroenterology) Magrinat, Virgie Dad, MD as Consulting Physician (Oncology) Shon Hough, MD as Consulting Physician (Ophthalmology) Leta Baptist, MD as Consulting Physician (Otolaryngology) Rolm Bookbinder, MD as Consulting Physician (General Surgery) Irene Shipper, MD as Consulting Physician (Gastroenterology) Blima Rich, MD as Referring Physician (Obstetrics and Gynecology)  Advanced Directive information    Allergies  Allergen Reactions  . Gabapentin Other (See Comments)    Balance disturbance  . Cetacaine [Butamben-Tetracaine-Benzocaine] Other (See Comments)    LOST HER SENSE OF TASTE FOR OVER A MONTH!!!    Chief Complaint  Patient presents with  . Follow-up    Pt is being seen to follow up on blood pressure.      HPI: Patient is a 77 y.o. female seen in the office today for follow up on blood pressure.  Pts blood pressure had remained elevated at multiple appts therefore losartan was increased from 50 mg to 100 mg at her return visit blood pressure was not at goal therefore bystolic 5 mg daily was added. Has not been taking blood pressure/HR at home.  Just has not had time. Has gotten new batteries for her machine.  Yesterday her blood pressure was 102/48 but she reports that she does not think they told her this number.  No adverse effects noted from medication changes.   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 Going back in May for further workup. Having a biopsy of her muscle tissues in her neck.   hgb down from 12.2 to 11.2 on recent labs. No significant bleeding noted. Notices small blood on her toilet tissue between visit but no dark tarry stools. No vaginal bleeding. No abnormal  bruising.   Wbc trended down- also weaning down on prednisone as well, plans to call neurology to see if she needs to continue weaning.   Review of Systems:  Review of Systems  Constitutional: Negative for chills, fever and malaise/fatigue.  Eyes: Negative.   Respiratory: Negative for cough and shortness of breath.   Cardiovascular: Negative for chest pain, palpitations and leg swelling.  Musculoskeletal: Negative for myalgias and neck pain.  Skin: Negative for itching and rash.  Neurological: Positive for weakness (neck and head). Negative for dizziness, tingling and headaches.    Past Medical History:  Diagnosis Date  . Abnormality of gait   . Alopecia, unspecified   . Altered mental status   . Breast cancer ([place])    stage 1; right  . Cataracts, bilateral   . Decreased rectal sphincter tone 07/01/12  . Diverticulosis   . Enterocele   . Esophageal stricture 07/01/12  . Hernia   . Hiatal hernia   . Hypertension   . Irritable bowel syndrome   . Lumbago   . Migraine with aura, without mention of intractable migraine without mention of status migrainosus   . Mixed hearing loss, bilateral   . Myasthenia gravis ([place])   . 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 Osteoarthrosis, unspecified whether generalized or localized, unspecified site   . Other and unspecified hyperlipidemia   . Plantar fascial fibromatosis   . Rectal prolapse   . Reflux esophagitis   . Regional enteritis of small intestine ([place])   . Senile osteoporosis   . Spasm of muscle   .  Unspecified constipation   . Unspecified hypothyroidism    Past Surgical History:  Procedure Laterality Date  . APPENDECTOMY    . BASAL CELL CARCINOMA EXCISION  1978   neck  . BREAST LUMPECTOMY Right    snbx, apbi  . Cataract removal OD  06/28/2012   . Cataract removal OS  12/13/2012  . dermatolfibroma  2012  . DILATION AND CURETTAGE OF UTERUS    . ENTEROCELE REPAIR    . HAIR TRANSPLANT  2008  . HERNIA REPAIR     . IR FLUORO GUIDE CV LINE RIGHT  07/19/2017  . IR PATIENT EVAL TECH 0-60 MINS  07/29/2017  . IR US GUIDE VASC ACCESS RIGHT  07/19/2017  . OOPHORECTOMY    . strabisumus eye surgery     x 2  . TONSILLECTOMY     Social History:   reports that she has never smoked. She has never used smokeless tobacco. She reports that she does not drink alcohol or use drugs.  Family History  Problem Relation Age of Onset  . Hypertension Mother   . Stroke Mother   . Alzheimer's disease Father   . Hypertension Brother   . Breast cancer Maternal Aunt   . Gallbladder disease Brother   . Colon cancer Neg Hx     Medications: Patient's Medications  New Prescriptions   No medications on file  Previous Medications   ACETAMINOPHEN (TYLENOL) 500 MG TABLET    Take 500 mg by mouth every 6 (six) hours as needed for mild pain.    ASPIRIN 81 MG TABLET    Take 81 mg by mouth daily.   CALCIUM-VITAMIN D PO    Take 1 capsule by mouth daily.   DIPHENHYDRAMINE (BENADRYL) 25 MG TABLET    Take 12.5-25 mg by mouth at bedtime as needed for sleep.    FLUTAMIDE (EULEXIN) 125 MG CAPSULE    Take 125 mg by mouth two times a day   HYOSCYAMINE (LEVSIN SL) 0.125 MG SL TABLET    DISSOLVE 1 TABLET UNDER TONGUE EVERY 4-6 HOURS AS NEEDED FOR DIARRHEA   LOSARTAN (COZAAR) 100 MG TABLET    Take 1 tablet (100 mg total) by mouth daily.   MINOXIDIL (LONITEN) 2.5 MG TABLET    1.25mg  by mouth daily   MULTIPLE VITAMINS-MINERALS (MULTIVITAMIN & MINERAL PO)    Take 1 tablet by mouth daily.   MUPIROCIN OINTMENT (BACTROBAN) 2 %    APPLY TO AREA ON RIGHT LOWER LEG DAILY UNTIL HEALED, COVER WITH BANDAID   NEBIVOLOL (BYSTOLIC) 5 MG TABLET    Take 1 tablet (5 mg total) by mouth daily.   NEOMYCIN-BACITRACIN-POLYMYXIN (HCA TRIPLE ANTIBIOTIC OINTMENT EX)    Apply 1 application topically daily as needed (irritation to nares).   PANTOPRAZOLE (PROTONIX) 40 MG TABLET    TAKE 1 TABLET BY MOUTH EVERY OTHER DAY   PREDNISONE (DELTASONE) 10 MG TABLET    Take 10  mg by mouth daily with breakfast.   PROBIOTIC PRODUCT (PROBIOTIC PO)    Take 1 tablet by mouth daily.   PYRIDOSTIGMINE (MESTINON) 60 MG TABLET    Take 1 tablet (60 mg total) by mouth See admin instructions. 60mg  by mouth twice daily - may take additional 60mg  at 7pm if needed for energy   TRAMADOL (ULTRAM) 50 MG TABLET    Take 1 tablet (50 mg total) daily as needed by mouth (severe neck pain).   VITAMIN C (ASCORBIC ACID) 500 MG TABLET    Take 500 mg by  mouth daily.   WHEAT DEXTRIN-CALCIUM ([employer] EASY FIBER/CALCIUM PO)    Take 1 tablet by mouth daily.   Modified Medications   No medications on file  Discontinued Medications   PREDNISONE (DELTASONE) 20 MG TABLET    Take 30 mg by mouth daily with breakfast.     Physical Exam:  Vitals:   09/14/17 1314  BP: 132/74  Pulse: 85  Temp: 98.1 F (36.7 C)  TempSrc: Oral  SpO2: 98%  Weight: 154 lb (69.9 kg)  Height: 5\' 5"  (1.651 m)   Body mass index is 25.63 kg/m.  Physical Exam  Constitutional: She is oriented to person, place, and time. She appears well-developed and well-nourished. No distress.  HENT:  Head: Normocephalic and atraumatic.  Mouth/Throat: Oropharynx is clear and moist. No oropharyngeal exudate.  Eyes: Pupils are equal, round, and reactive to light. Conjunctivae are normal.  Neck: Normal range of motion. Neck supple.  Cardiovascular: Normal rate and regular rhythm.  Murmur heard.  Systolic murmur is present with a grade of 2/6. Pulmonary/Chest: Effort normal and breath sounds normal.  Musculoskeletal: She exhibits no edema or tenderness.  Neurological: She is alert and oriented to person, place, and time.  Skin: Skin is warm and dry. She is not diaphoretic.  Psychiatric: She has a normal mood and affect.   Labs reviewed: Basic Metabolic Panel: Recent Labs    05/21/17 1522  07/19/17 1150 07/19/17 1152  07/26/17 1134  07/28/17 1356 07/28/17 1440 08/10/17 1445  NA  --    < > 135  --    < > 140   < > 140 139 139    K  --    < > 3.7  --    < > 4.1   < > 3.8 3.8 4.3  CL  --    < > 100*  --    < > 105   < > 101 104 103  CO2  --    < > 21*  --    < > 24  --   --  25 25  GLUCOSE  --    < > 98  --    < > 148*   < > 148* 157* 125  BUN  --    < > 19  --    < > 15   < > 12 11 24   CREATININE  --    < > 0.92  --    < > 0.88   < > 0.80 0.90 0.95*  CALCIUM  --    < > 9.2  --    < > 9.4  --   --  9.4 9.7  MG  --   --  2.1  --   --   --   --   --   --   --   PHOS  --   --  3.2  --   --   --   --   --   --   --   TSH 2.57  --   --  0.556  --   --   --   --   --   --    < > = values in this interval not displayed.   Liver Function Tests: Recent Labs    07/24/17 1737 07/26/17 1134 07/28/17 1440  AST 51* 42* 31  ALT 38 30 22  ALKPHOS 34* 36* 37*  BILITOT 0.7 0.8 0.7  PROT 5.4* 5.9* 5.8*  ALBUMIN  3.7 4.4 4.1   No results for input(s): LIPASE, AMYLASE in the last 8760 hours. No results for input(s): AMMONIA in the last 8760 hours. CBC: Recent Labs    05/15/17 1917 07/19/17 1150  07/28/17 0652 07/28/17 1356 08/10/17 1445 09/03/17 1140  WBC 7.9 9.7   < > 10.9*  --  14.6* 11.4*  NEUTROABS 5.5 8.7*  --   --   --  13,739*  --   HGB 13.2 13.0   < > 10.7* 12.9 12.2 11.2*  HCT 39.7 40.6   < > 32.7* 38.0 36.3 33.9*  MCV 93.6 95.8   < > 96.7  --  91.7 89.7  PLT 382 311   < > 154  --  497* 454*   < > = values in this interval not displayed.   Lipid Panel: Recent Labs    09/28/16 1000  CHOL 195  HDL 53  LDLCALC 125*  TRIG 85  CHOLHDL 3.7   TSH: Recent Labs    05/21/17 1522 07/19/17 1152  TSH 2.57 0.556   A1C: Lab Results  Component Value Date   HGBA1C 5.7 07/06/2017     Assessment/Plan 1. Anemia, unspecified type -hgb down from previous, no signs of bleeding. Will follow up CBC today.  - CBC with Differential/Platelets  2. Essential hypertension Improved on current regimen, will cont current medication as prescribed.  - BASIC METABOLIC PANEL WITH GFR  3. Murmur, cardiac Reviewed  echo with pt, will continue to monitor at this time  Next appt: 10/07/2017 Marilyn [name], [facility] [phone]

## 2017-09-14 NOTE — Patient Instructions (Signed)
Continue medication as ordered  Will get follow up lab work today, may need to provide stool sample  To keep follow up appt

## 2017-09-15 ENCOUNTER — Other Ambulatory Visit: Payer: Self-pay | Admitting: Neurology

## 2017-09-16 ENCOUNTER — Other Ambulatory Visit: Payer: Self-pay

## 2017-09-16 DIAGNOSIS — D649 Anemia, unspecified: Secondary | ICD-10-CM

## 2017-09-16 NOTE — Telephone Encounter (Signed)
Given her history and current conditions, aspirin is not necessary for her based on the latest ACC/AHA guidelines.

## 2017-09-17 NOTE — Telephone Encounter (Signed)
Spoke with patient and advised results   

## 2017-09-22 ENCOUNTER — Telehealth: Payer: Self-pay | Admitting: Neurology

## 2017-09-22 NOTE — Telephone Encounter (Signed)
Patient lmom regarding the next step and how to proceed after 2nd opinion at [facility]. Please Call. Thanks

## 2017-09-27 ENCOUNTER — Encounter: Payer: Self-pay | Admitting: Neurology

## 2017-09-27 NOTE — Telephone Encounter (Signed)
I spoke with patient and she said that [facility] wanted her to follow up with you.  She wants to know if she should stay on same meds and have her upcoming infusion even though she may not have MG.  She will not get results from [facility] until June.  Please advise.

## 2017-09-27 NOTE — Telephone Encounter (Signed)
Reviewed records from [facility], patient was evaluated by Dr. Nanine Means who repeated NCS/EMG which shows myopathic findings and will be undergoing deltoid muscle biopsy next week.  No evidence of myasthenia gravis.  Notes indicated continue prednisone 10mg  and IVIG.  I asked her to call Dr. Robbie Lis office for further recommendations regarding medication management as much of this may change based on the results of her biopsy. OK to stop mestinon and cancel [place] appointment. All questions were answered.    Marilyn K. Posey Pronto, DO

## 2017-09-28 ENCOUNTER — Other Ambulatory Visit (HOSPITAL_COMMUNITY): Payer: Self-pay | Admitting: *Deleted

## 2017-09-29 ENCOUNTER — Other Ambulatory Visit: Payer: Self-pay | Admitting: *Deleted

## 2017-09-29 ENCOUNTER — Ambulatory Visit (HOSPITAL_COMMUNITY)
Admission: RE | Admit: 2017-09-29 | Discharge: 2017-09-29 | Disposition: A | Discharge status: Home / Self Care | Payer: Medicare Other | Source: Ambulatory Visit | Attending: Neurology | Admitting: Neurology

## 2017-09-29 ENCOUNTER — Other Ambulatory Visit: Payer: Self-pay | Admitting: Internal Medicine

## 2017-09-29 DIAGNOSIS — G7000 Myasthenia gravis without (acute) exacerbation: Secondary | ICD-10-CM | POA: Diagnosis not present

## 2017-09-29 DIAGNOSIS — I10 Essential (primary) hypertension: Secondary | ICD-10-CM

## 2017-09-29 MED ORDER — DEXTROSE 5 % IV SOLN
INTRAVENOUS | Status: DC
Start: 2017-09-29 — End: 2017-09-30
  Administered 2017-09-29: 10:00:00 via INTRAVENOUS

## 2017-09-29 MED ORDER — IMMUNE GLOBULIN (HUMAN) 20 GM/200ML IV SOLN
400.0000 mg/kg | INTRAVENOUS | Status: DC
  Filled 2017-09-29: qty 100

## 2017-09-29 MED ORDER — LOSARTAN POTASSIUM 100 MG PO TABS: 100.0000 mg | ORAL_TABLET | Freq: Every day | ORAL | 1 refills | Status: DC

## 2017-09-29 MED ORDER — IMMUNE GLOBULIN (HUMAN) 20 GM/200ML IV SOLN
30.0000 g | INTRAVENOUS | Status: DC
  Administered 2017-09-29: 30 g via INTRAVENOUS
  Filled 2017-09-29: qty 100

## 2017-10-07 ENCOUNTER — Other Ambulatory Visit: Payer: Medicare Other

## 2017-10-07 LAB — BASIC METABOLIC PANEL
BUN: 24 mg/dL (ref 7–25)
CO2: 29 mmol/L (ref 20–32)
Calcium: 9.1 mg/dL (ref 8.6–10.4)
Chloride: 103 mmol/L (ref 98–110)
Creat: 0.87 mg/dL (ref 0.60–0.93)
Glucose, Bld: 77 mg/dL (ref 65–99)
Potassium: 4.1 mmol/L (ref 3.5–5.3)
Sodium: 139 mmol/L (ref 135–146)

## 2017-10-07 LAB — HEPATIC FUNCTION PANEL
AG Ratio: 1.5 (calc) (ref 1.0–2.5)
ALT: 17 U/L (ref 6–29)
AST: 24 U/L (ref 10–35)
Albumin: 4 g/dL (ref 3.6–5.1)
Alkaline phosphatase (APISO): 64 U/L (ref 33–130)
Bilirubin, Direct: 0.1 mg/dL (ref 0.0–0.2)
Globulin: 2.7 g/dL (calc) (ref 1.9–3.7)
Indirect Bilirubin: 0.3 mg/dL (calc) (ref 0.2–1.2)
Total Bilirubin: 0.4 mg/dL (ref 0.2–1.2)
Total Protein: 6.7 g/dL (ref 6.1–8.1)

## 2017-10-07 LAB — CBC WITH DIFFERENTIAL/PLATELET
Basophils Absolute: 50 cells/uL (ref 0–200)
Basophils Relative: 0.8 %
Eosinophils Absolute: 50 cells/uL (ref 15–500)
Eosinophils Relative: 0.8 %
HCT: 35.1 % (ref 35.0–45.0)
Hemoglobin: 11.4 g/dL — ABNORMAL LOW (ref 11.7–15.5)
Lymphs Abs: 2104 cells/uL (ref 850–3900)
MCH: 28.5 pg (ref 27.0–33.0)
MCHC: 32.5 g/dL (ref 32.0–36.0)
MCV: 87.8 fL (ref 80.0–100.0)
MPV: 10.3 fL (ref 7.5–12.5)
Monocytes Relative: 9.2 %
Neutro Abs: 3515 cells/uL (ref 1500–7800)
Neutrophils Relative %: 55.8 %
Platelets: 410 10*3/uL — ABNORMAL HIGH (ref 140–400)
RBC: 4 10*6/uL (ref 3.80–5.10)
RDW: 13.7 % (ref 11.0–15.0)
Total Lymphocyte: 33.4 %
WBC mixed population: 580 cells/uL (ref 200–950)
WBC: 6.3 10*3/uL (ref 3.8–10.8)

## 2017-10-07 LAB — LIPID PANEL
Cholesterol: 196 mg/dL (ref <200)
HDL: 57 mg/dL (ref >50)
LDL Cholesterol (Calc): 120 mg/dL (calc) — ABNORMAL HIGH
Non-HDL Cholesterol (Calc): 139 mg/dL (calc) — ABNORMAL HIGH (ref <130)
Total CHOL/HDL Ratio: 3.4 (calc) (ref <5.0)
Triglycerides: 87 mg/dL (ref <150)

## 2017-10-11 ENCOUNTER — Ambulatory Visit: Payer: Medicare Other | Admitting: Internal Medicine

## 2017-10-11 ENCOUNTER — Ambulatory Visit (INDEPENDENT_AMBULATORY_CARE_PROVIDER_SITE_OTHER): Payer: Medicare Other

## 2017-10-11 ENCOUNTER — Encounter: Payer: Self-pay | Admitting: Internal Medicine

## 2017-10-11 VITALS — BP 138/70 | HR 58 | Temp 98.0°F | Ht 65.0 in | Wt 158.0 lb

## 2017-10-11 DIAGNOSIS — D729 Disorder of white blood cells, unspecified: Secondary | ICD-10-CM

## 2017-10-11 DIAGNOSIS — I10 Essential (primary) hypertension: Secondary | ICD-10-CM | POA: Diagnosis not present

## 2017-10-11 DIAGNOSIS — D649 Anemia, unspecified: Secondary | ICD-10-CM

## 2017-10-11 DIAGNOSIS — Z0000 Encounter for general adult medical examination without abnormal findings: Secondary | ICD-10-CM

## 2017-10-11 DIAGNOSIS — G609 Hereditary and idiopathic neuropathy, unspecified: Secondary | ICD-10-CM

## 2017-10-11 DIAGNOSIS — N6324 Unspecified lump in the left breast, lower inner quadrant: Secondary | ICD-10-CM

## 2017-10-11 DIAGNOSIS — R011 Cardiac murmur, unspecified: Secondary | ICD-10-CM

## 2017-10-11 DIAGNOSIS — R29898 Other symptoms and signs involving the musculoskeletal system: Secondary | ICD-10-CM

## 2017-10-11 MED ORDER — NEBIVOLOL HCL 5 MG PO TABS
5.0000 mg | ORAL_TABLET | Freq: Every day | ORAL | 3 refills | Status: DC
Start: 2017-10-11 — End: 2018-08-12

## 2017-10-11 NOTE — Patient Instructions (Addendum)
Please get your mammogram at [facility] due to the areas I could feel in your left breast.    Also, please get your stool study completed and bring it back to the lab.    Try vicks on your great toenails.  Continue your bystolic.  Try to see if it's cheaper at [facility].

## 2017-10-11 NOTE — Progress Notes (Signed)
Subjective:   Marilyn [name] is a 77 y.o. female who presents for Medicare Annual (Subsequent) preventive examination.  Last AWV-09/28/2016  Objective:     Vitals: BP 138/70 (BP Location: Left Arm, Patient Position: Sitting)   Pulse (!) 58   Temp 98 F (36.7 C) (Oral)   Ht 5' 5"  (1.651 m)   Wt 158 lb (71.7 kg)   SpO2 96%   BMI 26.29 kg/m   Body mass index is 26.29 kg/m.  Advanced Directives 10/11/2017 07/20/2017 07/19/2017 05/15/2017 04/12/2017 04/12/2017 10/07/2016  Does Patient Have a Medical Advance Directive? Yes - No No Yes Yes No  Type of Advance Directive Living will;Healthcare Power of [place] -  Does patient want to make changes to medical advance directive? No - Patient declined - - - No - Patient declined - -  Copy of [place] in Chart? No - copy requested - - - - No - copy requested -  Would patient like information on creating a medical advance directive? - No - Patient declined - - - - -    Tobacco Social History   Tobacco Use  Smoking Status Never Smoker  Smokeless Tobacco Never Used     Counseling given: Not Answered   Clinical Intake:  Pre-visit preparation completed: No  Pain : No/denies pain     Nutritional Risks: None Diabetes: No  How often do you need to have someone help you when you read instructions, pamphlets, or other written materials from your doctor or pharmacy?: 1 - Never What is the last grade level you completed in school?: College  Interpreter Needed?: No  Information entered by :: Theodoro Doing, RN  Past Medical History:  Diagnosis Date  . Abnormality of gait   . Alopecia, unspecified   . Altered mental status   . Breast cancer ([place])    stage 1; right  . Cataracts, bilateral   . Decreased rectal sphincter tone 07/01/12  . Diverticulosis   . Enterocele   . Esophageal stricture 07/01/12  . Hernia   . Hiatal hernia   . Hypertension   .  Irritable bowel syndrome   . Lumbago   . Migraine with aura, without mention of intractable migraine without mention of status migrainosus   . Mixed hearing loss, bilateral   . 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 Osteoarthrosis, unspecified whether generalized or localized, unspecified site   . Other and unspecified hyperlipidemia   . Plantar fascial fibromatosis   . Rectal prolapse   . Reflux esophagitis   . Regional enteritis of small intestine ([place])   . Senile osteoporosis   . Spasm of muscle   . Unspecified constipation   . Unspecified hypothyroidism    Past Surgical History:  Procedure Laterality Date  . APPENDECTOMY    . BASAL CELL CARCINOMA EXCISION  1978   neck  . BREAST LUMPECTOMY Right    snbx, apbi  . Cataract removal OD  06/28/2012   . Cataract removal OS  12/13/2012  . dermatolfibroma  2012  . DILATION AND CURETTAGE OF UTERUS    . ENTEROCELE REPAIR    . HAIR TRANSPLANT  2008  . HERNIA REPAIR    . IR FLUORO GUIDE CV LINE RIGHT  07/19/2017  . IR PATIENT EVAL TECH 0-60 MINS  07/29/2017  . IR US GUIDE VASC ACCESS RIGHT  07/19/2017  . OOPHORECTOMY    . strabisumus eye surgery  x 2  . TONSILLECTOMY     Family History  Problem Relation Age of Onset  . Hypertension Mother   . Stroke Mother   . Alzheimer's disease Father   . Hypertension Brother   . Breast cancer Maternal Aunt   . Gallbladder disease Brother   . Colon cancer Neg Hx    Social History   Socioeconomic History  . Marital status: Single    Spouse name: Not on file  . Number of children: 0  . Years of education: 31  . Highest education level: Not on file  Occupational History  . Occupation: retired Economist  Social Needs  . Financial resource strain: Not hard at all  . Food insecurity:    Worry: Never true    Inability: Never true  . Transportation needs:    Medical: No    Non-medical: No  Tobacco Use  . Smoking status: Never Smoker  . Smokeless tobacco:  Never Used  Substance and Sexual Activity  . Alcohol use: No  . Drug use: No  . Sexual activity: Never  Lifestyle  . Physical activity:    Days per week: 0 days    Minutes per session: 0 min  . Stress: Only a little  Relationships  . Social connections:    Talks on phone: Twice a week    Gets together: Once a week    Attends religious service: Never    Active member of club or organization: Yes    Attends meetings of clubs or organizations: More than 4 times per year    Relationship status: Never married  Other Topics Concern  . Not on file  Social History Narrative   Lives alone in a 2 story home.  Has 2 dogs, no children.     Retired Economist.     Education: Oceanographer.      Outpatient Encounter Medications as of 10/11/2017  Medication Sig  . acetaminophen (TYLENOL) 500 MG tablet Take 500 mg by mouth every 6 (six) hours as needed for mild pain.   [name] CALCIUM-VITAMIN D PO Take 1 capsule by mouth daily.  . diphenhydrAMINE (BENADRYL) 25 MG tablet Take 12.5-25 mg by mouth at bedtime as needed for sleep.   . flutamide (EULEXIN) 125 MG capsule Take 125 mg by mouth two times a day  . hyoscyamine (LEVSIN SL) 0.125 MG SL tablet DISSOLVE 1 TABLET UNDER TONGUE EVERY 4-6 HOURS AS NEEDED FOR DIARRHEA  . losartan (COZAAR) 100 MG tablet Take 1 tablet (100 mg total) by mouth daily.  . minoxidil (LONITEN) 2.5 MG tablet 1.66m by mouth daily  . Multiple Vitamins-Minerals (MULTIVITAMIN & MINERAL PO) Take 1 tablet by mouth daily.  . nebivolol (BYSTOLIC) 5 MG tablet Take 1 tablet (5 mg total) by mouth daily.  .Marland KitchenNeomycin-Bacitracin-Polymyxin (HCA TRIPLE ANTIBIOTIC OINTMENT EX) Apply 1 application topically daily as needed (irritation to nares).  . pantoprazole (PROTONIX) 40 MG tablet TAKE 1 TABLET BY MOUTH EVERY OTHER DAY  . predniSONE (DELTASONE) 10 MG tablet Take 10 mg by mouth daily with breakfast.  . Probiotic Product (PROBIOTIC PO) Take 1 tablet by mouth daily.  . traMADol (ULTRAM)  50 MG tablet Take 1 tablet (50 mg total) daily as needed by mouth (severe neck pain).  . vitamin C (ASCORBIC ACID) 500 MG tablet Take 500 mg by mouth daily.  .Kerry DoryDextrin-Calcium ([employer]S EASY FIBER/CALCIUM PO) Take 1 tablet by mouth daily.   . [DISCONTINUED] aspirin 81 MG tablet Take 81  mg by mouth daily.  . [DISCONTINUED] mupirocin ointment (BACTROBAN) 2 % APPLY TO AREA ON RIGHT LOWER LEG DAILY UNTIL HEALED, COVER WITH BANDAID  . [DISCONTINUED] pyridostigmine (MESTINON) 60 MG tablet Take 1 tablet (60 mg total) by mouth See admin instructions. 63m by mouth twice daily - may take additional 683mat 7pm if needed for energy  . [DISCONTINUED] pyridostigmine (MESTINON) 60 MG tablet TAKE AT 9AM, 2PM, AND 7PM.DAILY   No facility-administered encounter medications on file as of 10/11/2017.     Activities of Daily Living In your present state of health, do you have any difficulty performing the following activities: 10/11/2017 07/19/2017  Hearing? N N  Vision? N N  Difficulty concentrating or making decisions? Y N  Walking or climbing stairs? N N  Dressing or bathing? N N  Doing errands, shopping? N N  Preparing Food and eating ? N -  Using the Toilet? N -  In the past six months, have you accidently leaked urine? Y -  Do you have problems with loss of bowel control? N -  Managing your Medications? N -  Managing your Finances? N -  Housekeeping or managing your Housekeeping? N -  Some recent data might be hidden    Patient Care Team: ReGayland CurryDO as PCP - General (Geriatric Medicine) McLoney LohMD (Dermatology) BrLafayette DragonMD (Inactive) as Consulting Physician (Gastroenterology) Magrinat, GuVirgie DadMD as Consulting Physician (Oncology) StShon HoughMD as Consulting Physician (Ophthalmology) TeLeta BaptistMD as Consulting Physician (Otolaryngology) WaRolm BookbinderMD as Consulting Physician (General Surgery) PeIrene ShipperMD as Consulting Physician  (Gastroenterology) WeBlima RichMD as Referring Physician (Obstetrics and Gynecology)    Assessment:   This is a routine wellness examination for [employer] Exercise Activities and Dietary recommendations Current Exercise Habits: The patient does not participate in regular exercise at present, Exercise limited by: None identified  Goals    . patient stated     Starting 09/28/16 I will find and start doing tai chi 1-2 times a week.     . Patient Stated     Patient would like to walk 3 days a week       Fall Risk Fall Risk  10/11/2017 09/14/2017 08/23/2017 08/10/2017 08/05/2017  Falls in the past year? Yes Yes No No No  Number falls in past yr: 1 1 - - -  Injury with Fall? No No - - -  Risk for fall due to : - - - - -  Follow up - - - - -   Is the patient's home free of loose throw rugs in walkways, pet beds, electrical cords, etc?   yes      Grab bars in the bathroom? no      Handrails on the stairs?   yes      Adequate lighting?   yes  Timed Get Up and Go performed: 20 seconds, fall risk  Depression Screen PHQ 2/9 Scores 10/11/2017 04/12/2017 09/28/2016 09/10/2015  PHQ - 2 Score 0 0 0 0     Cognitive Function MMSE - Mini Mental State Exam 10/11/2017 09/28/2016 09/10/2015 04/24/2014  Orientation to time 5 5 5 5   Orientation to Place 5 5 5 5   Registration 3 3 3 3   Attention/ Calculation 5 5 5 5   Recall 3 3 3 2   Language- name 2 objects 2 2 2 2   Language- repeat 1 1 1 1   Language- follow 3 step command 3 3 3  3  Language- read & follow direction 1 1 1 1   Write a sentence 1 1 1 1   Copy design 1 1 1 1   Total score 30 30 30 29         Immunization History  Administered Date(s) Administered  . DTaP 06/08/1996  . Hepatitis A 10/18/1996, 11/15/1997  . Influenza, High Dose Seasonal PF 03/13/2017  . Influenza,inj,Quad PF,6+ Mos 03/01/2013, 03/02/2014, 03/05/2015, 04/14/2016  . Influenza-Unspecified 04/07/2016  . Pneumococcal Conjugate-13 09/10/2015  . Pneumococcal-Unspecified  05/07/2010  . Td 09/04/1981, 02/11/1993, 12/07/2011  . Tdap 12/07/2011  . Yellow Fever 11/08/1987  . Zoster 11/30/2006  . Zoster Recombinat (Shingrix) 06/09/2017    Qualifies for Shingles Vaccine? Up to date-patient is calling pharmacy for date of last shot  Screening Tests Health Maintenance  Topic Date Due  . MAMMOGRAM  11/04/2016  . INFLUENZA VACCINE  01/06/2018  . TETANUS/TDAP  12/06/2021  . DEXA SCAN  Completed  . PNA vac Low Risk Adult  Completed    Cancer Screenings: Lung: Low Dose CT Chest recommended if Age 6-80 years, 30 pack-year currently smoking OR have quit w/in 15years. Patient does not qualify. Breast:  Up to date on Mammogram? Yes   Up to date of Bone Density/Dexa? Yes Colorectal: up to date  Additional Screenings:  Hepatitis C Screening: declined     Plan:    I have personally reviewed and addressed the Medicare Annual Wellness questionnaire and have noted the following in the patient's chart:  A. Medical and social history B. Use of alcohol, tobacco or illicit drugs  C. Current medications and supplements D. Functional ability and status E.  Nutritional status F.  Physical activity G. Advance directives H. List of other physicians I.  Hospitalizations, surgeries, and ER visits in previous 12 months J.  [place] to include hearing, vision, cognitive, depression L. Referrals and appointments - none  In addition, I have reviewed and discussed with patient certain preventive protocols, quality metrics, and best practice recommendations. A written personalized care plan for preventive services as well as general preventive health recommendations were provided to patient.  See attached scanned questionnaire for additional information.   Signed,   Tyson Dense, RN Nurse Health Advisor  Patient Concerns: Unable to fully empty bladder in the last 6 months and urinary frequency

## 2017-10-11 NOTE — Progress Notes (Signed)
Provider:  Rexene Edison. Mariea Clonts, D.O., C.M.D. Location:   [facility]  Place of Service:   clinic  Previous PCP: Gayland Curry, DO Patient Care Team: Gayland Curry, DO as PCP - General (Geriatric Medicine) Loney Loh, MD (Dermatology) Lafayette Dragon, MD (Inactive) as Consulting Physician (Gastroenterology) Magrinat, Virgie Dad, MD as Consulting Physician (Oncology) Shon Hough, MD as Consulting Physician (Ophthalmology) Leta Baptist, MD as Consulting Physician (Otolaryngology) Rolm Bookbinder, MD as Consulting Physician (General Surgery) Irene Shipper, MD as Consulting Physician (Gastroenterology) Blima Rich, MD as Referring Physician (Obstetrics and Gynecology)  Extended Emergency Contact Information Primary Emergency Contact: [facility] Phone: [phone] Mobile Phone: [phone] Relation: Sister  Code Status: DNR Goals of Care: Advanced Directive information Advanced Directives 10/11/2017  Does Patient Have a Medical Advance Directive? Yes  Type of Advance Directive Living will;Healthcare Power of Attorney  Does patient want to make changes to medical advance directive? No - Patient declined  Copy of [place] in Chart? No - copy requested  Would patient like information on creating a medical advance directive? -   Chief Complaint  Patient presents with  . Annual Exam    CPE    HPI: Patient is a 77 y.o. female seen today for an annual physical exam.  Due for mammogram this month officially--previously ordered by Dr. Nori Riis and done at [facility] mammography.   Reports she had never had altered mental status in her history.  I removed it.    Dropped head syndrome:   She saw Dr. Nanine Means at [facility].  Path for it is going to be done at [facility].  PT has not helped her.  Had her right arm deltoid muscle biopsy.  She is tapering down on prednisone to a minimal dose until official diagnosis made.  June 21st at 8:30am is f/u.  Has had a lot of blood samples [place]  [place] throughout the [place].    If she goes out to the mailbox and back, she has to sit b/c she's winded.  60 yards from her home.  She admits she has not gotten exercise like she'd been.  She is back to mowing on riding mower and raking leaves.  She's had more energy and less winded.  Her goal is to walk down her street and gradually increase it.    Echo was reviewed with her.  It showed only grade 1 diastolic dysfunction.  EF was normal, vigorous in fact.  She had mild aortic and mitral calcification and regurgitation.    Anemia:  Has not done her stool sample--fecal globin.  WBC is now normal.  Platelets remain elevated.    Past Medical History:  Diagnosis Date  . Abnormality of gait   . Alopecia, unspecified   . Altered mental status   . Breast cancer ([place])    stage 1; right  . Cataracts, bilateral   . Decreased rectal sphincter tone 07/01/12  . Diverticulosis   . Enterocele   . Esophageal stricture 07/01/12  . Hernia   . Hiatal hernia   . Hypertension   . Irritable bowel syndrome   . Lumbago   . Migraine with aura, without mention of intractable migraine without mention of status migrainosus   . Mixed hearing loss, bilateral   . 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 Osteoarthrosis, unspecified whether generalized or localized, unspecified site   . Other and unspecified hyperlipidemia   . Plantar fascial fibromatosis   . Rectal prolapse   . Reflux esophagitis   .  Regional enteritis of small intestine ([place])   . Senile osteoporosis   . Spasm of muscle   . Unspecified constipation   . Unspecified hypothyroidism    Past Surgical History:  Procedure Laterality Date  . APPENDECTOMY    . BASAL CELL CARCINOMA EXCISION  1978   neck  . BREAST LUMPECTOMY Right    snbx, apbi  . Cataract removal OD  06/28/2012   . Cataract removal OS  12/13/2012  . dermatolfibroma  2012  . DILATION AND CURETTAGE OF UTERUS    . ENTEROCELE REPAIR    . HAIR TRANSPLANT  2008  .  HERNIA REPAIR    . IR FLUORO GUIDE CV LINE RIGHT  07/19/2017  . IR PATIENT EVAL TECH 0-60 MINS  07/29/2017  . IR US GUIDE VASC ACCESS RIGHT  07/19/2017  . OOPHORECTOMY    . strabisumus eye surgery     x 2  . TONSILLECTOMY      reports that she has never smoked. She has never used smokeless tobacco. She reports that she does not drink alcohol or use drugs.  Functional Status Survey:    Family History  Problem Relation Age of Onset  . Hypertension Mother   . Stroke Mother   . Alzheimer's disease Father   . Hypertension Brother   . Breast cancer Maternal Aunt   . Gallbladder disease Brother   . Colon cancer Neg Hx     Health Maintenance  Topic Date Due  . MAMMOGRAM  11/04/2016  . INFLUENZA VACCINE  01/06/2018  . TETANUS/TDAP  12/06/2021  . DEXA SCAN  Completed  . PNA vac Low Risk Adult  Completed    Allergies  Allergen Reactions  . Gabapentin Other (See Comments)    Balance disturbance  . Cetacaine [Butamben-Tetracaine-Benzocaine] Other (See Comments)    LOST HER SENSE OF TASTE FOR OVER A MONTH!!!    Outpatient Encounter Medications as of 10/11/2017  Medication Sig  . acetaminophen (TYLENOL) 500 MG tablet Take 500 mg by mouth every 6 (six) hours as needed for mild pain.   [name] CALCIUM-VITAMIN D PO Take 1 capsule by mouth daily.  . diphenhydrAMINE (BENADRYL) 25 MG tablet Take 12.5-25 mg by mouth at bedtime as needed for sleep.   . flutamide (EULEXIN) 125 MG capsule Take 125 mg by mouth two times a day  . hyoscyamine (LEVSIN SL) 0.125 MG SL tablet DISSOLVE 1 TABLET UNDER TONGUE EVERY 4-6 HOURS AS NEEDED FOR DIARRHEA  . losartan (COZAAR) 100 MG tablet Take 1 tablet (100 mg total) by mouth daily.  . minoxidil (LONITEN) 2.5 MG tablet 1.25mg  by mouth daily  . Multiple Vitamins-Minerals (MULTIVITAMIN & MINERAL PO) Take 1 tablet by mouth daily.  . nebivolol (BYSTOLIC) 5 MG tablet Take 1 tablet (5 mg total) by mouth daily.  [name] Neomycin-Bacitracin-Polymyxin (HCA TRIPLE ANTIBIOTIC  OINTMENT EX) Apply 1 application topically daily as needed (irritation to nares).  . pantoprazole (PROTONIX) 40 MG tablet TAKE 1 TABLET BY MOUTH EVERY OTHER DAY  . predniSONE (DELTASONE) 10 MG tablet Take 10 mg by mouth daily with breakfast.  . Probiotic Product (PROBIOTIC PO) Take 1 tablet by mouth daily.  . traMADol (ULTRAM) 50 MG tablet Take 1 tablet (50 mg total) daily as needed by mouth (severe neck pain).  . vitamin C (ASCORBIC ACID) 500 MG tablet Take 500 mg by mouth daily.  [name] Dextrin-Calcium ([employer] EASY FIBER/CALCIUM PO) Take 1 tablet by mouth daily.    No facility-administered encounter medications on file as  of 10/11/2017.     Review of Systems  Constitutional: Negative for chills, diaphoresis, fever, malaise/fatigue and weight loss.  HENT: Negative for congestion and hearing loss.        Left nostril lesion off septum growing  Eyes: Positive for double vision. Negative for blurred vision.       When turns right and nystagmus  Respiratory: Negative for cough and shortness of breath.   Cardiovascular: Negative for chest pain, palpitations and leg swelling.  Gastrointestinal: Negative for abdominal pain, blood in stool, constipation, diarrhea and melena.       Flatus  Genitourinary: Negative for dysuria.  Musculoskeletal: Negative for falls and myalgias.  Skin: Negative for itching and rash.  Neurological: Positive for sensory change and weakness. Negative for dizziness.       Of toes bilaterally  Endo/Heme/Allergies: Bruises/bleeds easily.  Psychiatric/Behavioral: Negative for depression and memory loss. The patient is not nervous/anxious and does not have insomnia.     Vitals:   10/11/17 1341  BP: 138/70  Pulse: (!) 58  Temp: 98 F (36.7 C)  TempSrc: Oral  SpO2: 96%  Weight: 158 lb (71.7 kg)  Height: 5\' 5"  (1.651 m)   Body mass index is 26.29 kg/m. Physical Exam  Constitutional: She is oriented to person, place, and time. She appears well-developed and  well-nourished. No distress.  HENT:  Head: Normocephalic and atraumatic.  Right Ear: External ear normal.  Left Ear: External ear normal.  Nose: Nose normal.  Mouth/Throat: Oropharynx is clear and moist. No oropharyngeal exudate.  Eyes: Pupils are equal, round, and reactive to light. Conjunctivae and EOM are normal.  glasses  Neck: Neck supple. No JVD present. No tracheal deviation present. No thyromegaly present.  Cardiovascular: Normal rate, regular rhythm, normal heart sounds and intact distal pulses.  Pulmonary/Chest: Effort normal and breath sounds normal. No respiratory distress.  Abdominal: Soft. Bowel sounds are normal. She exhibits no distension. There is no tenderness.  Musculoskeletal: She exhibits no tenderness.  Dropped head, but holding up and increased lumbar lordosis posture now  Lymphadenopathy:    She has no cervical adenopathy.  Neurological: She is alert and oriented to person, place, and time. No cranial nerve deficit.  Skin: Skin is warm and dry. Capillary refill takes less than 2 seconds. There is pallor.  Psychiatric: She has a normal mood and affect. Her behavior is normal. Judgment and thought content normal.    Labs reviewed: Basic Metabolic Panel: Recent Labs    07/19/17 1150  08/10/17 1445 09/14/17 1359 10/07/17 0818  NA 135   < > 139 138 139  K 3.7   < > 4.3 4.5 4.1  CL 100*   < > 103 103 103  CO2 21*   < > 25 29 29   GLUCOSE 98   < > 125 92 77  BUN 19   < > 24 24 24   CREATININE 0.92   < > 0.95* 1.01* 0.87  CALCIUM 9.2   < > 9.7 9.7 9.1  MG 2.1  --   --   --   --   PHOS 3.2  --   --   --   --    < > = values in this interval not displayed.   Liver Function Tests: Recent Labs    07/24/17 1737 07/26/17 1134 07/28/17 1440 10/07/17 0818  AST 51* 42* 31 24  ALT 38 30 22 17   ALKPHOS 34* 36* 37*  --   BILITOT 0.7 0.8 0.7 0.4  PROT 5.4* 5.9* 5.8* 6.7  ALBUMIN 3.7 4.4 4.1  --    No results for input(s): LIPASE, AMYLASE in the last 8760  hours. No results for input(s): AMMONIA in the last 8760 hours. CBC: Recent Labs    08/10/17 1445 09/03/17 1140 09/14/17 1359 10/07/17 0818  WBC 14.6* 11.4* 12.9* 6.3  NEUTROABS 13,739*  --  10,655* 3,515  HGB 12.2 11.2* 11.6* 11.4*  HCT 36.3 33.9* 34.6* 35.1  MCV 91.7 89.7 88.7 87.8  PLT 497* 454* 529* 410*   Cardiac Enzymes: Recent Labs    05/21/17 1522 07/06/17 1431  CKTOTAL 198* 120   BNP: Invalid input(s): POCBNP Lab Results  Component Value Date   HGBA1C 5.7 07/06/2017   Lab Results  Component Value Date   TSH 0.556 07/19/2017   Reviewed information from [place] and local neurology Reviewed EKG which was NSR with HR 60 today Reviewed echo done for aortic murmur.  Assessment/Plan 1. Essential hypertension - BP satisfactory today less than 140/90 -she does not want to take more medication - EKG 12-Lead done today as above - nebivolol (BYSTOLIC) 5 MG tablet; Take 1 tablet (5 mg total) by mouth daily.  Dispense: 90 tablet; Refill: 3--try sending to [facility] since rep said that it might be cheaper there  2. Mass of lower inner quadrant of left breast - also right upper outer quadrant (but she says this has been there and benign etiology in the past) - MS DIGITAL DIAGNOSTIC BILATERAL; Future to [facility]   3. Anemia, unspecified type -ongoing, not improved -still needs to do FOBT at home and bring back to our lab  4. Murmur, cardiac -in aortic area, echo with mild calcification and mild regurg only and grade 1 diastolic dysfunction, educated on this and given copy of echo  5. Dropped head syndrome -ongoing, felt to be muscular not neurologic etiology -will await results of testing at [facility] ordered by Dr. Nanine Means  6. Annual physical exam -performed today  7. Abnormal WBC count -resolved as she has been reducing prednisone  8.  Idiopathic polyneuropathy of feet -foot exam indicated this today and she reported this -counseld on avoiding walking around in bare  feet to protect herself -could be related to her unusual muscular condiiton?  Labs/tests ordered:   Orders Placed This Encounter  Procedures  . MS DIGITAL DIAGNOSTIC BILATERAL    3D    Standing Status:   Future    Standing Expiration Date:   01/12/2019    Order Specific Question:   Reason for Exam (SYMPTOM  OR DIAGNOSIS REQUIRED)    Answer:   right upper outer quadrant mass, left lower inner quadrant mass    Order Specific Question:   Preferred imaging location?    Answer:   External    Comments:   [facility]  . EKG 12-Lead   12/16/2017 f/u after appt at [place]. Detria Cummings, D.O. [facility] [address], [place] [address] Cell Phone (Mon-Fri 8am-5pm):  [phone] On Call:  [phone] & follow prompts after 5pm & weekends Office Phone:  [phone] Office Fax:  [phone]

## 2017-10-11 NOTE — Patient Instructions (Addendum)
Marilyn [name] , Thank you for taking time to come for your Medicare Wellness Visit. I appreciate your ongoing commitment to your health goals. Please review the following plan we discussed and let me know if I can assist you in the future.   Screening recommendations/referrals: Colonoscopy excluded, you are over age 78 Mammogram up to date, due 11/05/2017 Bone Density up to date, due 06/25/2018 Recommended yearly ophthalmology/optometry visit for glaucoma screening and checkup Recommended yearly dental visit for hygiene and checkup  Vaccinations: Influenza vaccine up to date, due 2019 fall season Pneumococcal vaccine up to date, completed Tdap vaccine up to date, due 12/06/2021 Shingles vaccine-please check with pharmacy when you got your shot(s)    Advanced directives: Advance directive discussed with you today. I have provided a copy for you to complete at home and have notarized. Once this is complete please bring a copy in to our office so we can scan it into your chart.  Conditions/risks identified: none  Next appointment: Tyson Dense, RN 10/14/2018 @ 12:45pm   Preventive Care 65 Years and Older, Female Preventive care refers to lifestyle choices and visits with your health care provider that can promote health and wellness. What does preventive care include?  A yearly physical exam. This is also called an annual well check.  Dental exams once or twice a year.  Routine eye exams. Ask your health care provider how often you should have your eyes checked.  Personal lifestyle choices, including:  Daily care of your teeth and gums.  Regular physical activity.  Eating a healthy diet.  Avoiding tobacco and drug use.  Limiting alcohol use.  Practicing safe sex.  Taking low-dose aspirin every day.  Taking vitamin and mineral supplements as recommended by your health care provider. What happens during an annual well check? The services and screenings done by your health care  provider during your annual well check will depend on your age, overall health, lifestyle risk factors, and family history of disease. Counseling  Your health care provider may ask you questions about your:  Alcohol use.  Tobacco use.  Drug use.  Emotional well-being.  Home and relationship well-being.  Sexual activity.  Eating habits.  History of falls.  Memory and ability to understand (cognition).  Work and work Statistician.  Reproductive health. Screening  You may have the following tests or measurements:  Height, weight, and BMI.  Blood pressure.  Lipid and cholesterol levels. These may be checked every 5 years, or more frequently if you are over 37 years old.  Skin check.  Lung cancer screening. You may have this screening every year starting at age 101 if you have a 30-pack-year history of smoking and currently smoke or have quit within the past 15 years.  Fecal occult blood test (FOBT) of the stool. You may have this test every year starting at age 72.  Flexible sigmoidoscopy or colonoscopy. You may have a sigmoidoscopy every 5 years or a colonoscopy every 10 years starting at age 52.  Hepatitis C blood test.  Hepatitis B blood test.  Sexually transmitted disease (STD) testing.  Diabetes screening. This is done by checking your blood sugar (glucose) after you have not eaten for a while (fasting). You may have this done every 1-3 years.  Bone density scan. This is done to screen for osteoporosis. You may have this done starting at age 41.  Mammogram. This may be done every 1-2 years. Talk to your health care provider about how often you should have  regular mammograms. Talk with your health care provider about your test results, treatment options, and if necessary, the need for more tests. Vaccines  Your health care provider may recommend certain vaccines, such as:  Influenza vaccine. This is recommended every year.  Tetanus, diphtheria, and acellular  pertussis (Tdap, Td) vaccine. You may need a Td booster every 10 years.  Zoster vaccine. You may need this after age 38.  Pneumococcal 13-valent conjugate (PCV13) vaccine. One dose is recommended after age 78.  Pneumococcal polysaccharide (PPSV23) vaccine. One dose is recommended after age 36. Talk to your health care provider about which screenings and vaccines you need and how often you need them. This information is not intended to replace advice given to you by your health care provider. Make sure you discuss any questions you have with your health care provider. Document Released: 06/21/2015 Document Revised: 02/12/2016 Document Reviewed: 03/26/2015 Elsevier Interactive Patient Education  2017 [place] Prevention in the Home Falls can cause injuries. They can happen to people of all ages. There are many things you can do to make your home safe and to help prevent falls. What can I do on the outside of my home?  Regularly fix the edges of walkways and driveways and fix any cracks.  Remove anything that might make you trip as you walk through a door, such as a raised step or threshold.  Trim any bushes or trees on the path to your home.  Use bright outdoor lighting.  Clear any walking paths of anything that might make someone trip, such as rocks or tools.  Regularly check to see if handrails are loose or broken. Make sure that both sides of any steps have handrails.  Any raised decks and porches should have guardrails on the edges.  Have any leaves, snow, or ice cleared regularly.  Use sand or salt on walking paths during winter.  Clean up any spills in your garage right away. This includes oil or grease spills. What can I do in the bathroom?  Use night lights.  Install grab bars by the toilet and in the tub and shower. Do not use towel bars as grab bars.  Use non-skid mats or decals in the tub or shower.  If you need to sit down in the shower, use a plastic,  non-slip stool.  Keep the floor dry. Clean up any water that spills on the floor as soon as it happens.  Remove soap buildup in the tub or shower regularly.  Attach bath mats securely with double-sided non-slip rug tape.  Do not have throw rugs and other things on the floor that can make you trip. What can I do in the bedroom?  Use night lights.  Make sure that you have a light by your bed that is easy to reach.  Do not use any sheets or blankets that are too big for your bed. They should not hang down onto the floor.  Have a firm chair that has side arms. You can use this for support while you get dressed.  Do not have throw rugs and other things on the floor that can make you trip. What can I do in the kitchen?  Clean up any spills right away.  Avoid walking on wet floors.  Keep items that you use a lot in easy-to-reach places.  If you need to reach something above you, use a strong step stool that has a grab bar.  Keep electrical cords out of the  way.  Do not use floor polish or wax that makes floors slippery. If you must use wax, use non-skid floor wax.  Do not have throw rugs and other things on the floor that can make you trip. What can I do with my stairs?  Do not leave any items on the stairs.  Make sure that there are handrails on both sides of the stairs and use them. Fix handrails that are broken or loose. Make sure that handrails are as long as the stairways.  Check any carpeting to make sure that it is firmly attached to the stairs. Fix any carpet that is loose or worn.  Avoid having throw rugs at the top or bottom of the stairs. If you do have throw rugs, attach them to the floor with carpet tape.  Make sure that you have a light switch at the top of the stairs and the bottom of the stairs. If you do not have them, ask someone to add them for you. What else can I do to help prevent falls?  Wear shoes that:  Do not have high heels.  Have rubber  bottoms.  Are comfortable and fit you well.  Are closed at the toe. Do not wear sandals.  If you use a stepladder:  Make sure that it is fully opened. Do not climb a closed stepladder.  Make sure that both sides of the stepladder are locked into place.  Ask someone to hold it for you, if possible.  Clearly mark and make sure that you can see:  Any grab bars or handrails.  First and last steps.  Where the edge of each step is.  Use tools that help you move around (mobility aids) if they are needed. These include:  Canes.  Walkers.  Scooters.  Crutches.  Turn on the lights when you go into a dark area. Replace any light bulbs as soon as they burn out.  Set up your furniture so you have a clear path. Avoid moving your furniture around.  If any of your floors are uneven, fix them.  If there are any pets around you, be aware of where they are.  Review your medicines with your doctor. Some medicines can make you feel dizzy. This can increase your chance of falling. Ask your doctor what other things that you can do to help prevent falls. This information is not intended to replace advice given to you by your health care provider. Make sure you discuss any questions you have with your health care provider. Document Released: 03/21/2009 Document Revised: 10/31/2015 Document Reviewed: 06/29/2014 Elsevier Interactive Patient Education  2017 [employer].

## 2017-10-26 ENCOUNTER — Encounter: Payer: Self-pay | Admitting: Internal Medicine

## 2017-10-26 LAB — HM DEXA SCAN

## 2017-10-26 LAB — HM MAMMOGRAPHY

## 2017-10-27 ENCOUNTER — Encounter (HOSPITAL_COMMUNITY)
Admission: RE | Admit: 2017-10-27 | Discharge: 2017-10-27 | Disposition: A | Discharge status: Home / Self Care | Payer: Medicare Other | Source: Ambulatory Visit | Attending: Neurology | Admitting: Neurology

## 2017-10-27 ENCOUNTER — Encounter: Payer: Self-pay | Admitting: *Deleted

## 2017-10-27 DIAGNOSIS — G7000 Myasthenia gravis without (acute) exacerbation: Secondary | ICD-10-CM | POA: Diagnosis present

## 2017-10-27 MED ORDER — IMMUNE GLOBULIN (HUMAN) 10 GM/100ML IV SOLN
30.0000 g | INTRAVENOUS | Status: DC
  Administered 2017-10-27: 30 g via INTRAVENOUS
  Filled 2017-10-27: qty 200

## 2017-10-27 MED ORDER — IMMUNE GLOBULIN (HUMAN) 10 GM/100ML IV SOLN
30.0000 g | INTRAVENOUS | Status: DC
  Filled 2017-10-27: qty 300

## 2017-11-03 ENCOUNTER — Encounter: Payer: Self-pay | Admitting: *Deleted

## 2017-11-05 ENCOUNTER — Telehealth: Payer: Self-pay

## 2017-11-05 NOTE — Telephone Encounter (Signed)
I called patient to inform her of Bone Density Test Results completed on 10/26/17. Patient states she was informed of results yesterday by [facility], patient states she was told results are fine.  I re-advised based on hand written instructions by Dr.Reed:   Osteopenia, overall has worsened since last bone density. Recommend prolia if insurance will cover due to prior tamoxifen and breast cancer history.   Patient verbalized understanding and would like for [place] to proceeded with [employer] verification.   Initial form completed and given to Caren Griffins to further process

## 2017-11-09 ENCOUNTER — Telehealth: Payer: Self-pay | Admitting: *Deleted

## 2017-11-09 NOTE — Telephone Encounter (Signed)
Patient called and stated that she has collected her FOBT sample but she has lost the form and label that goes with it. Patient is wanting to know if you have a copy of it. Please Advise.

## 2017-11-09 NOTE — Telephone Encounter (Signed)
Patient notified. Patient stated that she will go through her paperwork and find it. Stated that she will drop it off.

## 2017-11-09 NOTE — Telephone Encounter (Signed)
No, we give the patient all the paperwork.

## 2017-11-15 ENCOUNTER — Other Ambulatory Visit: Payer: Self-pay

## 2017-11-15 DIAGNOSIS — D649 Anemia, unspecified: Secondary | ICD-10-CM

## 2017-11-29 ENCOUNTER — Telehealth: Payer: Self-pay | Admitting: Internal Medicine

## 2017-11-29 NOTE — Telephone Encounter (Signed)
Patient has [facility] Medicare, based on her insurances benefits they will not cover the Prolia Injection. Ms. Loeffler will contact her insurance and call me back regarding the reason why.....  Also,,, Pt went to [facility] for an appointment.  She want you to know about a New Diagnosis given to her on this visit.Marland KitchenMarland KitchenInclusion Body Myositis (IBM).  No treatment for this diagnosis per pt..Carolin Coy

## 2017-11-30 NOTE — Telephone Encounter (Signed)
I'm disappointed that the prolia will not be covered.  I hope she has some luck when calling [facility] medicare.  The only treatment for the inclusion body myositis I am aware of is the prednisone that she's already been taking, but it's not a long term treatment.  I know it has flare-ups.  We can discuss this in more detail at her visit 7/11.  I will make sure I have some information for her to read about it when she comes.

## 2017-12-10 ENCOUNTER — Telehealth: Payer: Self-pay | Admitting: *Deleted

## 2017-12-10 NOTE — Telephone Encounter (Signed)
She can try OTC plain mucinex or [place] HBP

## 2017-12-10 NOTE — Telephone Encounter (Signed)
Patient notified and verbalized understanding. 

## 2017-12-10 NOTE — Telephone Encounter (Signed)
Patient called and said she is starting to develop a sore throat. No fever, headache, cough, congestion or body aches.  Any OTC meds that she can take with her medical history? She is already doing salt water gargles.

## 2017-12-16 ENCOUNTER — Ambulatory Visit: Payer: Medicare Other | Admitting: Internal Medicine

## 2017-12-16 ENCOUNTER — Encounter: Payer: Self-pay | Admitting: Internal Medicine

## 2017-12-16 VITALS — BP 136/78 | HR 68 | Temp 98.7°F | Ht 65.0 in | Wt 158.8 lb

## 2017-12-16 DIAGNOSIS — G7241 Inclusion body myositis [IBM]: Secondary | ICD-10-CM | POA: Diagnosis not present

## 2017-12-16 DIAGNOSIS — E785 Hyperlipidemia, unspecified: Secondary | ICD-10-CM

## 2017-12-16 DIAGNOSIS — D638 Anemia in other chronic diseases classified elsewhere: Secondary | ICD-10-CM | POA: Diagnosis not present

## 2017-12-16 DIAGNOSIS — M810 Age-related osteoporosis without current pathological fracture: Secondary | ICD-10-CM

## 2017-12-16 DIAGNOSIS — I10 Essential (primary) hypertension: Secondary | ICD-10-CM | POA: Diagnosis not present

## 2017-12-16 DIAGNOSIS — R29898 Other symptoms and signs involving the musculoskeletal system: Secondary | ICD-10-CM

## 2017-12-16 LAB — FECAL GLOBIN BY IMMUNOCHEMISTRY
FECAL GLOBIN RESULT:: NOT DETECTED
MICRO NUMBER:: 90817080
SPECIMEN QUALITY: ADEQUATE

## 2017-12-16 NOTE — Telephone Encounter (Signed)
Per Prolia Representative- [place].  Based on Patient's medical health benefit at this time, or that the proposed use of Prolia is not consistent with the payor's policy and therefore is not covered.  The Breast cancer diagnoisis is not covered because the cancer is not presently active. They need to know if the patient had any type of fracture? Caren Griffins

## 2017-12-16 NOTE — Patient Instructions (Addendum)
Check with Dr. Nanine Means about Prolia therapy for your osteoporosis.    Schedule your routine colonoscopy and EGD with your gastroenterologist.

## 2017-12-16 NOTE — Progress Notes (Signed)
Location:  [facility] clinic Provider:  Camil Wilhelmsen L. Mariea Clonts, D.O., C.M.D.  Code Status: DNR Goals of Care:  Advanced Directives 12/16/2017  Does Patient Have a Medical Advance Directive? No  Type of Advance Directive -  Does patient want to make changes to medical advance directive? -  Copy of [place] in Chart? -  Would patient like information on creating a medical advance directive? -   Chief Complaint  Patient presents with  . Follow-up    Pt is being seen for a 2 month follow up.   . Audit-C screening    score of 0  . ACP    needed    HPI: Patient is a 77 y.o. female seen today for medical management of chronic diseases.    Has had a cold and took coricidin bp and cough suppressant.  Has a little congestion and phlegm, but mostly better.  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  There's a booklet she ordered for this.  She showed me the educational materials that [facility] provided for her from the doctor, OT, and PT.  She is doing the exercises as recommended, using her brace, but not using a walking stick. She's started water aerobics.    BP elevated at 148/84.  101/52 at [facility].  Knows not to add salt, try to avoid stress.  She prefers to cook with microwave and oven not stove.  She's growing veggies, but usually gives them away.  She did get a casserole back from a neighbor using the veggies.    Osteoporosis:  Discussed prolia previously.  She is willing to pay the $64.    Her dogs are great companions since she's unable to do her birdwatching like she did.  Her family is in [place] so if she has to downsize, she may need to move there to a [place].    Past Medical History:  Diagnosis Date  . Alopecia, unspecified   . Breast cancer ([place])    stage 1; right  . Cataracts, bilateral   . Decreased rectal sphincter tone 07/01/12  . Diverticulosis   . Enterocele   . Esophageal stricture 07/01/12  . Hernia   . Hiatal hernia   . Hypertension    . Irritable bowel syndrome   . Lumbago   . Migraine with aura, without mention of intractable migraine without mention of status migrainosus   . Mixed hearing loss, bilateral   . 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 Osteoarthrosis, unspecified whether generalized or localized, unspecified site   . Other and unspecified hyperlipidemia   . Plantar fascial fibromatosis   . Rectal prolapse   . Reflux esophagitis   . Regional enteritis of small intestine ([place])   . Senile osteoporosis   . Spasm of muscle   . Unspecified constipation   . Unspecified hypothyroidism     Past Surgical History:  Procedure Laterality Date  . APPENDECTOMY    . BASAL CELL CARCINOMA EXCISION  1978   neck  . BREAST LUMPECTOMY Right    snbx, apbi  . Cataract removal OD  06/28/2012   . Cataract removal OS  12/13/2012  . dermatolfibroma  2012  . DILATION AND CURETTAGE OF UTERUS    . ENTEROCELE REPAIR    . HAIR TRANSPLANT  2008  . HERNIA REPAIR    . IR FLUORO GUIDE CV LINE RIGHT  07/19/2017  . IR PATIENT EVAL TECH 0-60 MINS  07/29/2017  . IR US GUIDE VASC ACCESS  RIGHT  07/19/2017  . OOPHORECTOMY    . strabisumus eye surgery     x 2  . TONSILLECTOMY      Allergies  Allergen Reactions  . Gabapentin Other (See Comments)    Balance disturbance  . Cetacaine [Butamben-Tetracaine-Benzocaine] Other (See Comments)    LOST HER SENSE OF TASTE FOR OVER A MONTH!!!    Outpatient Encounter Medications as of 12/16/2017  Medication Sig  . acetaminophen (TYLENOL) 500 MG tablet Take 500 mg by mouth every 6 (six) hours as needed for mild pain.   [name] CALCIUM-VITAMIN D PO Take 1 capsule by mouth daily.  . diphenhydrAMINE (BENADRYL) 25 MG tablet Take 12.5-25 mg by mouth at bedtime as needed for sleep.   . flutamide (EULEXIN) 125 MG capsule Take 125 mg by mouth two times a day  . hyoscyamine (LEVSIN SL) 0.125 MG SL tablet DISSOLVE 1 TABLET UNDER TONGUE EVERY 4-6 HOURS AS NEEDED FOR DIARRHEA  . losartan  (COZAAR) 100 MG tablet Take 1 tablet (100 mg total) by mouth daily.  . minoxidil (LONITEN) 2.5 MG tablet 1.25mg  by mouth daily  . Multiple Vitamins-Minerals (MULTIVITAMIN & MINERAL PO) Take 1 tablet by mouth daily.  . nebivolol (BYSTOLIC) 5 MG tablet Take 1 tablet (5 mg total) by mouth daily.  [name] Neomycin-Bacitracin-Polymyxin (HCA TRIPLE ANTIBIOTIC OINTMENT EX) Apply 1 application topically daily as needed (irritation to nares).  . pantoprazole (PROTONIX) 40 MG tablet TAKE 1 TABLET BY MOUTH EVERY OTHER DAY  . predniSONE (DELTASONE) 10 MG tablet Take 10 mg by mouth daily with breakfast.  . Probiotic Product (PROBIOTIC PO) Take 1 tablet by mouth daily.  . traMADol (ULTRAM) 50 MG tablet Take 1 tablet (50 mg total) daily as needed by mouth (severe neck pain).  . vitamin C (ASCORBIC ACID) 500 MG tablet Take 500 mg by mouth daily.  [name] Dextrin-Calcium ([facility] EASY FIBER/CALCIUM PO) Take 1 tablet by mouth daily.    No facility-administered encounter medications on file as of 12/16/2017.     Review of Systems:  Review of Systems  Constitutional: Negative for chills, fever and malaise/fatigue.  HENT: Negative for congestion and hearing loss.   Eyes: Negative for blurred vision.       Glasses  Respiratory: Negative for cough and shortness of breath.   Cardiovascular: Negative for chest pain, palpitations and leg swelling.  Gastrointestinal: Negative for abdominal pain, blood in stool, constipation and melena.  Genitourinary: Negative for dysuria.  Musculoskeletal: Negative for back pain, falls, joint pain, myalgias and neck pain.       Head drop, some difficulty feeding herself  Skin: Negative for itching and rash.  Neurological: Negative for dizziness and loss of consciousness.  Psychiatric/Behavioral: Negative for depression and memory loss. The patient does not have insomnia.     Health Maintenance  Topic Date Due  . INFLUENZA VACCINE  01/06/2018  . MAMMOGRAM  10/27/2018  . TETANUS/TDAP   12/06/2021  . DEXA SCAN  Completed  . PNA vac Low Risk Adult  Completed    Physical Exam: Vitals:   12/16/17 1456  BP: (!) 148/84  Pulse: 68  Temp: 98.7 F (37.1 C)  TempSrc: Oral  SpO2: 97%  Weight: 158 lb 12.8 oz (72 kg)  Height: 5\' 5"  (1.651 m)   Body mass index is 26.43 kg/m. Physical Exam  Constitutional: She is oriented to person, place, and time. She appears well-developed and well-nourished. No distress.  HENT:  Rhinitis, blowing her nose  Cardiovascular: Normal rate, regular rhythm  and intact distal pulses.  Murmur heard. Pulmonary/Chest: Effort normal and breath sounds normal. No respiratory distress.  Abdominal: Bowel sounds are normal.  Musculoskeletal: Normal range of motion.  Head drop with increased lumbar lordosis due to the myositis  Neurological: She is alert and oriented to person, place, and time.  Skin: Skin is warm and dry. There is pallor.  Psychiatric: She has a normal mood and affect.    Labs reviewed: Basic Metabolic Panel: Recent Labs    05/21/17 1522  07/19/17 1150 07/19/17 1152  08/10/17 1445 09/14/17 1359 10/07/17 0818  NA  --    < > 135  --    < > 139 138 139  K  --    < > 3.7  --    < > 4.3 4.5 4.1  CL  --    < > 100*  --    < > 103 103 103  CO2  --    < > 21*  --    < > 25 29 29   GLUCOSE  --    < > 98  --    < > 125 92 77  BUN  --    < > 19  --    < > 24 24 24   CREATININE  --    < > 0.92  --    < > 0.95* 1.01* 0.87  CALCIUM  --    < > 9.2  --    < > 9.7 9.7 9.1  MG  --   --  2.1  --   --   --   --   --   PHOS  --   --  3.2  --   --   --   --   --   TSH 2.57  --   --  0.556  --   --   --   --    < > = values in this interval not displayed.   Liver Function Tests: Recent Labs    07/24/17 1737 07/26/17 1134 07/28/17 1440 10/07/17 0818  AST 51* 42* 31 24  ALT 38 30 22 17   ALKPHOS 34* 36* 37*  --   BILITOT 0.7 0.8 0.7 0.4  PROT 5.4* 5.9* 5.8* 6.7  ALBUMIN 3.7 4.4 4.1  --    No results for input(s): LIPASE, AMYLASE in  the last 8760 hours. No results for input(s): AMMONIA in the last 8760 hours. CBC: Recent Labs    08/10/17 1445 09/03/17 1140 09/14/17 1359 10/07/17 0818  WBC 14.6* 11.4* 12.9* 6.3  NEUTROABS 13,739*  --  10,655* 3,515  HGB 12.2 11.2* 11.6* 11.4*  HCT 36.3 33.9* 34.6* 35.1  MCV 91.7 89.7 88.7 87.8  PLT 497* 454* 529* 410*   Lipid Panel: Recent Labs    10/07/17 0818  CHOL 196  HDL 57  LDLCALC 120*  TRIG 87  CHOLHDL 3.4   Lab Results  Component Value Date   HGBA1C 5.7 07/06/2017   Assessment/Plan 1. Inclusion body myositis ([place]) -is being tapered off her prednisone and getting PT, OT, doing water aerobics and exercises and will continue to work on all of these to maintain her strength  2. 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 Senile osteoporosis -cont calcium with D and weightbearing exercise, begin prolia as long as Dr. Nanine Means is in agreement that this is safe for her -calcium levels have been normal -when pt checked with insurance, she was told she only owed $47  when previously we were told it was not covered due to osteopenia and h/o breast cancer s/p xrt and tamoxifen, but also is on steroids at present  4. Anemia in other chronic diseases classified elsewhere -very mild -hemoccult test was negative -has previously had esophagitis   - CBC with Differential/Platelet; Future - COMPLETE METABOLIC PANEL WITH GFR; Future  5. Essential hypertension -bp recheck was less than 140/90, suspect increase due to her prednisone anyway so will reassess at next visit - COMPLETE METABOLIC PANEL WITH GFR; Future  6. Hyperlipidemia, unspecified hyperlipidemia type - recheck labs before next visit, not on meds - Lipid panel; Future   Labs/tests ordered:   Orders Placed This Encounter  Procedures  . CBC with Differential/Platelet    Standing Status:   Future    Standing Expiration Date:   08/17/2018  . COMPLETE METABOLIC PANEL WITH GFR    Standing Status:   Future     Standing Expiration Date:   08/17/2018  . Lipid panel    Standing Status:   Future    Standing Expiration Date:   08/17/2018   Next appt:  04/21/2018 med mgt, fasting labs before  Maryiah Olvey L. Ohana Birdwell, D.O. [facility] [address], [place] [address] Cell Phone (Mon-Fri 8am-5pm):  [phone] On Call:  [phone] & follow prompts after 5pm & weekends Office Phone:  [phone] Office Fax:  [phone]

## 2017-12-22 ENCOUNTER — Other Ambulatory Visit: Payer: Self-pay

## 2017-12-22 NOTE — Progress Notes (Signed)
error 

## 2017-12-23 ENCOUNTER — Telehealth: Payer: Self-pay | Admitting: *Deleted

## 2017-12-23 MED ORDER — AZITHROMYCIN 250 MG PO TABS: ORAL_TABLET | ORAL | 0 refills | Status: DC

## 2017-12-23 NOTE — Telephone Encounter (Signed)
Patient called and left message on Clinical intake asking if someone has called her insurance company to get her Prolia approved. Stated that she spoke with the insurance company today and they told her that they were waiting on our office to send them additional information. They told her that they would approve it once they received the information.

## 2017-12-23 NOTE — Telephone Encounter (Signed)
Patient called and stated that she still has URI symptoms.  Patient called complaining of cold/upper respiratory/sinus symptoms  1. Fever? No 2. Chills? No 3. Myalgias? some 4. How long have you had your symptoms? Since around 7/5 5. Are you coughing up mucus and if yes any discoloration? Yes, clear. Runny nose 6. What have you done for your symptoms thus far? Saline, Coricidan, Mucinex 7. Have you been around anyone sick? Going to exercising classes  I will forward your response to your provider and call you with further instructions

## 2017-12-23 NOTE — Telephone Encounter (Signed)
Let's send her in zithromax--2 tabs today, then 1 tab daily x 4 days #6   She may continue the other conservative measures she's been doing.  Be sure to drink plenty of water and rest.

## 2017-12-23 NOTE — Telephone Encounter (Signed)
Patient notified. Faxed Rx to pharmacy.

## 2017-12-31 ENCOUNTER — Other Ambulatory Visit: Payer: Self-pay

## 2017-12-31 MED ORDER — DENOSUMAB 60 MG/ML ~~LOC~~ SOSY: 60.0000 mg | PREFILLED_SYRINGE | SUBCUTANEOUS | 0 refills | Status: DC

## 2017-12-31 NOTE — Telephone Encounter (Signed)
Patient called and said that she had just gotten off the phone with [facility] and she was told that all she needed to get prolia was a prescription. Her insurance company told her that prolia would be ran against her prescription plan and she did not need any type of approval. Patient requested that a prescription for prolia be sent to her pharmacy.   Rx was sent electronically.

## 2018-01-03 NOTE — Telephone Encounter (Signed)
A fax was received from [place] stating the following:  "Dear Doctor,   Thank you for the referral. We appreciate the opportunity to serve you and your patient's. [facility] would like to acknowledge that we have received the enrollment for Marilyn [name] for the therapy PROLIA. We will contact your office within the next 48 business hours to provide a statue of our benefit verification."  I have spoke with patient and she verbalized understanding. She would like to be notified of decision once we get verification.

## 2018-01-03 NOTE — Telephone Encounter (Signed)
[facility]  [place] [address]  Phone: [phone]  Fax: [phone]

## 2018-01-07 ENCOUNTER — Telehealth: Payer: Self-pay | Admitting: *Deleted

## 2018-01-07 NOTE — Telephone Encounter (Signed)
Patient called and stated that she just wanted to let you know that while she was at Dr. McDiarmid's office her BP was 148/61. Stated no other symptoms and she has been taking her medications as directed.

## 2018-01-11 ENCOUNTER — Other Ambulatory Visit: Payer: Self-pay | Admitting: Internal Medicine

## 2018-01-11 DIAGNOSIS — I10 Essential (primary) hypertension: Secondary | ICD-10-CM

## 2018-01-11 NOTE — Telephone Encounter (Signed)
I called [facility] at [phone] to inquire about the status of prolia. I was informed that patient has been approved with a $120 co-pay and all patient needs to do is to call them at the above number to set up payment and order injection.   I left a message for patient to call the office to discuss this information.    Patient will have a $25 administration charge to have injection given in the office.

## 2018-01-11 NOTE — Telephone Encounter (Signed)
[facility]  [place] [address]  Phone: [phone]  Fax: [phone]    Me      8:41 AM  Note    A fax was received from [facility] stating the following:  "Dear Doctor,   Thank you for the referral. We appreciate the opportunity to serve you and your patient's. [facility] would like to acknowledge that we have received the enrollment for Marilyn [name] for the therapy PROLIA. We will contact your office within the next 48 business hours to provide a statue of our benefit verification."  I have spoke with patient and she verbalized understanding. She would like to be notified of decision once we get verification.        December 31, 2017  Me      12:37 PM  Note    Patient called and said that she had just gotten off the phone with [facility] and she was told that all she needed to get prolia was a prescription. Her insurance company told her that prolia would be ran against her prescription plan and she did not need any type of approval. Patient requested that a prescription for prolia be sent to her pharmacy.   Rx was sent electronically.

## 2018-01-12 NOTE — Telephone Encounter (Signed)
I spoke with patient to let her know what information I received from [place]. She did not agree with the cost of $120 because she was told that she should only have to pay $64. She will call [facility] herself to discuss this cost with them. She will let us know what she decides to do.

## 2018-01-21 ENCOUNTER — Telehealth: Payer: Self-pay | Admitting: *Deleted

## 2018-01-21 NOTE — Telephone Encounter (Signed)
Patient called and stated that she started having ringing in her Left ear last night. She forgot to take one of her blood pressure medications yesterday afternoon and wonders if the ringing is coming from increased blood pressure.  Patient is going to have her blood pressure checked and then take her blood pressure medication and then take the BP again an hour later and let us know how she is doing.

## 2018-02-15 ENCOUNTER — Telehealth: Payer: Self-pay

## 2018-02-15 NOTE — Telephone Encounter (Signed)
Patient called requesting if her last 2 visit summaries could be emailed to her sister. I informed patient we can mail or fax. Patient requested that I mail to address below:   Marilyn [name] [address] [place], [address]   AVS mailed as requested

## 2018-02-19 ENCOUNTER — Other Ambulatory Visit: Payer: Self-pay | Admitting: Internal Medicine

## 2018-03-17 ENCOUNTER — Telehealth: Payer: Self-pay | Admitting: *Deleted

## 2018-03-17 NOTE — Telephone Encounter (Signed)
Patient called requesting a Handicap Placard. (I filled out and placed in Dr. Cyndi Lennert folder for review)  Patient is also requesting to speak with Dr. Mariea Clonts directly regarding the appropriate approach regarding Marilyn [name]. Patient stated that she is blasting and accusing her of talking about her. Patient would like for you to call her [phone]  Please Advise.

## 2018-03-17 NOTE — Telephone Encounter (Signed)
Handicapped placard completed.  I attempted to call and got a voicemail.  I'm sorry that she's being harassed.  I did not specifically indicate who shared information about Ms. Cheney with me but I guess she figured it out.

## 2018-03-18 NOTE — Telephone Encounter (Signed)
LMOM for patient to return call.  Copy of Handicap Placard sent to scanning.

## 2018-03-18 NOTE — Telephone Encounter (Signed)
Patient notified. Placed Handicap Placard up front in drawer for pick up

## 2018-04-14 ENCOUNTER — Other Ambulatory Visit: Payer: Self-pay | Admitting: Internal Medicine

## 2018-04-18 ENCOUNTER — Other Ambulatory Visit: Payer: Medicare Other

## 2018-04-18 DIAGNOSIS — D638 Anemia in other chronic diseases classified elsewhere: Secondary | ICD-10-CM

## 2018-04-18 DIAGNOSIS — E785 Hyperlipidemia, unspecified: Secondary | ICD-10-CM

## 2018-04-18 DIAGNOSIS — I10 Essential (primary) hypertension: Secondary | ICD-10-CM

## 2018-04-19 LAB — COMPLETE METABOLIC PANEL WITH GFR
AG Ratio: 1.8 (calc) (ref 1.0–2.5)
ALT: 12 U/L (ref 6–29)
AST: 16 U/L (ref 10–35)
Albumin: 4.3 g/dL (ref 3.6–5.1)
Alkaline phosphatase (APISO): 84 U/L (ref 33–130)
BUN: 18 mg/dL (ref 7–25)
CO2: 29 mmol/L (ref 20–32)
Calcium: 9.7 mg/dL (ref 8.6–10.4)
Chloride: 103 mmol/L (ref 98–110)
Creat: 0.77 mg/dL (ref 0.60–0.93)
GFR, Est African American: 86 mL/min/{1.73_m2} (ref >=60)
GFR, Est Non African American: 74 mL/min/{1.73_m2} (ref >=60)
Globulin: 2.4 g/dL (calc) (ref 1.9–3.7)
Glucose, Bld: 94 mg/dL (ref 65–99)
Potassium: 4.7 mmol/L (ref 3.5–5.3)
Sodium: 140 mmol/L (ref 135–146)
Total Bilirubin: 0.5 mg/dL (ref 0.2–1.2)
Total Protein: 6.7 g/dL (ref 6.1–8.1)

## 2018-04-19 LAB — CBC WITH DIFFERENTIAL/PLATELET
Basophils Absolute: 38 cells/uL (ref 0–200)
Basophils Relative: 0.8 %
Eosinophils Absolute: 53 cells/uL (ref 15–500)
Eosinophils Relative: 1.1 %
HCT: 40.5 % (ref 35.0–45.0)
Hemoglobin: 13.6 g/dL (ref 11.7–15.5)
Lymphs Abs: 1262 cells/uL (ref 850–3900)
MCH: 31 pg (ref 27.0–33.0)
MCHC: 33.6 g/dL (ref 32.0–36.0)
MCV: 92.3 fL (ref 80.0–100.0)
MPV: 10.6 fL (ref 7.5–12.5)
Monocytes Relative: 9.1 %
Neutro Abs: 3010 cells/uL (ref 1500–7800)
Neutrophils Relative %: 62.7 %
Platelets: 323 10*3/uL (ref 140–400)
RBC: 4.39 10*6/uL (ref 3.80–5.10)
RDW: 12.5 % (ref 11.0–15.0)
Total Lymphocyte: 26.3 %
WBC mixed population: 437 cells/uL (ref 200–950)
WBC: 4.8 10*3/uL (ref 3.8–10.8)

## 2018-04-19 LAB — LIPID PANEL
Cholesterol: 187 mg/dL (ref <200)
HDL: 46 mg/dL — ABNORMAL LOW (ref >50)
LDL Cholesterol (Calc): 122 mg/dL (calc) — ABNORMAL HIGH
Non-HDL Cholesterol (Calc): 141 mg/dL (calc) — ABNORMAL HIGH (ref <130)
Total CHOL/HDL Ratio: 4.1 (calc) (ref <5.0)
Triglycerides: 91 mg/dL (ref <150)

## 2018-04-21 ENCOUNTER — Encounter: Payer: Self-pay | Admitting: Internal Medicine

## 2018-04-21 ENCOUNTER — Ambulatory Visit: Payer: Medicare Other | Admitting: Internal Medicine

## 2018-04-21 VITALS — BP 108/62 | HR 62 | Temp 97.8°F | Ht 64.0 in | Wt 155.8 lb

## 2018-04-21 DIAGNOSIS — E785 Hyperlipidemia, unspecified: Secondary | ICD-10-CM

## 2018-04-21 DIAGNOSIS — M8589 Other specified disorders of bone density and structure, multiple sites: Secondary | ICD-10-CM | POA: Diagnosis not present

## 2018-04-21 DIAGNOSIS — L658 Other specified nonscarring hair loss: Secondary | ICD-10-CM

## 2018-04-21 DIAGNOSIS — I10 Essential (primary) hypertension: Secondary | ICD-10-CM

## 2018-04-21 DIAGNOSIS — R29898 Other symptoms and signs involving the musculoskeletal system: Secondary | ICD-10-CM | POA: Diagnosis not present

## 2018-04-21 DIAGNOSIS — G7241 Inclusion body myositis [IBM]: Secondary | ICD-10-CM | POA: Diagnosis not present

## 2018-04-21 MED ORDER — BLOOD PRESSURE CUFF MISC
0 refills | Status: DC
Start: 2018-04-21 — End: 2018-08-16

## 2018-04-21 NOTE — Progress Notes (Signed)
Location:  [facility] clinic Provider:  Needham Biggins L. Mariea Clonts, D.O., C.M.D.  Code Status: full code Goals of Care:  Advanced Directives 12/16/2017  Does Patient Have a Medical Advance Directive? No  Type of Advance Directive -  Does patient want to make changes to medical advance directive? -  Copy of [place] in Chart? -  Would patient like information on creating a medical advance directive? -   Chief Complaint  Patient presents with  . Medical Management of Chronic Issues    4 month follow up, lab results provided,discuss prolia injection and bruise on left toe     HPI: Patient is a 77 y.o. female seen today for medical management of chronic diseases.    2nd toe on the left got black and blue after stubbing it on the water spouts in the pool.  It still wiggles and she's got only a slight bit of discomfort  [employer]:  For a while she had some aching in her right shoulder and down the arm a little.  If she reaches over her head a reaching up to turn the light off or on, there's a discomfort.  She does not think she needs anything more than tylenol for it.  A couple of times it has woken her at night.  Has some tramadol on hand if it gets bad.  We discussed possibly topical rub or heat use.  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  Not feeling it in her legs.  Has 4 steps den to [employer].  Going to water therapy 4 times a week and doing the floor exercises they recommended.    bp on low side.  Was low at Dr. Ike Bene.  Not dizzy.  Missed her bystolic once and waited until the next day.    She does like sweets--LDL 122--discussed ideally should be under 100.    Had flu vaccine.    Asks about prolia.  [employer] doctor did approve it.  Her sister has strongly urged her to see rheumatology--Dr. Trudie Kersten Salmons.    They narrowed down to 5 senior facilities in the [place] area--has two she'd like to look at again.    Past Medical History:  Diagnosis Date  . Alopecia,  unspecified   . Breast cancer ([place])    stage 1; right  . Cataracts, bilateral   . Decreased rectal sphincter tone 07/01/12  . Diverticulosis   . Enterocele   . Esophageal stricture 07/01/12  . Hernia   . Hiatal hernia   . Hypertension   . Irritable bowel syndrome   . Lumbago   . Migraine with aura, without mention of intractable migraine without mention of status migrainosus   . Mixed hearing loss, bilateral   . 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 Osteoarthrosis, unspecified whether generalized or localized, unspecified site   . Other and unspecified hyperlipidemia   . Plantar fascial fibromatosis   . Rectal prolapse   . Reflux esophagitis   . Regional enteritis of small intestine ([place])   . Senile osteoporosis   . Spasm of muscle   . Unspecified constipation   . Unspecified hypothyroidism     Past Surgical History:  Procedure Laterality Date  . APPENDECTOMY    . BASAL CELL CARCINOMA EXCISION  1978   neck  . BREAST LUMPECTOMY Right    snbx, apbi  . Cataract removal OD  06/28/2012   . Cataract removal OS  12/13/2012  . dermatolfibroma  2012  . DILATION  AND CURETTAGE OF UTERUS    . ENTEROCELE REPAIR    . HAIR TRANSPLANT  2008  . HERNIA REPAIR    . IR FLUORO GUIDE CV LINE RIGHT  07/19/2017  . IR PATIENT EVAL TECH 0-60 MINS  07/29/2017  . IR US GUIDE VASC ACCESS RIGHT  07/19/2017  . OOPHORECTOMY    . strabisumus eye surgery     x 2  . TONSILLECTOMY      Allergies  Allergen Reactions  . Gabapentin Other (See Comments)    Balance disturbance  . Cetacaine [Butamben-Tetracaine-Benzocaine] Other (See Comments)    LOST HER SENSE OF TASTE FOR OVER A MONTH!!!    Outpatient Encounter Medications as of 04/21/2018  Medication Sig  . acetaminophen (TYLENOL) 500 MG tablet Take 500 mg by mouth every 6 (six) hours as needed for mild pain.   [name] CALCIUM-VITAMIN D PO Take 1 capsule by mouth daily.  . diphenhydrAMINE (BENADRYL) 25 MG tablet Take 12.5-25 mg by mouth at  bedtime as needed for sleep.   . flutamide (EULEXIN) 125 MG capsule Take 125 mg by mouth two times a day  . hyoscyamine (LEVSIN SL) 0.125 MG SL tablet DISSOLVE 1 TABLET UNDER TONGUE EVERY 4-6 HOURS AS NEEDED FOR DIARRHEA  . losartan (COZAAR) 100 MG tablet TAKE 1 TABLET BY MOUTH EVERY DAY  . minoxidil (LONITEN) 2.5 MG tablet 1.25mg  by mouth daily  . Multiple Vitamins-Minerals (MULTIVITAMIN & MINERAL PO) Take 1 tablet by mouth daily.  . nebivolol (BYSTOLIC) 5 MG tablet Take 1 tablet (5 mg total) by mouth daily.  . pantoprazole (PROTONIX) 40 MG tablet TAKE 1 TABLET BY MOUTH EVERY OTHER DAY  . Probiotic Product (PROBIOTIC PO) Take 1 tablet by mouth daily.  . vitamin C (ASCORBIC ACID) 500 MG tablet Take 500 mg by mouth daily.  [name] Dextrin-Calcium ([employer] EASY FIBER/CALCIUM PO) Take 1 tablet by mouth daily.   [name] denosumab (PROLIA) 60 MG/ML SOSY injection Inject 60 mg into the skin every 6 (six) months. (Patient not taking: Reported on 04/21/2018)  . Neomycin-Bacitracin-Polymyxin (HCA TRIPLE ANTIBIOTIC OINTMENT EX) Apply 1 application topically daily as needed (irritation to nares).  . traMADol (ULTRAM) 50 MG tablet Take 1 tablet (50 mg total) daily as needed by mouth (severe neck pain). (Patient not taking: Reported on 04/21/2018)  . [DISCONTINUED] azithromycin (ZITHROMAX) 250 MG tablet Take two tablets by mouth today, then take one tablet by mouth daily   No facility-administered encounter medications on file as of 04/21/2018.     Review of Systems:  Review of Systems  Constitutional: Negative for chills, fever and malaise/fatigue.  HENT: Negative for congestion.   Eyes: Negative for blurred vision.  Respiratory: Negative for cough, sputum production, shortness of breath and wheezing.   Cardiovascular: Negative for chest pain, palpitations and leg swelling.  Gastrointestinal: Negative for abdominal pain, blood in stool, constipation, diarrhea and melena.  Genitourinary: Negative for dysuria.    Musculoskeletal: Positive for myalgias. Negative for falls.       Dropped neck persists; flattened thoracic region  Skin: Negative for itching and rash.  Neurological: Negative for dizziness and loss of consciousness.  Psychiatric/Behavioral: Negative for depression and memory loss. The patient is not nervous/anxious and does not have insomnia.     Health Maintenance  Topic Date Due  . MAMMOGRAM  10/27/2018  . TETANUS/TDAP  12/06/2021  . INFLUENZA VACCINE  Completed  . DEXA SCAN  Completed  . PNA vac Low Risk Adult  Completed    Physical  Exam: Vitals:   04/21/18 1314  BP: 108/62  Pulse: 62  Temp: 97.8 F (36.6 C)  TempSrc: Oral  SpO2: 98%  Weight: 155 lb 12.8 oz (70.7 kg)  Height: 5\' 4"  (1.626 m)   Body mass index is 26.74 kg/m. Physical Exam  Constitutional: She is oriented to person, place, and time. She appears well-developed and well-nourished. No distress.  Cardiovascular: Normal rate and regular rhythm.  Murmur heard. Pulmonary/Chest: Effort normal and breath sounds normal. No respiratory distress.  Abdominal: Bowel sounds are normal.  Musculoskeletal:  Flattened thoracic spine; cervical dropped head  Neurological: She is alert and oriented to person, place, and time.  Skin: There is pallor.  Psychiatric: She has a normal mood and affect.    Labs reviewed: Basic Metabolic Panel: Recent Labs    05/21/17 1522  07/19/17 1150 07/19/17 1152  09/14/17 1359 10/07/17 0818 04/18/18 0906  NA  --    < > 135  --    < > 138 139 140  K  --    < > 3.7  --    < > 4.5 4.1 4.7  CL  --    < > 100*  --    < > 103 103 103  CO2  --    < > 21*  --    < > 29 29 29   GLUCOSE  --    < > 98  --    < > 92 77 94  BUN  --    < > 19  --    < > 24 24 18   CREATININE  --    < > 0.92  --    < > 1.01* 0.87 0.77  CALCIUM  --    < > 9.2  --    < > 9.7 9.1 9.7  MG  --   --  2.1  --   --   --   --   --   PHOS  --   --  3.2  --   --   --   --   --   TSH 2.57  --   --  0.556  --   --   --    --    < > = values in this interval not displayed.   Liver Function Tests: Recent Labs    07/24/17 1737 07/26/17 1134 07/28/17 1440 10/07/17 0818 04/18/18 0906  AST 51* 42* 31 24 16   ALT 38 30 22 17 12   ALKPHOS 34* 36* 37*  --   --   BILITOT 0.7 0.8 0.7 0.4 0.5  PROT 5.4* 5.9* 5.8* 6.7 6.7  ALBUMIN 3.7 4.4 4.1  --   --    No results for input(s): LIPASE, AMYLASE in the last 8760 hours. No results for input(s): AMMONIA in the last 8760 hours. CBC: Recent Labs    09/14/17 1359 10/07/17 0818 04/18/18 0906  WBC 12.9* 6.3 4.8  NEUTROABS 10,655* 3,515 3,010  HGB 11.6* 11.4* 13.6  HCT 34.6* 35.1 40.5  MCV 88.7 87.8 92.3  PLT 529* 410* 323   Lipid Panel: Recent Labs    10/07/17 0818 04/18/18 0906  CHOL 196 187  HDL 57 46*  LDLCALC 120* 122*  TRIG 87 91  CHOLHDL 3.4 4.1   Lab Results  Component Value Date   HGBA1C 5.7 07/06/2017    Assessment/Plan 1. 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  2. 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 Osteopenia of multiple sites - per bone density 5/19, NOT osteoporosis so insurance won't cover prolia, did have prior use of fosamax after she had breast cancer, but does not recall taking arimidex, etc - Ambulatory referral to Rheumatology  4. Essential alopecia of women -continues to follow with Dr. Lois Huxley  5. Hyperlipidemia, unspecified hyperlipidemia type -will work on lowering her cholesterol in her diet  6. Essential hypertension - Blood Pressure Monitoring (BLOOD PRESSURE CUFF) MISC; Check blood pressure daily as directed.ICD-I10  Dispense: 1 each; Refill: 0  Labs/tests ordered:   Orders Placed This Encounter  Procedures  . Ambulatory referral to Rheumatology    Referral Priority:   Routine    Referral  Type:   Consultation    Referral Reason:   Specialty Services Required    Requested Specialty:   Rheumatology    Number of Visits Requested:   1   Next appt:  10/14/2018   Sandie Swayze L. Cinnamon Morency, D.O. [facility] [address], [place] [address] Cell Phone (Mon-Fri 8am-5pm):  [phone] On Call:  [phone] & follow prompts after 5pm & weekends Office Phone:  [phone] Office Fax:  [phone]

## 2018-04-21 NOTE — Patient Instructions (Signed)
Cut down on foods that have high saturated fat in them.

## 2018-05-08 ENCOUNTER — Other Ambulatory Visit: Payer: Self-pay | Admitting: Nurse Practitioner

## 2018-05-08 DIAGNOSIS — I10 Essential (primary) hypertension: Secondary | ICD-10-CM

## 2018-07-01 ENCOUNTER — Other Ambulatory Visit: Payer: Self-pay | Admitting: Internal Medicine

## 2018-07-01 DIAGNOSIS — I10 Essential (primary) hypertension: Secondary | ICD-10-CM

## 2018-08-12 ENCOUNTER — Other Ambulatory Visit: Payer: Self-pay | Admitting: Internal Medicine

## 2018-08-12 DIAGNOSIS — I10 Essential (primary) hypertension: Secondary | ICD-10-CM

## 2018-08-16 ENCOUNTER — Telehealth: Payer: Self-pay | Admitting: *Deleted

## 2018-08-16 NOTE — Telephone Encounter (Signed)
Pt calling with concerns about coming in the office due to the coronavirus. Pt wanted to know if it is ok to come in? appt 08/22/2018

## 2018-08-16 NOTE — Telephone Encounter (Signed)
If she is concerned that others may have coronavirus and she's wanting to avoid contact, at this time, we have not seen any cases and she should be ok to come in as long as she follows good handwashing technique.  As you know, we have an approach to handle potential cases if they do arrive at the office unexpectedly.  If she does want to postpone her visit and is doing fine without new concerns, it's also ok if she makes that choice.

## 2018-08-16 NOTE — Telephone Encounter (Signed)
Spoke with patient and advised results   

## 2018-08-22 ENCOUNTER — Encounter: Payer: Self-pay | Admitting: Internal Medicine

## 2018-08-22 ENCOUNTER — Other Ambulatory Visit: Payer: Self-pay

## 2018-08-22 ENCOUNTER — Ambulatory Visit: Payer: Medicare Other | Admitting: Internal Medicine

## 2018-08-22 VITALS — BP 122/60 | HR 73 | Temp 98.7°F | Ht 64.0 in | Wt 157.0 lb

## 2018-08-22 DIAGNOSIS — G7241 Inclusion body myositis [IBM]: Secondary | ICD-10-CM

## 2018-08-22 DIAGNOSIS — I10 Essential (primary) hypertension: Secondary | ICD-10-CM

## 2018-08-22 DIAGNOSIS — B353 Tinea pedis: Secondary | ICD-10-CM

## 2018-08-22 DIAGNOSIS — R29898 Other symptoms and signs involving the musculoskeletal system: Secondary | ICD-10-CM | POA: Diagnosis not present

## 2018-08-22 DIAGNOSIS — N3946 Mixed incontinence: Secondary | ICD-10-CM

## 2018-08-22 NOTE — Patient Instructions (Addendum)
lamisil powder to both feet after you dry them after bathing or swimming.

## 2018-08-22 NOTE — Progress Notes (Signed)
Location:  [facility] clinic Provider:  Johnathon Olden L. Mariea Clonts, D.O., C.M.D.  Code Status: DNR Goals of Care:  Advanced Directives 12/16/2017  Does Patient Have a Medical Advance Directive? No  Type of Advance Directive -  Does patient want to make changes to medical advance directive? -  Copy of [place] in Chart? -  Would patient like information on creating a medical advance directive? -     Chief Complaint  Patient presents with  . Medical Management of Chronic Issues    17mth follow-up    HPI: Patient is a 78 y.o. female seen today for medical management of chronic diseases.    Left foot is peeling quite a bit after pool therapy twice a week.  Has hammertoes also.  Discussed blow drying feet to prevent.  Right foot itches some, also.   Sleeps in her recliner.  Sometimes falls asleep there.  If her feet have perspired, that does not help.   Is to see Dr. Donn Pierini April 10th for her inclusion body myositis.    Some myalgias or right shoulder seem worse.  Now getting twinges in left shoulder as well.  Opening newpaper, turning on overhead lights brings it on.  May have some degree of OA also.  Using capsaicin cream now per pharmacist recommendation.  She does think it makes a difference if she's bothered in the morning when she's reaching to get items form the cabinet.    Recently started on detrol for bladder leakage when she went to see urology.  Initially thought it helped her cough or sneeze (stress) incontinence.  First was on myrbetriq and was still waking up with dampness on her pad.  May want to switch back to myrbetriq.    Past Medical History:  Diagnosis Date  . Alopecia, unspecified   . Breast cancer ([place])    stage 1; right  . Cataracts, bilateral   . Decreased rectal sphincter tone 07/01/12  . Diverticulosis   . Enterocele   . Esophageal stricture 07/01/12  . Hernia   . Hiatal hernia   . Hypertension   . Irritable bowel syndrome   . Lumbago   . Migraine  with aura, without mention of intractable migraine without mention of status migrainosus   . Mixed hearing loss, bilateral   . 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 Osteoarthrosis, unspecified whether generalized or localized, unspecified site   . Other and unspecified hyperlipidemia   . Plantar fascial fibromatosis   . Rectal prolapse   . Reflux esophagitis   . Regional enteritis of small intestine ([place])   . Senile osteoporosis   . Spasm of muscle   . Unspecified constipation   . Unspecified hypothyroidism     Past Surgical History:  Procedure Laterality Date  . APPENDECTOMY    . BASAL CELL CARCINOMA EXCISION  1978   neck  . BREAST LUMPECTOMY Right    snbx, apbi  . Cataract removal OD  06/28/2012   . Cataract removal OS  12/13/2012  . dermatolfibroma  2012  . DILATION AND CURETTAGE OF UTERUS    . ENTEROCELE REPAIR    . HAIR TRANSPLANT  2008  . HERNIA REPAIR    . IR FLUORO GUIDE CV LINE RIGHT  07/19/2017  . IR PATIENT EVAL TECH 0-60 MINS  07/29/2017  . IR US GUIDE VASC ACCESS RIGHT  07/19/2017  . OOPHORECTOMY    . strabisumus eye surgery     x 2  . TONSILLECTOMY  Allergies  Allergen Reactions  . Gabapentin Other (See Comments)    Balance disturbance  . Cetacaine [Butamben-Tetracaine-Benzocaine] Other (See Comments)    LOST HER SENSE OF TASTE FOR OVER A MONTH!!!    Outpatient Encounter Medications as of 08/22/2018  Medication Sig  . acetaminophen (TYLENOL) 500 MG tablet Take 500 mg by mouth every 6 (six) hours as needed for mild pain.   [name] BYSTOLIC 5 MG tablet TAKE 1 TABLET BY MOUTH EVERY DAY  . CALCIUM-VITAMIN D PO Take 1 capsule by mouth daily.  . diphenhydrAMINE (BENADRYL) 25 MG tablet Take 12.5-25 mg by mouth at bedtime as needed for sleep.   . flutamide (EULEXIN) 125 MG capsule Take 125 mg by mouth two times a day  . hyoscyamine (LEVSIN SL) 0.125 MG SL tablet DISSOLVE 1 TABLET UNDER TONGUE EVERY 4-6 HOURS AS NEEDED FOR DIARRHEA  . losartan  (COZAAR) 100 MG tablet TAKE 1 TABLET BY MOUTH EVERY DAY  . minoxidil (LONITEN) 2.5 MG tablet 1.25mg  by mouth daily  . Multiple Vitamins-Minerals (MULTIVITAMIN & MINERAL PO) Take 1 tablet by mouth daily.  [name] Neomycin-Bacitracin-Polymyxin (HCA TRIPLE ANTIBIOTIC OINTMENT EX) Apply 1 application topically daily as needed (irritation to nares).  . pantoprazole (PROTONIX) 40 MG tablet TAKE 1 TABLET BY MOUTH EVERY OTHER DAY  . Probiotic Product (PROBIOTIC PO) Take 1 tablet by mouth daily.  [name] tolterodine (DETROL LA) 4 MG 24 hr capsule Take by mouth daily.  . vitamin C (ASCORBIC ACID) 500 MG tablet Take 500 mg by mouth daily.  [name] Dextrin-Calcium (CVS EASY FIBER/CALCIUM PO) Take 1 tablet by mouth daily.    No facility-administered encounter medications on file as of 08/22/2018.     Review of Systems:  Review of Systems  Constitutional: Negative for chills and fever.  HENT: Negative for congestion.   Eyes: Negative for blurred vision.  Respiratory: Negative for cough and shortness of breath.   Cardiovascular: Negative for chest pain, palpitations and leg swelling.  Gastrointestinal: Negative for abdominal pain, blood in stool, constipation, diarrhea and melena.  Genitourinary: Negative for dysuria.       Leakage overnight and some urgency; stress portion might be slightly better  Musculoskeletal: Positive for joint pain and myalgias. Negative for falls.       Bilateral shoulders with abduction or flexion primarily  Skin: Positive for itching. Negative for rash.       Scaly peeling of feet, left greater than right and b/w toes  Neurological: Negative for dizziness, loss of consciousness and headaches.  Endo/Heme/Allergies: Does not bruise/bleed easily.  Psychiatric/Behavioral: Negative for depression and memory loss. The patient is not nervous/anxious and does not have insomnia.     Health Maintenance  Topic Date Due  . MAMMOGRAM  10/27/2018  . TETANUS/TDAP  12/06/2021  . INFLUENZA VACCINE   Completed  . DEXA SCAN  Completed  . PNA vac Low Risk Adult  Completed    Physical Exam: Vitals:   08/22/18 1438  BP: 122/60  Pulse: 73  Temp: 98.7 F (37.1 C)  TempSrc: Oral  SpO2: 97%  Weight: 157 lb (71.2 kg)  Height: 5\' 4"  (1.626 m)   Body mass index is 26.95 kg/m. Physical Exam Vitals signs reviewed.  Constitutional:      General: She is not in acute distress.    Appearance: Normal appearance. She is normal weight. She is not ill-appearing or toxic-appearing.  HENT:     Head: Normocephalic and atraumatic.  Cardiovascular:     Rate and Rhythm:  Normal rate and regular rhythm.     Pulses: Normal pulses.     Heart sounds: Normal heart sounds.  Pulmonary:     Effort: Pulmonary effort is normal.     Breath sounds: Normal breath sounds.  Musculoskeletal: Normal range of motion.     Comments: Loss of thoracic kyphosis notable/reversed; notable pain over rotator cuffs with abduction and flexion of arms  Skin:    General: Skin is warm and dry.     Comments: Scaly skin b/w toes and on plantar surface of left foot with a large piece of skin that peeled off evidence, slight bit on heel of right foot also; some pruritus present  Neurological:     General: No focal deficit present.     Mental Status: She is alert and oriented to person, place, and time.  Psychiatric:        Mood and Affect: Mood normal.        Behavior: Behavior normal.        Thought Content: Thought content normal.        Judgment: Judgment normal.     Labs reviewed: Basic Metabolic Panel: Recent Labs    09/14/17 1359 10/07/17 0818 04/18/18 0906  NA 138 139 140  K 4.5 4.1 4.7  CL 103 103 103  CO2 29 29 29   GLUCOSE 92 77 94  BUN 24 24 18   CREATININE 1.01* 0.87 0.77  CALCIUM 9.7 9.1 9.7   Liver Function Tests: Recent Labs    10/07/17 0818 04/18/18 0906  AST 24 16  ALT 17 12  BILITOT 0.4 0.5  PROT 6.7 6.7   No results for input(s): LIPASE, AMYLASE in the last 8760 hours. No results  for input(s): AMMONIA in the last 8760 hours. CBC: Recent Labs    09/14/17 1359 10/07/17 0818 04/18/18 0906  WBC 12.9* 6.3 4.8  NEUTROABS 10,655* 3,515 3,010  HGB 11.6* 11.4* 13.6  HCT 34.6* 35.1 40.5  MCV 88.7 87.8 92.3  PLT 529* 410* 323   Lipid Panel: Recent Labs    10/07/17 0818 04/18/18 0906  CHOL 196 187  HDL 57 46*  LDLCALC 120* 122*  TRIG 87 91  CHOLHDL 3.4 4.1   Lab Results  Component Value Date   HGBA1C 5.7 07/06/2017    Procedures since last visit: No results found.  Assessment/Plan  1. Inclusion body myositis ([employer]) -may have some slight increase in upper extremity involvement, but seems the pain is more likely OA of shoulders or tendonitis as condition does not typically cause pain -she's using capsaicin cream with some benefit  2. Dropped head syndrome -ongoing, but no worse  -cont swimming for this and above  3. Tinea pedis of both feet -recommended lamisil powder to feet after thorough drying after she swims or showers  4. Essential hypertension -bp well controlled w/o dizziness -cont same regimen unless she notes a problem with dizziness or home readings are low  5. Urinary incontinence, mixed -discussed a bit today--saw Dr. McDiarmid and had tried myrbetriq but stopped due to still having some overnight leakage, but now on detrol which may have helped som leakage with cough sneeze, but overall not impressive--feels like myrbetriq did better and fewer side effects potential so wants to go back on it--plans to clear with urology  Labs/tests ordered: No orders of the defined types were placed in this encounter.  Next appt:  10/14/2018   Holliday Sheaffer L. Anjel Perfetti, D.O. [facility] [address], [place] [address] Cell Phone (Mon-Fri 8am-5pm):  [phone] On Call:  [phone] & follow prompts after 5pm & weekends Office Phone:  [phone] Office Fax:  [phone]

## 2018-09-14 ENCOUNTER — Telehealth: Payer: Self-pay | Admitting: *Deleted

## 2018-09-14 NOTE — Telephone Encounter (Signed)
Patient stated that you suggested at last appointment that you could prescribe her urology medication. Patient is requesting to go back on Myrebetriq and wants a Rx sent to her pharmacy. Please Advise.

## 2018-09-14 NOTE — Telephone Encounter (Signed)
Let's restart myrbetriq 25mg  daily 90 day supply.

## 2018-09-15 MED ORDER — MIRABEGRON ER 25 MG PO TB24: 25.0000 mg | ORAL_TABLET | Freq: Every day | ORAL | 0 refills | Status: DC

## 2018-09-15 NOTE — Telephone Encounter (Signed)
Medication list updated and Rx sent to pharmacy. Patient notified.

## 2018-09-29 ENCOUNTER — Other Ambulatory Visit: Payer: Self-pay

## 2018-09-29 MED ORDER — PANTOPRAZOLE SODIUM 40 MG PO TBEC: 40.0000 mg | DELAYED_RELEASE_TABLET | ORAL | 2 refills | Status: DC

## 2018-10-02 ENCOUNTER — Encounter: Payer: Self-pay | Admitting: Internal Medicine

## 2018-10-10 ENCOUNTER — Ambulatory Visit: Payer: Medicare Other | Admitting: Internal Medicine

## 2018-10-10 ENCOUNTER — Other Ambulatory Visit: Payer: Self-pay

## 2018-10-14 ENCOUNTER — Encounter: Payer: Self-pay | Admitting: Nurse Practitioner

## 2018-10-14 ENCOUNTER — Ambulatory Visit: Payer: Self-pay

## 2018-10-26 IMAGING — CT CT CHEST W/ CM
1 series · 15 of 34 positions shown, 19 images · IV contrast (APPLIED)
Comparison: 08/14/2009

CLINICAL DATA: Myasthenia gravis history of right breast cancer
with lumpectomy

EXAM:
CT CHEST WITH CONTRAST
TECHNIQUE: Multidetector CT imaging of the chest was performed during
intravenous contrast administration.
CONTRAST:  75mL [id number] IOPAMIDOL ([id number]) INJECTION 61%

[Series 3: chest w/cm · axial · 0.72mm/px · z∈[-379,-105]mm · 15 of 161 slices shown, 19 images]
[im 12/161  mediastinal]
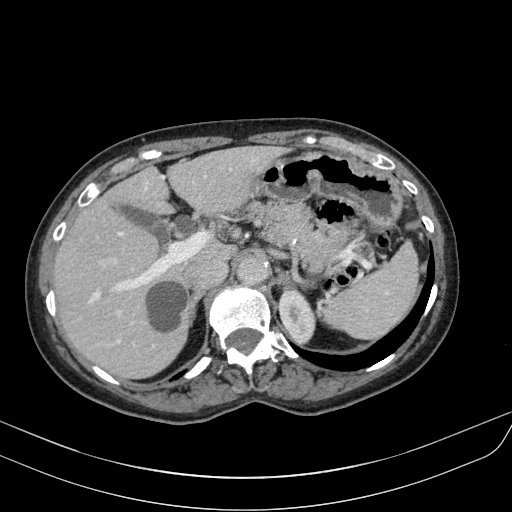
[im 12/161  lung]
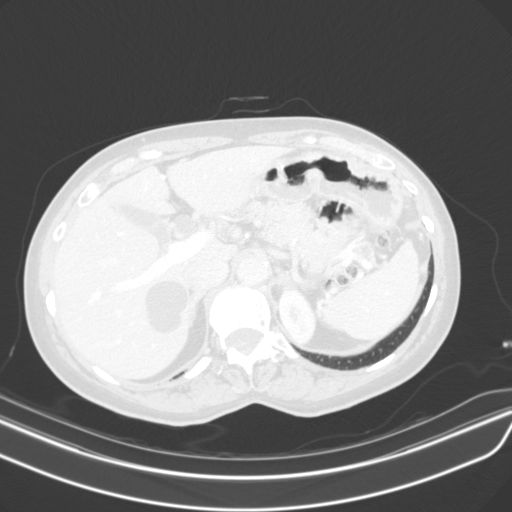
[im 24/161  lung]
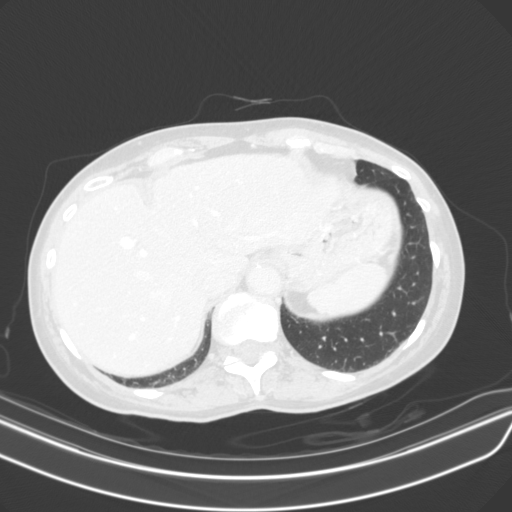
[im 33/161  lung]
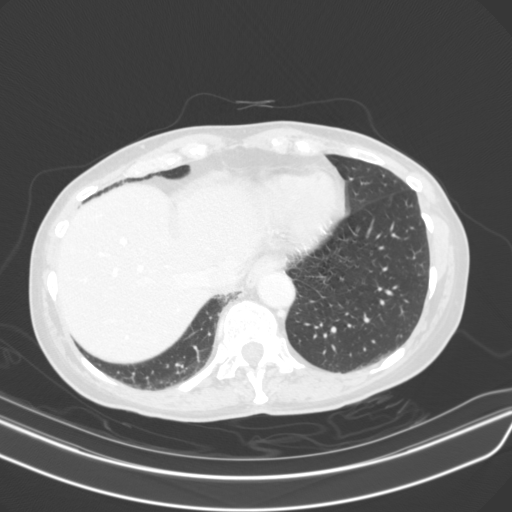
[im 42/161  lung]
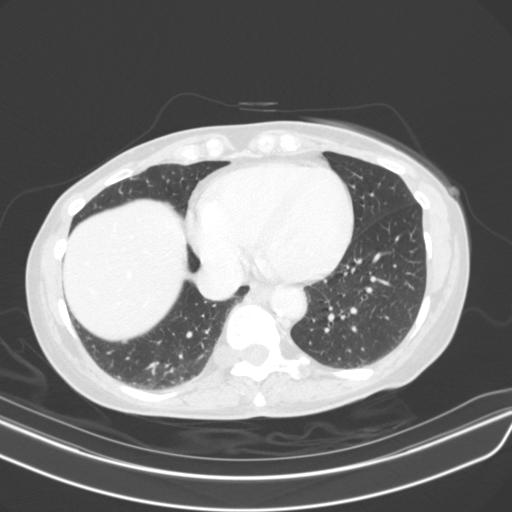
[im 54/161  mediastinal]
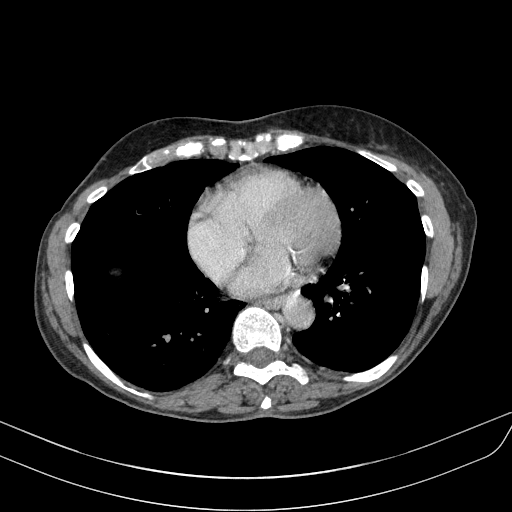
[im 54/161  lung]
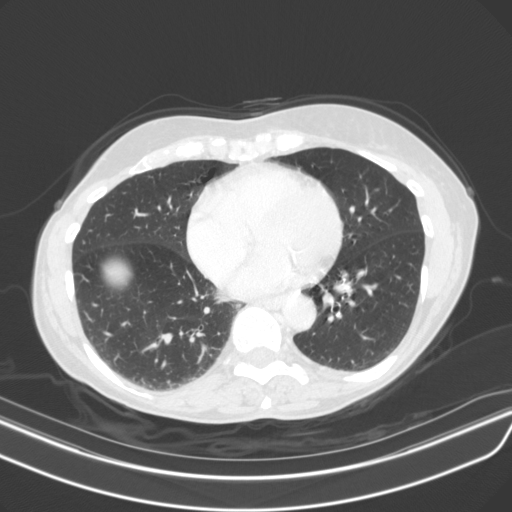
[im 65/161  lung]
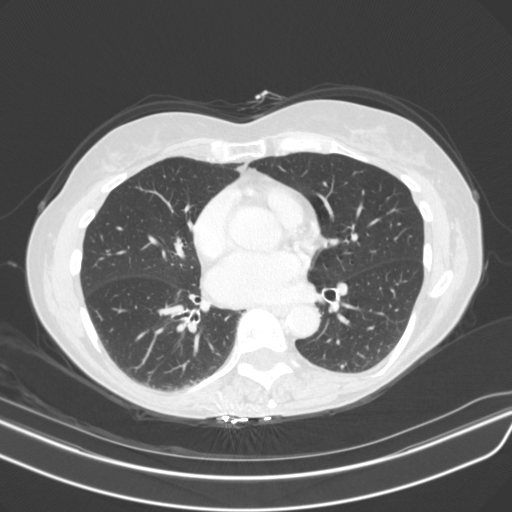
[im 72/161  lung]
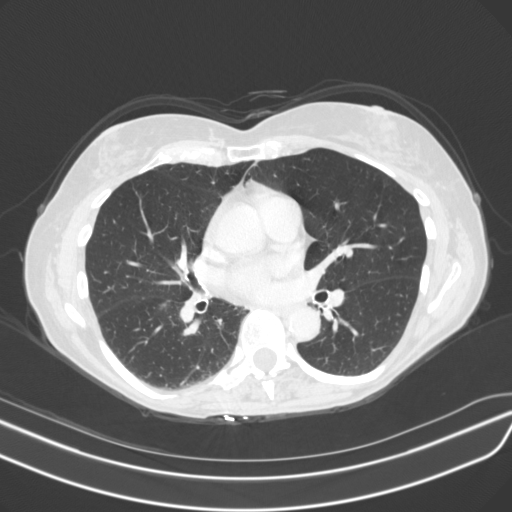
[im 83/161  lung]
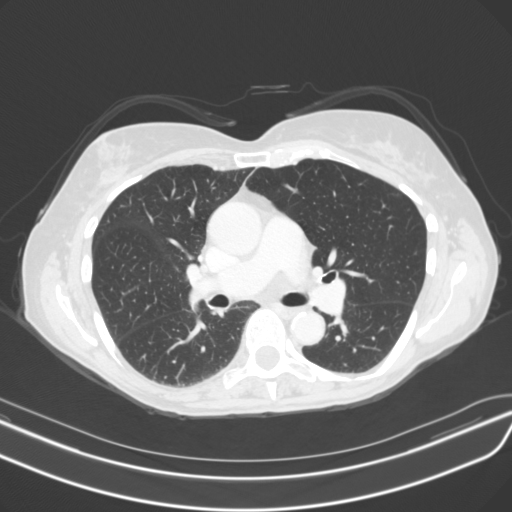
[im 89/161  mediastinal]
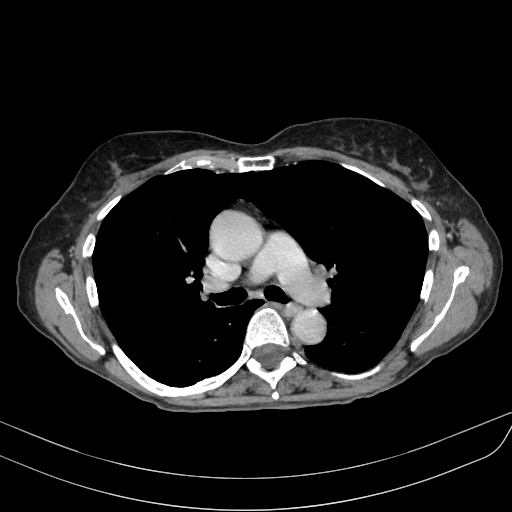
[im 89/161  lung]
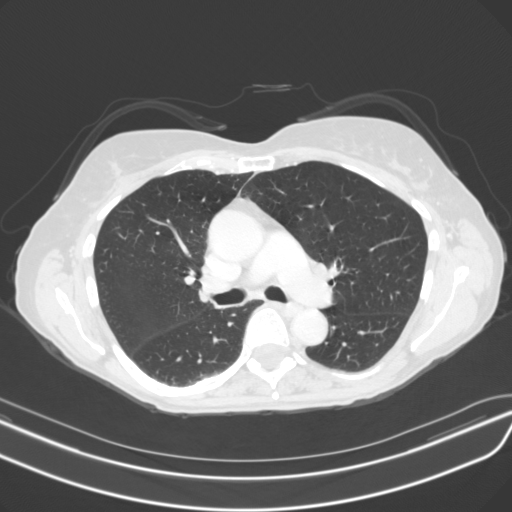
[im 97/161  lung]
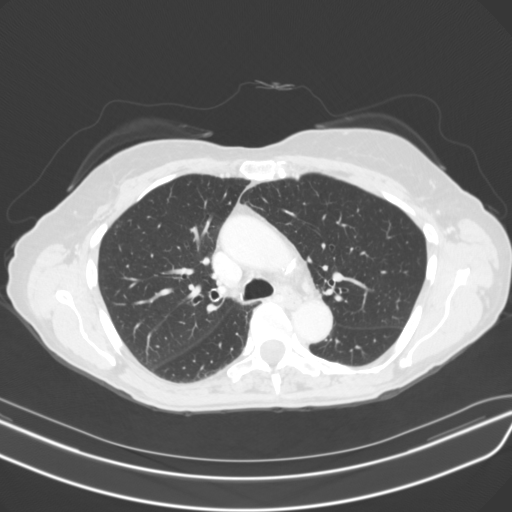
[im 107/161  lung]
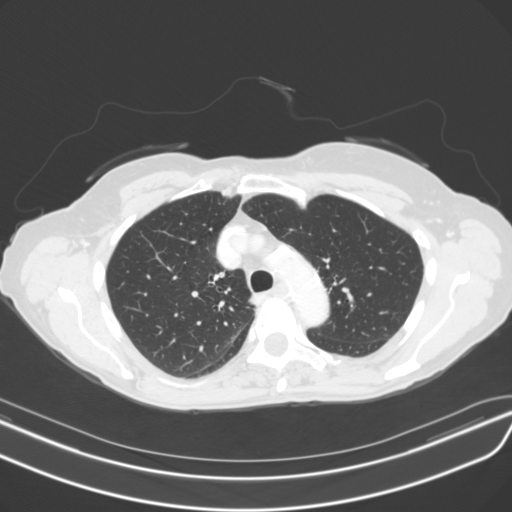
[im 119/161  lung]
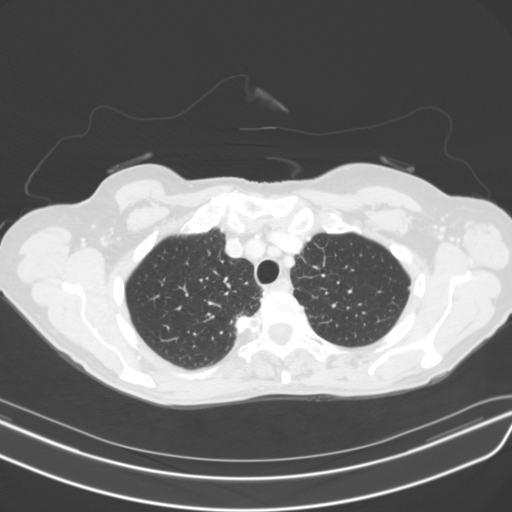
[im 129/161  mediastinal]
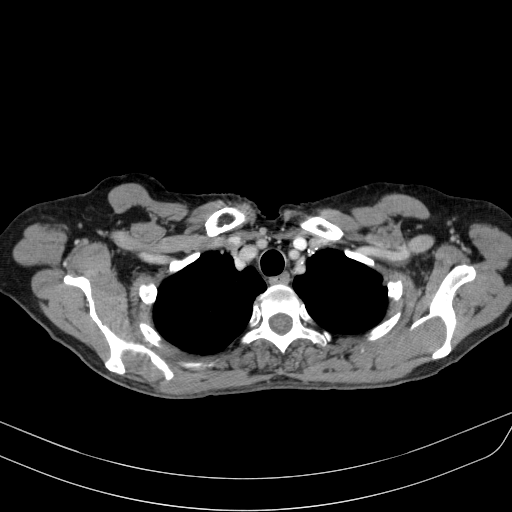
[im 129/161  lung]
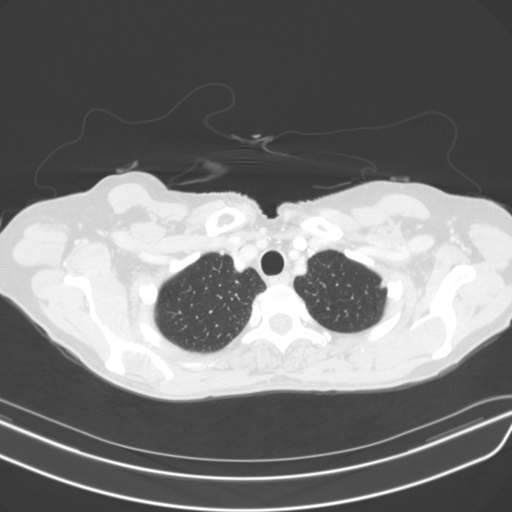
[im 137/161  lung]
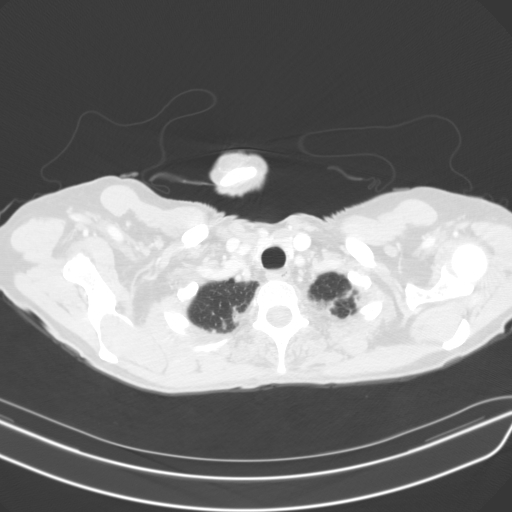
[im 149/161  lung]
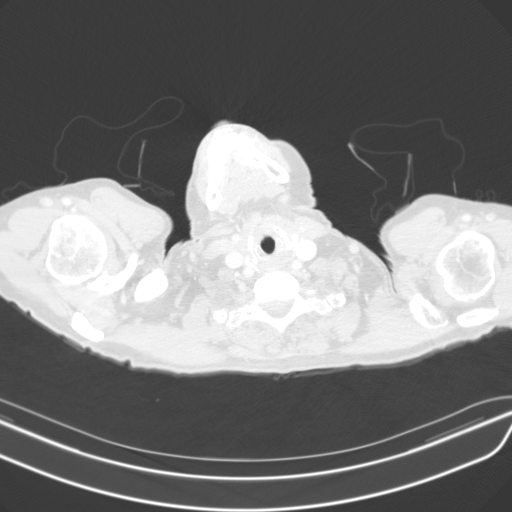

[15 of 34 positions shown; findings below may reference images not displayed]

FINDINGS: Cardiovascular: Ectatic ascending aorta, measuring up to 3.7 cm.
Moderate atherosclerotic calcification. Minimal coronary vessel
calcification. Normal heart size. Small quantity of pericardial
effusion.

Mediastinum/Nodes: Midline trachea. No thyroid mass. No evidence for
mediastinal mass. Esophagus within normal limits.

Lungs/Pleura: Biapical scarring. No acute consolidation or effusion.
Negative for a pneumothorax.

Upper Abdomen: No calcified gallstones. Enlarged extrahepatic common
bile duct measuring 13 mm at the porta hepatis on coronal views.
cm cyst in the right hepatic lobe.

Musculoskeletal: No acute or suspicious abnormality
IMPRESSION: 1. Negative for mediastinal mass lesion.
2. Mild biapical pleural and parenchymal scarring.
3. Enlarged extrahepatic common bile duct, suggest correlation with
appropriate laboratory values.

Aortic Atherosclerosis ([id number]).

## 2018-11-07 ENCOUNTER — Other Ambulatory Visit: Payer: Self-pay | Admitting: Internal Medicine

## 2018-11-07 DIAGNOSIS — I10 Essential (primary) hypertension: Secondary | ICD-10-CM

## 2018-11-09 ENCOUNTER — Encounter: Payer: Self-pay | Admitting: Family

## 2018-11-09 ENCOUNTER — Ambulatory Visit: Payer: Medicare Other | Admitting: Family

## 2018-11-09 ENCOUNTER — Other Ambulatory Visit: Payer: Self-pay

## 2018-11-09 ENCOUNTER — Ambulatory Visit (INDEPENDENT_AMBULATORY_CARE_PROVIDER_SITE_OTHER): Payer: Medicare Other | Admitting: Family

## 2018-11-09 DIAGNOSIS — Z0000 Encounter for general adult medical examination without abnormal findings: Secondary | ICD-10-CM

## 2018-11-09 DIAGNOSIS — E785 Hyperlipidemia, unspecified: Secondary | ICD-10-CM

## 2018-11-09 DIAGNOSIS — M8589 Other specified disorders of bone density and structure, multiple sites: Secondary | ICD-10-CM | POA: Diagnosis not present

## 2018-11-09 DIAGNOSIS — I10 Essential (primary) hypertension: Secondary | ICD-10-CM

## 2018-11-09 NOTE — Patient Instructions (Signed)
[name] , Thank you for taking time to come for your Medicare Wellness Visit. I appreciate your ongoing commitment to your health goals. Please review the following plan we discussed and let me know if I can assist you in the future.   Screening recommendations/referrals: Colonoscopy: Up to date  Mammogram :Due once COVID-19 restrictions are over  Bone Density: Up to date  Recommended yearly ophthalmology/optometry visit for glaucoma screening and checkup Recommended yearly dental visit for hygiene and checkup  Vaccinations: Influenza vaccine: Up to date  Pneumococcal vaccine: Up to date  Tdap vaccine Up to date  Shingles vaccine Up to date     Advanced directives:Information mailed   Conditions/risks identified: Advance age > 54 yrs,sedentary lifestyle,Hypertension   Next appointment: 1 years    Preventive Care 70 Years and Older, Female Preventive care refers to lifestyle choices and visits with your health care provider that can promote health and wellness. What does preventive care include?  A yearly physical exam. This is also called an annual well check.  Dental exams once or twice a year.  Routine eye exams. Ask your health care provider how often you should have your eyes checked.  Personal lifestyle choices, including:  Daily care of your teeth and gums.  Regular physical activity.  Eating a healthy diet.  Avoiding tobacco and drug use.  Limiting alcohol use.  Practicing safe sex.  Taking low-dose aspirin every day.  Taking vitamin and mineral supplements as recommended by your health care provider. What happens during an annual well check? The services and screenings done by your health care provider during your annual well check will depend on your age, overall health, lifestyle risk factors, and family history of disease. Counseling  Your health care provider may ask you questions about your:  Alcohol use.  Tobacco use.  Drug use.  Emotional  well-being.  Home and relationship well-being.  Sexual activity.  Eating habits.  History of falls.  Memory and ability to understand (cognition).  Work and work Statistician.  Reproductive health. Screening  You may have the following tests or measurements:  Height, weight, and BMI.  Blood pressure.  Lipid and cholesterol levels. These may be checked every 5 years, or more frequently if you are over 32 years old.  Skin check.  Lung cancer screening. You may have this screening every year starting at age 62 if you have a 30-pack-year history of smoking and currently smoke or have quit within the past 15 years.  Fecal occult blood test (FOBT) of the stool. You may have this test every year starting at age 20.  Flexible sigmoidoscopy or colonoscopy. You may have a sigmoidoscopy every 5 years or a colonoscopy every 10 years starting at age 51.  Hepatitis C blood test.  Hepatitis B blood test.  Sexually transmitted disease (STD) testing.  Diabetes screening. This is done by checking your blood sugar (glucose) after you have not eaten for a while (fasting). You may have this done every 1-3 years.  Bone density scan. This is done to screen for osteoporosis. You may have this done starting at age 31.  Mammogram. This may be done every 1-2 years. Talk to your health care provider about how often you should have regular mammograms. Talk with your health care provider about your test results, treatment options, and if necessary, the need for more tests. Vaccines  Your health care provider may recommend certain vaccines, such as:  Influenza vaccine. This is recommended every year.  Tetanus,  diphtheria, and acellular pertussis (Tdap, Td) vaccine. You may need a Td booster every 10 years.  Zoster vaccine. You may need this after age 48.  Pneumococcal 13-valent conjugate (PCV13) vaccine. One dose is recommended after age 55.  Pneumococcal polysaccharide (PPSV23) vaccine. One  dose is recommended after age 59. Talk to your health care provider about which screenings and vaccines you need and how often you need them. This information is not intended to replace advice given to you by your health care provider. Make sure you discuss any questions you have with your health care provider. Document Released: 06/21/2015 Document Revised: 02/12/2016 Document Reviewed: 03/26/2015 Elsevier Interactive Patient Education  2017 [employer] Prevention in the Home Falls can cause injuries. They can happen to people of all ages. There are many things you can do to make your home safe and to help prevent falls. What can I do on the outside of my home?  Regularly fix the edges of walkways and driveways and fix any cracks.  Remove anything that might make you trip as you walk through a door, such as a raised step or threshold.  Trim any bushes or trees on the path to your home.  Use bright outdoor lighting.  Clear any walking paths of anything that might make someone trip, such as rocks or tools.  Regularly check to see if handrails are loose or broken. Make sure that both sides of any steps have handrails.  Any raised decks and porches should have guardrails on the edges.  Have any leaves, snow, or ice cleared regularly.  Use sand or salt on walking paths during winter.  Clean up any spills in your garage right away. This includes oil or grease spills. What can I do in the bathroom?  Use night lights.  Install grab bars by the toilet and in the tub and shower. Do not use towel bars as grab bars.  Use non-skid mats or decals in the tub or shower.  If you need to sit down in the shower, use a plastic, non-slip stool.  Keep the floor dry. Clean up any water that spills on the floor as soon as it happens.  Remove soap buildup in the tub or shower regularly.  Attach bath mats securely with double-sided non-slip rug tape.  Do not have throw rugs and other  things on the floor that can make you trip. What can I do in the bedroom?  Use night lights.  Make sure that you have a light by your bed that is easy to reach.  Do not use any sheets or blankets that are too big for your bed. They should not hang down onto the floor.  Have a firm chair that has side arms. You can use this for support while you get dressed.  Do not have throw rugs and other things on the floor that can make you trip. What can I do in the kitchen?  Clean up any spills right away.  Avoid walking on wet floors.  Keep items that you use a lot in easy-to-reach places.  If you need to reach something above you, use a strong step stool that has a grab bar.  Keep electrical cords out of the way.  Do not use floor polish or wax that makes floors slippery. If you must use wax, use non-skid floor wax.  Do not have throw rugs and other things on the floor that can make you trip. What can I do with  my stairs?  Do not leave any items on the stairs.  Make sure that there are handrails on both sides of the stairs and use them. Fix handrails that are broken or loose. Make sure that handrails are as long as the stairways.  Check any carpeting to make sure that it is firmly attached to the stairs. Fix any carpet that is loose or worn.  Avoid having throw rugs at the top or bottom of the stairs. If you do have throw rugs, attach them to the floor with carpet tape.  Make sure that you have a light switch at the top of the stairs and the bottom of the stairs. If you do not have them, ask someone to add them for you. What else can I do to help prevent falls?  Wear shoes that:  Do not have high heels.  Have rubber bottoms.  Are comfortable and fit you well.  Are closed at the toe. Do not wear sandals.  If you use a stepladder:  Make sure that it is fully opened. Do not climb a closed stepladder.  Make sure that both sides of the stepladder are locked into place.  Ask  someone to hold it for you, if possible.  Clearly mark and make sure that you can see:  Any grab bars or handrails.  First and last steps.  Where the edge of each step is.  Use tools that help you move around (mobility aids) if they are needed. These include:  Canes.  Walkers.  Scooters.  Crutches.  Turn on the lights when you go into a dark area. Replace any light bulbs as soon as they burn out.  Set up your furniture so you have a clear path. Avoid moving your furniture around.  If any of your floors are uneven, fix them.  If there are any pets around you, be aware of where they are.  Review your medicines with your doctor. Some medicines can make you feel dizzy. This can increase your chance of falling. Ask your doctor what other things that you can do to help prevent falls. This information is not intended to replace advice given to you by your health care provider. Make sure you discuss any questions you have with your health care provider. Document Released: 03/21/2009 Document Revised: 10/31/2015 Document Reviewed: 06/29/2014 Elsevier Interactive Patient Education  2017 [employer].

## 2018-11-09 NOTE — Progress Notes (Addendum)
This service is provided via telemedicine  No vital signs collected/recorded due to the encounter was a telemedicine visit.   Location of patient (ex: home, work):  Home  Patient consents to a telephone visit:  Yes  Location of the provider (ex: office, home):  Office  Name of any referring provider: Dr. Hollace Kinnier  Names of all persons participating in the telemedicine service and their role in the encounter:  Ruthell Rummage CMA, Maudie Shingledecker NP, Jeanella Anton   Time spent on call:  Ruthell Rummage CMA spent 29 minutes on phone with patient.     Location:   [facility]   Place of Service:   [place]  Provider: Marlowe Sax, NP   Code Status: FULL Goals of Care:  Advanced Directives 11/09/2018  Does Patient Have a Medical Advance Directive? No  Type of Advance Directive -  Does patient want to make changes to medical advance directive? -  Copy of [place] in Chart? -  Would patient like information on creating a medical advance directive? -     Chief Complaint  Patient presents with   Medical Management of Chronic Issues    Medication Management would like to discuss calium with vitamin d tablet and  the dosage of vitamin c     HPI: Patient is a 78 y.o. female seen today for medical management of chronic diseases.she states would like to discuss her medication supplement.she takes calcium with vit D,Multivitamin and Vit D3.she wonders whether vit D3 is too much.she does eat all her meals though has not been cooking eats more frozen dinner.Her chart reviewed had a Dexa scan which showed multiple site osteopenia.she has taken Fosmax in the past till 2017.No recent fall episodes or fractures.Has a significant medical history of breast cancer.  She would like lab work to be ordered prior to her next 4 month follow up appointment with Dr.Reed in July,2020.   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[place])    Osteoarthrosis, unspecified whether generalized or localized, unspecified site    Other and unspecified hyperlipidemia    Plantar fascial fibromatosis    Rectal prolapse    Reflux esophagitis    Regional enteritis of small intestine ([place])    Senile osteoporosis    Spasm of muscle    Unspecified constipation    Unspecified hypothyroidism     Past Surgical History:  Procedure Laterality Date   APPENDECTOMY     BASAL CELL CARCINOMA EXCISION  1978   neck   BREAST LUMPECTOMY Right    snbx, apbi   Cataract removal OD  06/28/2012    Cataract removal OS  12/13/2012   dermatolfibroma  2012   DILATION AND CURETTAGE OF UTERUS     ENTEROCELE REPAIR     HAIR TRANSPLANT  2008   HERNIA REPAIR     IR FLUORO GUIDE CV LINE RIGHT  07/19/2017   IR PATIENT EVAL TECH 0-60 MINS  07/29/2017  IR US GUIDE VASC ACCESS RIGHT  07/19/2017   OOPHORECTOMY     strabisumus eye surgery     x 2   TONSILLECTOMY      Allergies  Allergen Reactions   Gabapentin Other (See Comments)    Balance disturbance   Cetacaine [Butamben-Tetracaine-Benzocaine] Other (See Comments)    LOST HER SENSE OF TASTE FOR OVER A MONTH!!!    Outpatient Encounter Medications as of 11/09/2018  Medication Sig   acetaminophen (TYLENOL) 500 MG tablet Take 500 mg by mouth every 6 (six) hours as needed for mild pain. Patient states every 8 hours as well   CALCIUM-VITAMIN D PO Take 1 capsule by mouth daily.   diphenhydrAMINE (BENADRYL) 25 MG tablet Take 12.5-25 mg by mouth at bedtime as  needed for sleep.    flutamide (EULEXIN) 125 MG capsule Take 125 mg by mouth two times a day   hyoscyamine (LEVSIN SL) 0.125 MG SL tablet DISSOLVE 1 TABLET UNDER TONGUE EVERY 4-6 HOURS AS NEEDED FOR DIARRHEA   losartan (COZAAR) 100 MG tablet TAKE 1 TABLET BY MOUTH EVERY DAY   Melatonin 3 MG TABS Take 1 tablet by mouth as needed. Alternate taking between this and benadryl   minoxidil (LONITEN) 2.5 MG tablet 1.95m by mouth daily   mirabegron ER (MYRBETRIQ) 25 MG TB24 tablet Take 1 tablet (25 mg total) by mouth daily.   Multiple Vitamins-Minerals (MULTIVITAMIN & MINERAL PO) Take 1 tablet by mouth daily.   nebivolol (BYSTOLIC) 5 MG tablet Take 1 tablet (5 mg total) by mouth daily.   Neomycin-Bacitracin-Polymyxin (HCA TRIPLE ANTIBIOTIC OINTMENT EX) Apply 1 application topically daily as needed (irritation to nares).   pantoprazole (PROTONIX) 40 MG tablet Take 1 tablet (40 mg total) by mouth every other day.   Probiotic Product (PROBIOTIC PO) Take 1 tablet by mouth daily.   Psyllium ([employer] DAILY FIBER PO) Patient takes 1 tablespoon of powder by mouth daily   vitamin C (ASCORBIC ACID) 500 MG tablet Take 500 mg by mouth daily.   [DISCONTINUED] Wheat Dextrin-Calcium ([employer] EASY FIBER/CALCIUM PO) Take 1 tablet by mouth daily.    No facility-administered encounter medications on file as of 11/09/2018.     Review of Systems:  Review of Systems  Constitutional: Negative for appetite change, chills and fever.  HENT: Negative for congestion, rhinorrhea, sinus pressure, sinus pain, sneezing and sore throat.   Eyes: Negative for discharge, redness and itching.  Respiratory: Negative for cough, chest tightness, shortness of breath and wheezing.   Gastrointestinal: Negative for abdominal distention, abdominal pain, constipation, diarrhea, nausea and vomiting.  Musculoskeletal: Positive for arthralgias.  Skin: Negative for color change, pallor and rash.  Neurological: Negative for dizziness,  light-headedness and headaches.    Health Maintenance  Topic Date Due   MAMMOGRAM  10/27/2018   INFLUENZA VACCINE  01/07/2019   TETANUS/TDAP  12/06/2021   DEXA SCAN  Completed   PNA vac Low Risk Adult  Completed    Physical Exam: There were no vitals filed for this visit. There is no height or weight on file to calculate BMI. Physical Exam Unable to complete on telephone visit.   Labs reviewed: Basic Metabolic Panel: Recent Labs    04/18/18 0906  NA 140  K 4.7  CL 103  CO2 29  GLUCOSE 94  BUN 18  CREATININE 0.77  CALCIUM 9.7   Liver Function Tests: Recent Labs    04/18/18 0906  AST 16  ALT 12  BILITOT 0.5  PROT 6.7  CBC: Recent Labs    04/18/18 0906  WBC 4.8  NEUTROABS 3,010  HGB 13.6  HCT 40.5  MCV 92.3  PLT 323   Lipid Panel: Recent Labs    04/18/18 0906  CHOL 187  HDL 46*  LDLCALC 122*  TRIG 91  CHOLHDL 4.1   Lab Results  Component Value Date   HGBA1C 5.7 07/06/2017    Procedures since last visit: No results found.  Assessment/Plan 1. Osteopenia of multiple sites Per Dexa scan done 10/27/2017.continue on calcium+vit D one daily and MVI  2. Essential hypertension Continue on losartan 100 mg tablet daily,minoxidil 1.25 mg tablet daily and nebivolol 5 mg tablet daily. - CBC with Differential/Platelet; Future - CMP with eGFR([facility]); Future - TSH; Future  3. Hyperlipidemia, unspecified hyperlipidemia type LDL 122 ( 04/18/2018).encouraged low carbo,low saturated fats and high vegetables diet and increase physical activity/exercise as tolerated.   - Lipid panel; Future  Labs/tests ordered:   - CBC with Differential/Platelet; Future - CMP with eGFR([facility]); Future - TSH; Future - Lipid panel; Future  Next appt:  12/29/2018  Time spent with patient 15 minutes >50% time spent counseling; reviewing medical record; tests; labs; and developing future plan of care

## 2018-11-09 NOTE — Progress Notes (Signed)
Subjective:   Marilyn [name] is a 78 y.o. female who presents for Medicare Annual (Subsequent) preventive examination.  Review of Systems:   Cardiac Risk Factors include: advanced age (>8mn, >>5women);sedentary lifestyle;hypertension     Objective:     Vitals: There were no vitals taken for this visit.  There is no height or weight on file to calculate BMI.  Advanced Directives 11/09/2018 11/09/2018 12/16/2017 10/11/2017 07/20/2017 07/19/2017 05/15/2017  Does Patient Have a Medical Advance Directive? No No No Yes - No No  Type of Advance Directive - - - Living will;Healthcare Power of Attorney - - -  Does patient want to make changes to medical advance directive? - - - No - Patient declined - - -  Copy of [place] Chart? - - - No - copy requested - - -  Would patient like information on creating a medical advance directive? - - - - No - Patient declined - -    Tobacco Social History   Tobacco Use  Smoking Status Never Smoker  Smokeless Tobacco Never Used     Counseling given: Not Answered   Clinical Intake:  Pre-visit preparation completed: No  Pain : No/denies pain     Nutritional Risks: None Diabetes: No  How often do you need to have someone help you when you read instructions, pamphlets, or other written materials from your doctor or pharmacy?: 1 - Never What is the last grade level you completed in school?: Graduate school 17 Yrs   Interpreter Needed?: No  Information entered by :: Hurschel Paynter FNP-C   Past Medical History:  Diagnosis Date  . Alopecia, unspecified   . Breast cancer ([place]    stage 1; right  . Cataracts, bilateral   . Decreased rectal sphincter tone 07/01/12  . Diverticulosis   . Enterocele   . Esophageal stricture 07/01/12  . Hernia   . Hiatal hernia   . Hypertension   . Irritable bowel syndrome   . Lumbago   . Migraine with aura, without mention of intractable migraine without mention of status migrainosus   .  Mixed hearing loss, bilateral   . Obsessive-compulsive personality disorder ([place]   . Osteoarthrosis, unspecified whether generalized or localized, unspecified site   . Other and unspecified hyperlipidemia   . Plantar fascial fibromatosis   . Rectal prolapse   . Reflux esophagitis   . Regional enteritis of small intestine ([place]   . Senile osteoporosis   . Spasm of muscle   . Unspecified constipation   . Unspecified hypothyroidism    Past Surgical History:  Procedure Laterality Date  . APPENDECTOMY    . BASAL CELL CARCINOMA EXCISION  1978   neck  . BREAST LUMPECTOMY Right    snbx, apbi  . Cataract removal OD  06/28/2012   . Cataract removal OS  12/13/2012  . dermatolfibroma  2012  . DILATION AND CURETTAGE OF UTERUS    . ENTEROCELE REPAIR    . HAIR TRANSPLANT  2008  . HERNIA REPAIR    . IR FLUORO GUIDE CV LINE RIGHT  07/19/2017  . IR PATIENT EVAL TECH 0-60 MINS  07/29/2017  . IR [place] VASC ACCESS RIGHT  07/19/2017  . OOPHORECTOMY    . strabisumus eye surgery     x 2  . TONSILLECTOMY     Family History  Problem Relation Age of Onset  . Hypertension Mother   . Stroke Mother   . Alzheimer's disease Father   .  Hypertension Brother   . Breast cancer Maternal Aunt   . Gallbladder disease Brother   . Colon cancer Neg Hx    Social History   Socioeconomic History  . Marital status: Single    Spouse name: Not on file  . Number of children: 0  . Years of education: 8  . Highest education level: Not on file  Occupational History  . Occupation: retired Economist  Social Needs  . Financial resource strain: Not hard at all  . Food insecurity:    Worry: Never true    Inability: Never true  . Transportation needs:    Medical: No    Non-medical: No  Tobacco Use  . Smoking status: Never Smoker  . Smokeless tobacco: Never Used  Substance and Sexual Activity  . Alcohol use: No  . Drug use: No  . Sexual activity: Never  Lifestyle  . Physical activity:     Days per week: 0 days    Minutes per session: 0 min  . Stress: Only a little  Relationships  . Social connections:    Talks on phone: Twice a week    Gets together: Once a week    Attends religious service: Never    Active member of club or organization: Yes    Attends meetings of clubs or organizations: More than 4 times per year    Relationship status: Never married  Other Topics Concern  . Not on file  Social History Narrative   Lives alone in a 2 story home.  Has 2 dogs, no children.     Retired Economist.     Education: Oceanographer.      Outpatient Encounter Medications as of 11/09/2018  Medication Sig  . acetaminophen (TYLENOL) 500 MG tablet Take 500 mg by mouth every 6 (six) hours as needed for mild pain. Patient states every 8 hours as well  . CALCIUM-VITAMIN D PO Take 1 capsule by mouth daily.  . diphenhydrAMINE (BENADRYL) 25 MG tablet Take 12.5-25 mg by mouth at bedtime as needed for sleep.   . flutamide (EULEXIN) 125 MG capsule Take 125 mg by mouth two times a day  . hyoscyamine (LEVSIN SL) 0.125 MG SL tablet DISSOLVE 1 TABLET UNDER TONGUE EVERY 4-6 HOURS AS NEEDED FOR DIARRHEA  . losartan (COZAAR) 100 MG tablet TAKE 1 TABLET BY MOUTH EVERY DAY  . Melatonin 3 MG TABS Take 1 tablet by mouth as needed. Alternate taking between this and benadryl  . minoxidil (LONITEN) 2.5 MG tablet 1.20m by mouth daily  . mirabegron ER (MYRBETRIQ) 25 MG TB24 tablet Take 1 tablet (25 mg total) by mouth daily.  . Multiple Vitamins-Minerals (MULTIVITAMIN & MINERAL PO) Take 1 tablet by mouth daily.  . nebivolol (BYSTOLIC) 5 MG tablet Take 1 tablet (5 mg total) by mouth daily.  .Marland KitchenNeomycin-Bacitracin-Polymyxin (HCA TRIPLE ANTIBIOTIC OINTMENT EX) Apply 1 application topically daily as needed (irritation to nares).  . pantoprazole (PROTONIX) 40 MG tablet Take 1 tablet (40 mg total) by mouth every other day.  . Probiotic Product (PROBIOTIC PO) Take 1 tablet by mouth daily.  . Psyllium (CVS  DAILY FIBER PO) Patient takes 1 tablespoon of powder by mouth daily  . vitamin C (ASCORBIC ACID) 500 MG tablet Take 500 mg by mouth daily.  . [DISCONTINUED] Wheat Dextrin-Calcium (CVS EASY FIBER/CALCIUM PO) Take 1 tablet by mouth daily.    No facility-administered encounter medications on file as of 11/09/2018.     Activities of Daily Living  In your present state of health, do you have any difficulty performing the following activities: 11/09/2018  Hearing? N  Vision? N  Difficulty concentrating or making decisions? Y  Comment remembering   Walking or climbing stairs? N  Dressing or bathing? N  Doing errands, shopping? N  Preparing Food and eating ? N  Using the Toilet? N  In the past six months, have you accidently leaked urine? Y  Do you have problems with loss of bowel control? N  Managing your Medications? N  Managing your Finances? N  Housekeeping or managing your Housekeeping? N  Some recent data might be hidden    Patient Care Team: Gayland Curry, DO as PCP - General (Geriatric Medicine) Loney Loh, MD (Dermatology) Lafayette Dragon, MD (Inactive) as Consulting Physician (Gastroenterology) Magrinat, Virgie Dad, MD as Consulting Physician (Oncology) Shon Hough, MD as Consulting Physician (Ophthalmology) Leta Baptist, MD as Consulting Physician (Otolaryngology) Rolm Bookbinder, MD as Consulting Physician (General Surgery) Irene Shipper, MD as Consulting Physician (Gastroenterology) Blima Rich, MD as Referring Physician (Obstetrics and Gynecology)    Assessment:   This is a routine wellness examination for Gorgeous.  Exercise Activities and Dietary recommendations Current Exercise Habits: The patient does not participate in regular exercise at present, Exercise limited by: None identified  Goals    . patient stated     Starting 09/28/16 I will find and start doing tai chi 1-2 times a week.     . Patient Stated     Patient would like to walk 3 days a week        Fall Risk Fall Risk  11/09/2018 11/09/2018 08/22/2018 12/16/2017 10/11/2017  Falls in the past year? 0 0 0 No Yes  Number falls in past yr: 0 0 0 - 1  Injury with Fall? 0 0 0 - No  Risk for fall due to : - - - - -  Follow up - - - - -   Is the patient's home free of loose throw rugs in walkways, pet beds, electrical cords, etc?   yes      Grab bars in the bathroom? No grabs on the sink       Handrails on the stairs?   yes      Adequate lighting?   yes  Depression Screen PHQ 2/9 Scores 11/09/2018 08/22/2018 10/11/2017 04/12/2017  PHQ - 2 Score 0 0 0 0     Cognitive Function MMSE - Mini Mental State Exam 10/11/2017 09/28/2016 09/10/2015 04/24/2014  Orientation to time _0 Orientation to Place _1 Registration _2 Attention/ Calculation _3 Recall _4 Language- name 2 objects _5 Language- repeat _6 Language- follow 3 step command _7 Language- read & follow direction _8 Write a sentence _9 Copy design _10 Total score _11 6CIT Screen 11/09/2018  What Year? 0 points  What month? 0 points  What time? 0 points  Count back from 20 0 points  Months in reverse 0 points  Repeat phrase 0 points  Total Score 0    Immunization History  Administered Date(s) Administered  . DTaP 06/08/1996  . Hepatitis A 10/18/1996, 11/15/1997  . Influenza, High Dose Seasonal PF 03/13/2017, 03/29/2018  . Influenza,inj,Quad PF,6+  Mos 03/01/2013, 03/02/2014, 03/05/2015, 04/14/2016  . Influenza-Unspecified 04/07/2016  . Pneumococcal Conjugate-13 09/10/2015  . Pneumococcal-Unspecified 05/07/2010  . Td 09/04/1981, 02/11/1993, 12/07/2011  . Tdap 12/07/2011  . Yellow Fever 11/08/1987  . Zoster 11/30/2006  . Zoster Recombinat (Shingrix) 06/09/2017    Qualifies for Shingles Vaccine? Up to date   Screening Tests Health Maintenance  Topic Date Due  . MAMMOGRAM  10/27/2018  . INFLUENZA VACCINE  01/07/2019  . TETANUS/TDAP  12/06/2021  .  DEXA SCAN  Completed  . PNA vac Low Risk Adult  Completed    Cancer Screenings: Lung: Low Dose CT Chest recommended if Age 49-80 years, 30 pack-year currently smoking OR have quit w/in 15years. Patient does not qualify. Breast:  Up to date on Mammogram? No  Due this year would like to postponed until COVID-19  Up to date of Bone Density/Dexa? Yes Colorectal:Up to date  Additional Screenings: Hepatitis C Screening: Low Risk   Plan:  - will need a mammogram but would like to wait until COVID-19  - Advance Directives information mailed this visit   I have personally reviewed and noted the following in the patient's chart:   . Medical and social history . Use of alcohol, tobacco or illicit drugs  . Current medications and supplements . Functional ability and status . Nutritional status . Physical activity . Advanced directives . List of other physicians . Hospitalizations, surgeries, and ER visits in previous 12 months . Vitals . Screenings to include cognitive, depression, and falls . Referrals and appointments  In addition, I have reviewed and discussed with patient certain preventive protocols, quality metrics, and best practice recommendations. A written personalized care plan for preventive services as well as general preventive health recommendations were provided to patient.  Sandrea Hughs, NP  11/09/2018

## 2018-11-09 NOTE — Progress Notes (Signed)
   This service is provided via telemedicine  No vital signs collected/recorded due to the encounter was a telemedicine visit.   Location of patient (ex: home, work):  Home   Patient consents to a telephone visit:  Yes   Location of the provider (ex: office, home): Office   Name of any referring provider:  Dr. Hollace Kinnier   Names of all persons participating in the telemedicine service and their role in the encounter:  Ruthell Rummage CMA, Dinah Ngetich NP, and Jeanella Anton   Time spent on call: Ruthell Rummage CMA spent  29 Minutes on phone with patient

## 2018-11-30 ENCOUNTER — Ambulatory Visit: Payer: Self-pay | Admitting: Family

## 2018-12-01 ENCOUNTER — Ambulatory Visit: Payer: Medicare Other | Admitting: Family

## 2018-12-21 ENCOUNTER — Other Ambulatory Visit: Payer: Self-pay | Admitting: Internal Medicine

## 2018-12-27 ENCOUNTER — Other Ambulatory Visit: Payer: Self-pay

## 2018-12-27 ENCOUNTER — Other Ambulatory Visit: Payer: Medicare Other

## 2018-12-27 DIAGNOSIS — I10 Essential (primary) hypertension: Secondary | ICD-10-CM

## 2018-12-27 DIAGNOSIS — E785 Hyperlipidemia, unspecified: Secondary | ICD-10-CM

## 2018-12-28 LAB — COMPLETE METABOLIC PANEL WITH GFR
AG Ratio: 1.9 (calc) (ref 1.0–2.5)
ALT: 16 U/L (ref 6–29)
AST: 17 U/L (ref 10–35)
Albumin: 4.4 g/dL (ref 3.6–5.1)
Alkaline phosphatase (APISO): 90 U/L (ref 37–153)
BUN: 18 mg/dL (ref 7–25)
CO2: 29 mmol/L (ref 20–32)
Calcium: 10 mg/dL (ref 8.6–10.4)
Chloride: 102 mmol/L (ref 98–110)
Creat: 0.87 mg/dL (ref 0.60–0.93)
GFR, Est African American: 74 mL/min/{1.73_m2} (ref >=60)
GFR, Est Non African American: 64 mL/min/{1.73_m2} (ref >=60)
Globulin: 2.3 g/dL (calc) (ref 1.9–3.7)
Glucose, Bld: 97 mg/dL (ref 65–99)
Potassium: 4.7 mmol/L (ref 3.5–5.3)
Sodium: 140 mmol/L (ref 135–146)
Total Bilirubin: 0.6 mg/dL (ref 0.2–1.2)
Total Protein: 6.7 g/dL (ref 6.1–8.1)

## 2018-12-28 LAB — TSH: TSH: 3.47 mIU/L (ref 0.40–4.50)

## 2018-12-28 LAB — CBC WITH DIFFERENTIAL/PLATELET
Absolute Monocytes: 398 cells/uL (ref 200–950)
Basophils Absolute: 48 cells/uL (ref 0–200)
Basophils Relative: 1 %
Eosinophils Absolute: 101 cells/uL (ref 15–500)
Eosinophils Relative: 2.1 %
HCT: 42 % (ref 35.0–45.0)
Hemoglobin: 13.5 g/dL (ref 11.7–15.5)
Lymphs Abs: 1133 cells/uL (ref 850–3900)
MCH: 31.3 pg (ref 27.0–33.0)
MCHC: 32.1 g/dL (ref 32.0–36.0)
MCV: 97.4 fL (ref 80.0–100.0)
MPV: 11 fL (ref 7.5–12.5)
Monocytes Relative: 8.3 %
Neutro Abs: 3120 cells/uL (ref 1500–7800)
Neutrophils Relative %: 65 %
Platelets: 300 10*3/uL (ref 140–400)
RBC: 4.31 10*6/uL (ref 3.80–5.10)
RDW: 11.2 % (ref 11.0–15.0)
Total Lymphocyte: 23.6 %
WBC: 4.8 10*3/uL (ref 3.8–10.8)

## 2018-12-28 LAB — LIPID PANEL
Cholesterol: 187 mg/dL (ref <200)
HDL: 42 mg/dL — ABNORMAL LOW (ref >=50)
LDL Cholesterol (Calc): 123 mg/dL (calc) — ABNORMAL HIGH
Non-HDL Cholesterol (Calc): 145 mg/dL (calc) — ABNORMAL HIGH (ref <130)
Total CHOL/HDL Ratio: 4.5 (calc) (ref <5.0)
Triglycerides: 117 mg/dL (ref <150)

## 2018-12-29 ENCOUNTER — Encounter: Payer: Self-pay | Admitting: Internal Medicine

## 2018-12-29 ENCOUNTER — Ambulatory Visit (INDEPENDENT_AMBULATORY_CARE_PROVIDER_SITE_OTHER): Payer: Medicare Other | Admitting: Internal Medicine

## 2018-12-29 ENCOUNTER — Other Ambulatory Visit: Payer: Self-pay

## 2018-12-29 VITALS — BP 128/62 | HR 55 | Temp 98.5°F | Ht 64.0 in | Wt 157.0 lb

## 2018-12-29 DIAGNOSIS — G7241 Inclusion body myositis [IBM]: Secondary | ICD-10-CM | POA: Diagnosis not present

## 2018-12-29 DIAGNOSIS — M8589 Other specified disorders of bone density and structure, multiple sites: Secondary | ICD-10-CM

## 2018-12-29 DIAGNOSIS — R29898 Other symptoms and signs involving the musculoskeletal system: Secondary | ICD-10-CM

## 2018-12-29 DIAGNOSIS — N3281 Overactive bladder: Secondary | ICD-10-CM | POA: Diagnosis not present

## 2018-12-29 DIAGNOSIS — I872 Venous insufficiency (chronic) (peripheral): Secondary | ICD-10-CM

## 2018-12-29 DIAGNOSIS — E785 Hyperlipidemia, unspecified: Secondary | ICD-10-CM

## 2018-12-29 NOTE — Patient Instructions (Signed)
Do a short trial off myrbetriq to see if it's helping you (3 days). Elevate your feet above the heart at rest.  Get some compression hose to help the swelling.  Try low sodium lunch meats.

## 2018-12-29 NOTE — Progress Notes (Signed)
Location:  [facility] [facility]   Advanced Directives 11/09/2018  Does Patient Have a Medical Advance Directive? No  Type of Advance Directive -  Does patient want to make changes to medical advance directive? -  Copy of [place] in Chart? -  Would patient like information on creating a medical advance directive? -     Chief Complaint  Patient presents with  . Medical Management of Chronic Issues    63mth follow-up    HPI: Patient is a 78 y.o. female seen today for medical management of chronic diseases.    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 She does not have any new complaints. She is taking tylenol every 6 hours and uses capscasin cream on her upper arms for pain. No new episodes of weakness. She had been advised by [facility] to quarantine herself. Exercise has been an issue with quarantine. She has not been able to attend water therapy. She will not do the exercises that were recommended last visit. She states she has no motivation to do them.   She is currently taking Myrbetriq for bladder leakage and stress incontinence. Due to cost and lack of urinary incontinence episodes, she would like to try stopping the medication.   She is eating three meals a day. These meals are high in carbs, dairy and processed deli meats. She hydrates often throughout the day.           Past Medical History:  Diagnosis Date  . Alopecia, unspecified   . Breast cancer ([place])    stage 1; right  . Cataracts, bilateral   . Decreased rectal sphincter tone 07/01/12  . Diverticulosis   . Enterocele   . Esophageal stricture 07/01/12  . Hernia   . Hiatal hernia   . Hypertension   . Irritable bowel syndrome   . Lumbago   . Migraine with aura, without mention of intractable migraine without mention of status migrainosus   . Mixed hearing loss, bilateral   . Obsessive-compulsive personality disorder ([place])   . Osteoarthrosis, unspecified whether generalized or localized,  unspecified site   . Other and unspecified hyperlipidemia   . Plantar fascial fibromatosis   . Rectal prolapse   . Reflux esophagitis   . Regional enteritis of small intestine ([place])   . Senile osteoporosis   . Spasm of muscle   . Unspecified constipation   . Unspecified hypothyroidism     Past Surgical History:  Procedure Laterality Date  . APPENDECTOMY    . BASAL CELL CARCINOMA EXCISION  1978   neck  . BREAST LUMPECTOMY Right    snbx, apbi  . Cataract removal OD  06/28/2012   . Cataract removal OS  12/13/2012  . dermatolfibroma  2012  . DILATION AND CURETTAGE OF UTERUS    . ENTEROCELE REPAIR    . HAIR TRANSPLANT  2008  . HERNIA REPAIR    . IR FLUORO GUIDE CV LINE RIGHT  07/19/2017  . IR PATIENT EVAL TECH 0-60 MINS  07/29/2017  . IR US GUIDE VASC ACCESS RIGHT  07/19/2017  . OOPHORECTOMY    . strabisumus eye surgery     x 2  . TONSILLECTOMY      Allergies  Allergen Reactions  . Gabapentin Other (See Comments)    Balance disturbance  . Cetacaine [Butamben-Tetracaine-Benzocaine] Other (See Comments)    LOST HER SENSE OF TASTE FOR OVER A MONTH!!!    Outpatient Encounter Medications as of 12/29/2018  Medication Sig  .  acetaminophen (TYLENOL) 500 MG tablet Take 500 mg by mouth every 6 (six) hours as needed for mild pain. Patient states every 8 hours as well  . CALCIUM-VITAMIN D PO Take 1 capsule by mouth daily.  . diphenhydrAMINE (BENADRYL) 25 MG tablet Take 12.5-25 mg by mouth at bedtime as needed for sleep.   . flutamide (EULEXIN) 125 MG capsule Take 125 mg by mouth two times a day  . hyoscyamine (LEVSIN SL) 0.125 MG SL tablet DISSOLVE 1 TABLET UNDER TONGUE EVERY 4-6 HOURS AS NEEDED FOR DIARRHEA  . losartan (COZAAR) 100 MG tablet TAKE 1 TABLET BY MOUTH EVERY DAY  . Melatonin 3 MG TABS Take 1 tablet by mouth as needed. Alternate taking between this and benadryl  . minoxidil (LONITEN) 2.5 MG tablet 1.25mg  by mouth daily  . Multiple Vitamins-Minerals (MULTIVITAMIN & MINERAL  PO) Take 1 tablet by mouth daily.  [name] MYRBETRIQ 25 MG TB24 tablet TAKE 1 TABLET BY MOUTH EVERY DAY  . nebivolol (BYSTOLIC) 5 MG tablet Take 1 tablet (5 mg total) by mouth daily.  [name] Neomycin-Bacitracin-Polymyxin (HCA TRIPLE ANTIBIOTIC OINTMENT EX) Apply 1 application topically daily as needed (irritation to nares).  . pantoprazole (PROTONIX) 40 MG tablet Take 1 tablet (40 mg total) by mouth every other day.  . Probiotic Product (PROBIOTIC PO) Take 1 tablet by mouth daily.  . Psyllium (CVS DAILY FIBER PO) Patient takes 1 tablespoon of powder by mouth daily  . vitamin C (ASCORBIC ACID) 500 MG tablet Take 500 mg by mouth daily.   No facility-administered encounter medications on file as of 12/29/2018.     Review of Systems:  Review of Systems  Constitutional: Positive for activity change. Negative for appetite change, fatigue and unexpected weight change.  HENT: Negative for dental problem, hearing loss and trouble swallowing.   Eyes: Negative for photophobia and visual disturbance.  Respiratory: Negative for cough, shortness of breath and wheezing.   Cardiovascular: Positive for leg swelling. Negative for chest pain and palpitations.  Gastrointestinal: Negative for constipation, diarrhea and nausea.  Endocrine: Negative for polydipsia, polyphagia and polyuria.  Genitourinary: Positive for frequency and urgency. Negative for dysuria and vaginal discharge.  Musculoskeletal: Positive for arthralgias, myalgias and neck pain.  Neurological: Negative for dizziness, weakness, numbness and headaches.  Psychiatric/Behavioral: Negative for dysphoric mood. The patient is not nervous/anxious.     Health Maintenance  Topic Date Due  . MAMMOGRAM  10/27/2018  . INFLUENZA VACCINE  01/07/2019  . TETANUS/TDAP  12/06/2021  . DEXA SCAN  Completed  . PNA vac Low Risk Adult  Completed    Physical Exam: Vitals:   12/29/18 1510  BP: 128/62  Pulse: (!) 55  Temp: 98.5 F (36.9 C)  TempSrc: Oral  SpO2:  97%  Weight: 157 lb (71.2 kg)  Height: 5\' 4"  (1.626 m)   Body mass index is 26.95 kg/m. Physical Exam Vitals signs reviewed.  Constitutional:      General: She is not in acute distress.    Appearance: Normal appearance. She is normal weight. She is not ill-appearing.  HENT:     Head: Normocephalic.     Mouth/Throat:     Mouth: Mucous membranes are dry.  Neck:     Musculoskeletal: Normal range of motion. Muscular tenderness present.  Cardiovascular:     Rate and Rhythm: Normal rate and regular rhythm.     Pulses: Normal pulses.     Heart sounds: Normal heart sounds. No murmur.  Pulmonary:     Effort: Pulmonary effort  is normal. No respiratory distress.     Breath sounds: Normal breath sounds. No wheezing.  Abdominal:     General: Abdomen is flat.     Palpations: Abdomen is soft.     Tenderness: There is no abdominal tenderness.  Musculoskeletal: Normal range of motion.     Right lower leg: Edema present.     Left lower leg: Edema present.     Comments: Kyphosis noted.  Pain with bilateral shoulder abduction and flexion.  Skin:    General: Skin is warm and dry.     Capillary Refill: Capillary refill takes 2 to 3 seconds.     Comments: Dry, flaking skin noted on BLE. No open wounds, ulcers or lesions present  Neurological:     General: No focal deficit present.     Mental Status: She is alert and oriented to person, place, and time. Mental status is at baseline.  Psychiatric:        Mood and Affect: Mood normal.        Behavior: Behavior normal.        Thought Content: Thought content normal.        Judgment: Judgment normal.     Labs reviewed: Basic Metabolic Panel: Recent Labs    04/18/18 0906 12/27/18 0858  NA 140 140  K 4.7 4.7  CL 103 102  CO2 29 29  GLUCOSE 94 97  BUN 18 18  CREATININE 0.77 0.87  CALCIUM 9.7 10.0  TSH  --  3.47   Liver Function Tests: Recent Labs    04/18/18 0906 12/27/18 0858  AST 16 17  ALT 12 16  BILITOT 0.5 0.6  PROT 6.7  6.7   No results for input(s): LIPASE, AMYLASE in the last 8760 hours. No results for input(s): AMMONIA in the last 8760 hours. CBC: Recent Labs    04/18/18 0906 12/27/18 0858  WBC 4.8 4.8  NEUTROABS 3,010 3,120  HGB 13.6 13.5  HCT 40.5 42.0  MCV 92.3 97.4  PLT 323 300   Lipid Panel: Recent Labs    04/18/18 0906 12/27/18 0858  CHOL 187 187  HDL 46* 42*  LDLCALC 122* 123*  TRIG 91 117  CHOLHDL 4.1 4.5   Lab Results  Component Value Date   HGBA1C 5.7 07/06/2017    Procedures since last visit: No results found.  Assessment/Plan  1. Inclusion body myositis (IBM) - followed by Dr. Donn Pierini, advised to ask about exercising and lifting limitations -continue tylenol and capsaicin for pain  2. Dropped head syndrome - stable, no progression - recommend swimming when quarantine is lifted  3. Essential hypertension - bp well controlled with medications - continue same medication regimen - reduce processed deli meats and follow a low sodium diet - elevate ankles above the heart and consider wearing compression stockings if diet changes do not reduce ankle edema -CBC with differential/Platelets- future -CMP with GFR- future  4. Urinary incontinence, mixed - no progression of bladder leakage - advised to trial stopping Myrbetriq for 3 days to see if symptoms do not progress, continue medication if symptoms worsen - avoid liquids that may irritate bladder, coffee/caffeinated drinks  5. Hyperlipidemia - advised to refrain from foods high in fats, oil, and carbs -encourage light exercise like walking - lipid panel-future   Labs/tests ordered: CBC with differential/Platelets, CMP with GFR,fasting lipid panel next visit Next appt:  4 month follow up

## 2019-01-01 ENCOUNTER — Other Ambulatory Visit: Payer: Self-pay | Admitting: Internal Medicine

## 2019-01-01 DIAGNOSIS — I10 Essential (primary) hypertension: Secondary | ICD-10-CM

## 2019-03-15 ENCOUNTER — Ambulatory Visit (INDEPENDENT_AMBULATORY_CARE_PROVIDER_SITE_OTHER): Payer: Medicare Other | Admitting: Family

## 2019-03-15 ENCOUNTER — Encounter: Payer: Self-pay | Admitting: Family

## 2019-03-15 ENCOUNTER — Other Ambulatory Visit: Payer: Self-pay

## 2019-03-15 DIAGNOSIS — G4700 Insomnia, unspecified: Secondary | ICD-10-CM

## 2019-03-15 NOTE — Progress Notes (Signed)
This service is provided via telemedicine  No vital signs collected/recorded due to the encounter was a telemedicine visit.   Location of patient (ex: home, work):  Home   Patient consents to a telephone visit:  Yes   Location of the provider (ex: office, home):  Office   Name of any referring provider:  Hollace Kinnier, [place] of all persons participating in the telemedicine service and their role in the encounter: Marlowe Sax, NP, Ruthell Rummage CMA, Jeanella Anton   Time spent on call:  Ruthell Rummage CMA spent 15 minutes on phone with patient.     [facility]  Provider: Marlowe Sax, NP   Code Status: FULL Goals of Care:  Advanced Directives 11/09/2018  Does Patient Have a Medical Advance Directive? No  Type of Advance Directive -  Does patient want to make changes to medical advance directive? -  Copy of [place] in Chart? -  Would patient like information on creating a medical advance directive? -     Chief Complaint  Patient presents with  . Acute Visit    Insomnia patient states duration patient states she needs to move and is having anxiety. Patient states she has been lying awake at night     HPI: Patient is a 78 y.o. female seen today for an acute visit for Insomnia x 2-3 days.she states unable to fall asleep at night.she states in the process of trying to move to [place] to be close to family.she states it's about time cannot live alone too far from family but the thought of packing and moving is making her anxious.she does not think she is anxious or depressed " Just the worry of moving".she has lived in her current house since 49.she denies being anxious or depressed to need treatment but notify provider " I didn't think that it can make me anxious or depressed" .she denies any fever,chills,cough or signs of urinary tract infections.she has melatonin 3 mg tablet as needed though states has not taken it.she usually take half a pill.she  states will try taking a whole pill to see if it will help with her sleeping.  Past Medical History:  Diagnosis Date  . Alopecia, unspecified   . Breast cancer ([place])    stage 1; right  . Cataracts, bilateral   . Decreased rectal sphincter tone 07/01/12  . Diverticulosis   . Enterocele   . Esophageal stricture 07/01/12  . Hernia   . Hiatal hernia   . Hypertension   . Irritable bowel syndrome   . Lumbago   . Migraine with aura, without mention of intractable migraine without mention of status migrainosus   . Mixed hearing loss, bilateral   . 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 Osteoarthrosis, unspecified whether generalized or localized, unspecified site   . Other and unspecified hyperlipidemia   . Plantar fascial fibromatosis   . Rectal prolapse   . Reflux esophagitis   . Regional enteritis of small intestine ([place])   . Senile osteoporosis   . Spasm of muscle   . Unspecified constipation   . Unspecified hypothyroidism     Past Surgical History:  Procedure Laterality Date  . APPENDECTOMY    . BASAL CELL CARCINOMA EXCISION  1978   neck  . BREAST LUMPECTOMY Right    snbx, apbi  . Cataract removal OD  06/28/2012   . Cataract removal OS  12/13/2012  . dermatolfibroma  2012  . DILATION AND CURETTAGE OF  UTERUS    . ENTEROCELE REPAIR    . HAIR TRANSPLANT  2008  . HERNIA REPAIR    . IR FLUORO GUIDE CV LINE RIGHT  07/19/2017  . IR PATIENT EVAL TECH 0-60 MINS  07/29/2017  . IR US GUIDE VASC ACCESS RIGHT  07/19/2017  . OOPHORECTOMY    . strabisumus eye surgery     x 2  . TONSILLECTOMY      Allergies  Allergen Reactions  . Gabapentin Other (See Comments)    Balance disturbance  . Cetacaine [Butamben-Tetracaine-Benzocaine] Other (See Comments)    LOST HER SENSE OF TASTE FOR OVER A MONTH!!!    Outpatient Encounter Medications as of 03/15/2019  Medication Sig  . acetaminophen (TYLENOL) 500 MG tablet Take 500 mg by mouth every 6 (six) hours as needed for mild  pain. Patient states every 8 hours as well  . CALCIUM-VITAMIN D PO Take 1 capsule by mouth daily.  . diphenhydrAMINE (BENADRYL) 25 MG tablet Take 12.5-25 mg by mouth at bedtime as needed for sleep.   . flutamide (EULEXIN) 125 MG capsule Take 125 mg by mouth two times a day  . hyoscyamine (LEVSIN SL) 0.125 MG SL tablet DISSOLVE 1 TABLET UNDER TONGUE EVERY 4-6 HOURS AS NEEDED FOR DIARRHEA  . losartan (COZAAR) 100 MG tablet TAKE 1 TABLET BY MOUTH EVERY DAY  . Melatonin 3 MG TABS Take 1 tablet by mouth as needed. Alternate taking between this and benadryl  . minoxidil (LONITEN) 2.5 MG tablet 1.25mg  by mouth daily  . Multiple Vitamins-Minerals (MULTIVITAMIN & MINERAL PO) Take 1 tablet by mouth daily.  [name] MYRBETRIQ 25 MG TB24 tablet TAKE 1 TABLET BY MOUTH EVERY DAY  . nebivolol (BYSTOLIC) 5 MG tablet Take 1 tablet (5 mg total) by mouth daily.  [name] Neomycin-Bacitracin-Polymyxin (HCA TRIPLE ANTIBIOTIC OINTMENT EX) Apply 1 application topically daily as needed (irritation to nares).  . pantoprazole (PROTONIX) 40 MG tablet Take 1 tablet (40 mg total) by mouth every other day.  . Probiotic Product (PROBIOTIC PO) Take 1 tablet by mouth daily.  . Psyllium ([employer] DAILY FIBER PO) Patient takes 1 tablespoon of powder by mouth daily  . vitamin C (ASCORBIC ACID) 500 MG tablet Take 500 mg by mouth daily.   No facility-administered encounter medications on file as of 03/15/2019.     Review of Systems:  Review of Systems  Constitutional: Negative for appetite change, chills, fatigue and fever.  HENT: Negative for congestion, rhinorrhea, sinus pressure, sinus pain, sneezing and sore throat.   Eyes: Negative for discharge, redness and itching.  Respiratory: Negative for cough, chest tightness, shortness of breath and wheezing.   Cardiovascular: Negative for chest pain, palpitations and leg swelling.  Gastrointestinal: Negative for abdominal distention, abdominal pain, constipation, diarrhea, nausea and vomiting.   Genitourinary: Negative for difficulty urinating, dysuria, flank pain, frequency and urgency.  Neurological: Negative for dizziness, light-headedness and headaches.  Psychiatric/Behavioral: Positive for sleep disturbance. Negative for agitation and confusion.       Worried about moving per HPI     Health Maintenance  Topic Date Due  . MAMMOGRAM  10/27/2018  . INFLUENZA VACCINE  01/07/2019  . TETANUS/TDAP  12/06/2021  . DEXA SCAN  Completed  . PNA vac Low Risk Adult  Completed    Physical Exam: There were no vitals filed for this visit. There is no height or weight on file to calculate BMI. Physical Exam  Unable to complete on Telephone visit.  Labs reviewed: Basic Metabolic Panel: Recent Labs  04/18/18 0906 12/27/18 0858  NA 140 140  K 4.7 4.7  CL 103 102  CO2 29 29  GLUCOSE 94 97  BUN 18 18  CREATININE 0.77 0.87  CALCIUM 9.7 10.0  TSH  --  3.47   Liver Function Tests: Recent Labs    04/18/18 0906 12/27/18 0858  AST 16 17  ALT 12 16  BILITOT 0.5 0.6  PROT 6.7 6.7   CBC: Recent Labs    04/18/18 0906 12/27/18 0858  WBC 4.8 4.8  NEUTROABS 3,010 3,120  HGB 13.6 13.5  HCT 40.5 42.0  MCV 92.3 97.4  PLT 323 300   Lipid Panel: Recent Labs    04/18/18 0906 12/27/18 0858  CHOL 187 187  HDL 46* 42*  LDLCALC 122* 123*  TRIG 91 117  CHOLHDL 4.1 4.5   Lab Results  Component Value Date   HGBA1C 5.7 07/06/2017    Procedures since last visit: No results found.  Assessment/Plan   Insomnia, unspecified type Has melatonin but has been taking 1/2 tablet at bedtime.Advised to take Melatonin 3 - 6 mg tablet one by mouth daily at bedtime.Notify provider's office if still unable to sleep.patient will also notify provider if for any symptoms of anxiety or depression given her plans to move to [place] after living in her house since 1982.   Labs/tests ordered: None  Next appt:  4 month follow up on 05/01/2019 with Dr. Mariea Clonts.  Spent 11 minutes of non-face  to face with patient

## 2019-03-16 ENCOUNTER — Other Ambulatory Visit: Payer: Self-pay

## 2019-03-16 ENCOUNTER — Encounter: Payer: Self-pay | Admitting: Adult Health

## 2019-03-16 ENCOUNTER — Ambulatory Visit (INDEPENDENT_AMBULATORY_CARE_PROVIDER_SITE_OTHER): Payer: Medicare Other | Admitting: Adult Health

## 2019-03-16 DIAGNOSIS — S31819A Unspecified open wound of right buttock, initial encounter: Secondary | ICD-10-CM

## 2019-03-16 DIAGNOSIS — S31809A Unspecified open wound of unspecified buttock, initial encounter: Secondary | ICD-10-CM | POA: Insufficient documentation

## 2019-03-16 LAB — HM MAMMOGRAPHY

## 2019-03-16 NOTE — Patient Instructions (Signed)
Wound of right buttock, initial encounter - Clean right buttock wound with gauze that is wet with saline solution, apply Triple antibiotic and Pain Relief ointment daily till healed  - Allow at least 20 mins for the triple antibiotic to be absorbed before sitting on right buttock.  - Keep area clean

## 2019-03-16 NOTE — Progress Notes (Signed)
[facility]  Provider:  Vella Colquitt C. Medina-Vargas - NP  Code Status:   Goals of Care:  Advanced Directives 11/09/2018  Does Patient Have a Medical Advance Directive? No  Type of Advance Directive -  Does patient want to make changes to medical advance directive? -  Copy of [place] in Chart? -  Would patient like information on creating a medical advance directive? -     Chief Complaint  Patient presents with  . Acute Visit    C/o- sore on buttock (R) side, mild pain for sitting on it     HPI: Patient is a 78 y.o. female seen today for an acute visit for a sore on her buttock. She thinks it is from he pantyliner. This problem has been on and off for several years. She uses pantyliner for occasional bladder incontinence. She  She prefers to wear jeans all day and sleeps on a recliner. Right buttock wound is approximately 0.5 X 0.2 cm, wet with serous drainage, no erythema nor induration.  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 She plans to move to [place], [place] were her family is. She has an online nationwide support group for people with these conditions. She plans to go back to swimming when COVID-19 pandemic restrictions are lifted.   Past Medical History:  Diagnosis Date  . Alopecia, unspecified   . Breast cancer ([place])    stage 1; right  . Cataracts, bilateral   . Decreased rectal sphincter tone 07/01/12  . Diverticulosis   . Enterocele   . Esophageal stricture 07/01/12  . Hernia   . Hiatal hernia   . Hypertension   . Irritable bowel syndrome   . Lumbago   . Migraine with aura, without mention of intractable migraine without mention of status migrainosus   . Mixed hearing loss, bilateral   . 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 Osteoarthrosis, unspecified whether generalized or localized, unspecified site   . Other and unspecified hyperlipidemia   . Plantar fascial fibromatosis   . Rectal  prolapse   . Reflux esophagitis   . Regional enteritis of small intestine ([place])   . Senile osteoporosis   . Spasm of muscle   . Unspecified constipation   . Unspecified hypothyroidism     Past Surgical History:  Procedure Laterality Date  . APPENDECTOMY    . BASAL CELL CARCINOMA EXCISION  1978   neck  . BREAST LUMPECTOMY Right    snbx, apbi  . Cataract removal OD  06/28/2012   . Cataract removal OS  12/13/2012  . dermatolfibroma  2012  . DILATION AND CURETTAGE OF UTERUS    . ENTEROCELE REPAIR    . HAIR TRANSPLANT  2008  . HERNIA REPAIR    . IR FLUORO GUIDE CV LINE RIGHT  07/19/2017  . IR PATIENT EVAL TECH 0-60 MINS  07/29/2017  . IR US GUIDE VASC ACCESS RIGHT  07/19/2017  . OOPHORECTOMY    . strabisumus eye surgery     x 2  . TONSILLECTOMY      Allergies  Allergen Reactions  . Gabapentin Other (See Comments)    Balance disturbance  . Cetacaine [Butamben-Tetracaine-Benzocaine] Other (See Comments)    LOST HER SENSE OF TASTE FOR OVER A MONTH!!!    Outpatient Encounter Medications as of 03/16/2019  Medication Sig  . acetaminophen (TYLENOL) 500 MG tablet Take 500 mg by mouth every 6 (six) hours as needed for mild pain. Patient states  every 8 hours as well  . CALCIUM-VITAMIN D PO Take 1 capsule by mouth daily.  . diphenhydrAMINE (BENADRYL) 25 MG tablet Take 12.5-25 mg by mouth at bedtime as needed for sleep.   [name] losartan (COZAAR) 100 MG tablet TAKE 1 TABLET BY MOUTH EVERY DAY  . Melatonin 3 MG TABS Take 1 tablet by mouth as needed. Alternate taking between this and benadryl  . minoxidil (LONITEN) 2.5 MG tablet 1.25mg  by mouth daily  . Multiple Vitamins-Minerals (MULTIVITAMIN & MINERAL PO) Take 1 tablet by mouth daily.  [name] MYRBETRIQ 25 MG TB24 tablet TAKE 1 TABLET BY MOUTH EVERY DAY  . nebivolol (BYSTOLIC) 5 MG tablet Take 1 tablet (5 mg total) by mouth daily.  . pantoprazole (PROTONIX) 40 MG tablet Take 1 tablet (40 mg total) by mouth every other day.  . Probiotic Product  (PROBIOTIC PO) Take 1 tablet by mouth daily.  . Psyllium ([employer] DAILY FIBER PO) Patient takes 1 tablespoon of powder by mouth daily  . vitamin C (ASCORBIC ACID) 500 MG tablet Take 500 mg by mouth daily.  . [DISCONTINUED] flutamide (EULEXIN) 125 MG capsule Take 125 mg by mouth two times a day  . [DISCONTINUED] hyoscyamine (LEVSIN SL) 0.125 MG SL tablet DISSOLVE 1 TABLET UNDER TONGUE EVERY 4-6 HOURS AS NEEDED FOR DIARRHEA  . [DISCONTINUED] Neomycin-Bacitracin-Polymyxin (HCA TRIPLE ANTIBIOTIC OINTMENT EX) Apply 1 application topically daily as needed (irritation to nares).   No facility-administered encounter medications on file as of 03/16/2019.     Review of Systems:  Review of Systems  Constitutional: Positive for fatigue. Negative for activity change and appetite change.  HENT: Negative for facial swelling.   Eyes: Negative.   Respiratory: Negative for shortness of breath.   Cardiovascular: Negative for leg swelling.  Endocrine: Negative.   Musculoskeletal: Positive for neck pain.  Skin: Positive for wound. Negative for color change.    Health Maintenance  Topic Date Due  . MAMMOGRAM  10/27/2018  . INFLUENZA VACCINE  01/07/2019  . TETANUS/TDAP  12/06/2021  . DEXA SCAN  Completed  . PNA vac Low Risk Adult  Completed    Physical Exam: Vitals:   03/16/19 1133  BP: 120/70  Pulse: 67  Resp: 18  Temp: (!) 97.1 F (36.2 C)  SpO2: 96%  Weight: 155 lb 12.8 oz (70.7 kg)  Height: 5\' 4"  (1.626 m)   Body mass index is 26.74 kg/m. Physical Exam Constitutional:      Appearance: Normal appearance. She is normal weight.  HENT:     Head: Normocephalic.     Mouth/Throat:     Mouth: Mucous membranes are moist.     Pharynx: Oropharynx is clear.  Neck:     Musculoskeletal: Neck rigidity present.  Cardiovascular:     Rate and Rhythm: Normal rate and regular rhythm.  Pulmonary:     Effort: Pulmonary effort is normal.     Breath sounds: Normal breath sounds.  Abdominal:     General:  Abdomen is flat. Bowel sounds are normal.     Palpations: Abdomen is soft.  Skin:    General: Skin is warm.     Comments: Right buttock has a small wound, no erythema, no pus, notes with minimal serous drainage  Neurological:     Mental Status: She is alert and oriented to person, place, and time.     Labs reviewed: Basic Metabolic Panel: Recent Labs    04/18/18 0906 12/27/18 0858  NA 140 140  K 4.7 4.7  CL 103  102  CO2 29 29  GLUCOSE 94 97  BUN 18 18  CREATININE 0.77 0.87  CALCIUM 9.7 10.0  TSH  --  3.47   Liver Function Tests: Recent Labs    04/18/18 0906 12/27/18 0858  AST 16 17  ALT 12 16  BILITOT 0.5 0.6  PROT 6.7 6.7   CBC: Recent Labs    04/18/18 0906 12/27/18 0858  WBC 4.8 4.8  NEUTROABS 3,010 3,120  HGB 13.6 13.5  HCT 40.5 42.0  MCV 92.3 97.4  PLT 323 300   Lipid Panel: Recent Labs    04/18/18 0906 12/27/18 0858  CHOL 187 187  HDL 46* 42*  LDLCALC 122* 123*  TRIG 91 117  CHOLHDL 4.1 4.5   Lab Results  Component Value Date   HGBA1C 5.7 07/06/2017     Assessment/Plan  1. Wound of right buttock, initial encounter - Clean right buttock wound with gauze that is wet with saline solution, apply Triple antibiotic and Pain Relief ointment daily till healed  - Allow at least 20 mins for the triple antibiotic to be absorbed before sitting on right buttock.  - Keep area clean    Labs/tests ordered:  None  Next appt:  05/01/2019

## 2019-03-17 ENCOUNTER — Encounter: Payer: Self-pay | Admitting: *Deleted

## 2019-03-23 ENCOUNTER — Other Ambulatory Visit: Payer: Self-pay | Admitting: Internal Medicine

## 2019-03-23 DIAGNOSIS — I10 Essential (primary) hypertension: Secondary | ICD-10-CM

## 2019-04-28 ENCOUNTER — Ambulatory Visit
Admission: RE | Admit: 2019-04-28 | Discharge: 2019-04-28 | Disposition: A | Discharge status: Home / Self Care | Payer: Medicare Other | Source: Ambulatory Visit | Attending: Family | Admitting: Family

## 2019-04-28 ENCOUNTER — Encounter: Payer: Self-pay | Admitting: Family

## 2019-04-28 ENCOUNTER — Ambulatory Visit (INDEPENDENT_AMBULATORY_CARE_PROVIDER_SITE_OTHER): Payer: Medicare Other | Admitting: Family

## 2019-04-28 ENCOUNTER — Other Ambulatory Visit: Payer: Self-pay

## 2019-04-28 VITALS — BP 122/68 | HR 61 | Temp 97.3°F | Resp 18 | Ht 64.0 in | Wt 154.0 lb

## 2019-04-28 DIAGNOSIS — M79671 Pain in right foot: Secondary | ICD-10-CM

## 2019-04-28 DIAGNOSIS — L84 Corns and callosities: Secondary | ICD-10-CM

## 2019-04-28 DIAGNOSIS — M2041 Other hammer toe(s) (acquired), right foot: Secondary | ICD-10-CM

## 2019-04-28 DIAGNOSIS — L853 Xerosis cutis: Secondary | ICD-10-CM

## 2019-04-28 MED ORDER — AQUAPHOR EX OINT: TOPICAL_OINTMENT | Freq: Every day | CUTANEOUS | 0 refills | Status: DC | PRN

## 2019-04-28 NOTE — Progress Notes (Signed)
Provider: Johnisha Louks FNP-C  Gayland Curry, DO  Patient Care Team: Gayland Curry, DO as PCP - General (Geriatric Medicine) Loney Loh, MD (Dermatology) Lafayette Dragon, MD (Inactive) as Consulting Physician (Gastroenterology) Magrinat, Virgie Dad, MD as Consulting Physician (Oncology) Shon Hough, MD as Consulting Physician (Ophthalmology) Leta Baptist, MD as Consulting Physician (Otolaryngology) Rolm Bookbinder, MD as Consulting Physician (General Surgery) Irene Shipper, MD as Consulting Physician (Gastroenterology) Blima Rich, MD as Referring Physician (Obstetrics and Gynecology)  Extended Emergency Contact Information Primary Emergency Contact: Marilyn [name] Phone: [phone] Mobile Phone: [phone] Relation: Sister  Code Status: Full Code  Goals of care: Advanced Directive information Advanced Directives 11/09/2018  Does Patient Have a Medical Advance Directive? No  Type of Advance Directive -  Does patient want to make changes to medical advance directive? -  Copy of [place] in Chart? -  Would patient like information on creating a medical advance directive? -     Chief Complaint  Patient presents with  . Acute Visit    Sore on right foot patient states sore started yesterday   . Medication Management    patient would like discuss capsaicin 0.1 and voltaren cream for shoulder     HPI:  Pt is a 78 y.o. female seen today for an acute visit for evaluation of Soreness  on right foot x 2 days.Patient states sore started yesterday.Patient states worst with stepping on the foot.Feelings like plantar is swollen.  patient would like discuss capsaicin 0.1 and voltaren cream for shoulder.   Past Medical History:  Diagnosis Date  . Alopecia, unspecified   . Breast cancer ([place])    stage 1; right  . Cataracts, bilateral   . Decreased rectal sphincter tone 07/01/12  . Diverticulosis   . Enterocele   . Esophageal stricture  07/01/12  . Hernia   . Hiatal hernia   . Hypertension   . Irritable bowel syndrome   . Lumbago   . Migraine with aura, without mention of intractable migraine without mention of status migrainosus   . Mixed hearing loss, bilateral   . 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 Osteoarthrosis, unspecified whether generalized or localized, unspecified site   . Other and unspecified hyperlipidemia   . Plantar fascial fibromatosis   . Rectal prolapse   . Reflux esophagitis   . Regional enteritis of small intestine ([place])   . Senile osteoporosis   . Spasm of muscle   . Unspecified constipation   . Unspecified hypothyroidism    Past Surgical History:  Procedure Laterality Date  . APPENDECTOMY    . BASAL CELL CARCINOMA EXCISION  1978   neck  . BREAST LUMPECTOMY Right    snbx, apbi  . Cataract removal OD  06/28/2012   . Cataract removal OS  12/13/2012  . dermatolfibroma  2012  . DILATION AND CURETTAGE OF UTERUS    . ENTEROCELE REPAIR    . HAIR TRANSPLANT  2008  . HERNIA REPAIR    . IR FLUORO GUIDE CV LINE RIGHT  07/19/2017  . IR PATIENT EVAL TECH 0-60 MINS  07/29/2017  . IR US GUIDE VASC ACCESS RIGHT  07/19/2017  . OOPHORECTOMY    . strabisumus eye surgery     x 2  . TONSILLECTOMY      Allergies  Allergen Reactions  . Gabapentin Other (See Comments)    Balance disturbance  . Cetacaine [Butamben-Tetracaine-Benzocaine] Other (See Comments)    LOST HER SENSE OF TASTE FOR  OVER A MONTH!!!    Outpatient Encounter Medications as of 04/28/2019  Medication Sig  . acetaminophen (TYLENOL) 500 MG tablet Take 500 mg by mouth every 6 (six) hours as needed for mild pain. Patient states every 8 hours as well  . BYSTOLIC 5 MG tablet TAKE 1 TABLET BY MOUTH EVERY DAY  . CALCIUM-VITAMIN D PO Take 1 capsule by mouth daily.  . Capsaicin 0.1 % CREA Apply 1 application topically as needed.  . diclofenac Sodium (VOLTAREN) 1 % GEL Apply 2 g topically as needed.  . diphenhydrAMINE  (BENADRYL) 25 MG tablet Take 12.5-25 mg by mouth at bedtime as needed for sleep.   [name] losartan (COZAAR) 100 MG tablet TAKE 1 TABLET BY MOUTH EVERY DAY  . Melatonin 3 MG TABS Take 1 tablet by mouth as needed. Alternate taking between this and benadryl  . minoxidil (LONITEN) 2.5 MG tablet 1.25mg  by mouth daily  . Multiple Vitamins-Minerals (MULTIVITAMIN & MINERAL PO) Take 1 tablet by mouth daily.  [name] MYRBETRIQ 25 MG TB24 tablet TAKE 1 TABLET BY MOUTH EVERY DAY  . pantoprazole (PROTONIX) 40 MG tablet Take 1 tablet (40 mg total) by mouth every other day.  . Probiotic Product (PROBIOTIC PO) Take 1 tablet by mouth daily.  . Psyllium ([facility] DAILY FIBER PO) Patient takes 1 tablespoon of powder by mouth daily  . vitamin C (ASCORBIC ACID) 500 MG tablet Take 500 mg by mouth daily.   No facility-administered encounter medications on file as of 04/28/2019.     Review of Systems  Constitutional: Negative for appetite change, chills, fatigue and fever.  Respiratory: Negative for cough, chest tightness, shortness of breath and wheezing.   Cardiovascular: Negative for chest pain, palpitations and leg swelling.  Gastrointestinal: Negative for abdominal distention, abdominal pain, constipation, diarrhea, nausea and vomiting.  Musculoskeletal: Negative for gait problem.       Right foot pain   Skin: Negative for color change, pallor, rash and wound.  Neurological: Negative for dizziness, speech difficulty, weakness, light-headedness, numbness and headaches.  Psychiatric/Behavioral: Negative for agitation and sleep disturbance. The patient is not nervous/anxious.     Immunization History  Administered Date(s) Administered  . DTaP 06/08/1996  . Fluad Quad(high Dose 65+) 02/09/2019  . Hepatitis A 10/18/1996, 11/15/1997  . Influenza, High Dose Seasonal PF 03/13/2017, 03/29/2018  . Influenza,inj,Quad PF,6+ Mos 03/01/2013, 03/02/2014, 03/05/2015, 04/14/2016  . Influenza-Unspecified 04/07/2016  . Pneumococcal  Conjugate-13 09/10/2015  . Pneumococcal-Unspecified 05/07/2010  . Td 09/04/1981, 02/11/1993, 12/07/2011  . Tdap 12/07/2011  . Yellow Fever 11/08/1987  . Zoster 11/30/2006  . Zoster Recombinat (Shingrix) 06/09/2017   Pertinent  Health Maintenance Due  Topic Date Due  . MAMMOGRAM  03/15/2020  . INFLUENZA VACCINE  Completed  . DEXA SCAN  Completed  . PNA vac Low Risk Adult  Completed   Fall Risk  04/28/2019 03/16/2019 03/15/2019 12/29/2018 11/09/2018  Falls in the past year? 0 0 0 0 0  Number falls in past yr: 0 - 0 0 0  Injury with Fall? 0 - 0 0 0  Risk for fall due to : - - - - -  Follow up - - - - -    Vitals:   04/28/19 1318  BP: 122/68  Pulse: 61  Temp: (!) 97.3 F (36.3 C)  TempSrc: Temporal  SpO2: 96%  Weight: 154 lb (69.9 kg)  Height: 5\' 4"  (1.626 m)   Body mass index is 26.43 kg/m. Physical Exam Constitutional:      General: She  is not in acute distress.    Appearance: She is overweight. She is not ill-appearing.  HENT:     Head: Normocephalic.  Eyes:     General: No scleral icterus.       Right eye: No discharge.        Left eye: No discharge.     Conjunctiva/sclera: Conjunctivae normal.     Pupils: Pupils are equal, round, and reactive to light.  Cardiovascular:     Rate and Rhythm: Normal rate and regular rhythm.     Pulses: Normal pulses.     Heart sounds: Normal heart sounds. No murmur. No friction rub. No gallop.   Pulmonary:     Effort: Pulmonary effort is normal. No respiratory distress.     Breath sounds: Normal breath sounds. No wheezing, rhonchi or rales.  Chest:     Chest wall: No tenderness.  Abdominal:     General: Bowel sounds are normal. There is no distension.     Palpations: Abdomen is soft. There is no mass.     Tenderness: There is no abdominal tenderness. There is no right CVA tenderness, left CVA tenderness, guarding or rebound.  Musculoskeletal: Normal range of motion.        General: No swelling.     Right lower leg: No edema.      Left lower leg: No edema.       Feet:     Comments: Right 2nd and 3 rd hammer toe and left 2nd hammertoe.right foot plantar tender to palpation and sharp bone noted on palpation.   Skin:    General: Skin is warm and dry.     Coloration: Skin is not pale.     Findings: No bruising, erythema or rash.     Comments: Bilateral lower extremities dry scaly skin   Neurological:     Mental Status: She is alert and oriented to person, place, and time.     Cranial Nerves: No cranial nerve deficit.     Sensory: No sensory deficit.     Motor: No weakness.     Gait: Gait normal.  Psychiatric:        Mood and Affect: Mood normal.        Behavior: Behavior normal.        Thought Content: Thought content normal.        Judgment: Judgment normal.    Labs reviewed: Recent Labs    12/27/18 0858  NA 140  K 4.7  CL 102  CO2 29  GLUCOSE 97  BUN 18  CREATININE 0.87  CALCIUM 10.0   Recent Labs    12/27/18 0858  AST 17  ALT 16  BILITOT 0.6  PROT 6.7   Recent Labs    12/27/18 0858  WBC 4.8  NEUTROABS 3,120  HGB 13.5  HCT 42.0  MCV 97.4  PLT 300   Lab Results  Component Value Date   TSH 3.47 12/27/2018   Lab Results  Component Value Date   HGBA1C 5.7 07/06/2017   Lab Results  Component Value Date   CHOL 187 12/27/2018   HDL 42 (L) 12/27/2018   LDLCALC 123 (H) 12/27/2018   TRIG 117 12/27/2018   CHOLHDL 4.5 12/27/2018    Significant Diagnostic Results in last 30 days:  No results found.  Assessment/Plan 1. Right foot pain Plantar tender to palpation. - DG Foot Complete Right; Future - Ambulatory referral to Podiatry  2. Dry skin dermatitis Bilateral leg dry scaly skin.Encouraged to increase  water intake.apply Aquaphor or equivalent daily. - mineral oil-hydrophilic petrolatum (AQUAPHOR) ointment; Apply topically daily as needed for dry skin. Apply to both leg  Dispense: 420 g; Refill: 0  3. Callus Right lateral foot hard,dry callus. - Ambulatory referral to  Podiatry  4. Hammer toe of right foot Several toes with hammer toes. - Ambulatory referral to Podiatry  Family/ staff Communication: Reviewed plan of care with patient.  Labs/tests ordered: - DG Foot Complete Right; Future  Nelda Bucks Jonny Longino, NP

## 2019-04-28 NOTE — Patient Instructions (Signed)
1. Get right foot X-ray done at [facility] [address] [facility] [place]  2. Apply Aquaphor ointment to lower extremities once daily and as needed for dry skin

## 2019-05-01 ENCOUNTER — Other Ambulatory Visit: Payer: Self-pay

## 2019-05-01 ENCOUNTER — Encounter: Payer: Self-pay | Admitting: Internal Medicine

## 2019-05-01 ENCOUNTER — Ambulatory Visit (INDEPENDENT_AMBULATORY_CARE_PROVIDER_SITE_OTHER): Payer: Medicare Other | Admitting: Internal Medicine

## 2019-05-01 VITALS — BP 129/68 | HR 64 | Temp 98.3°F | Ht 64.0 in | Wt 156.0 lb

## 2019-05-01 DIAGNOSIS — G7241 Inclusion body myositis [IBM]: Secondary | ICD-10-CM

## 2019-05-01 DIAGNOSIS — N3281 Overactive bladder: Secondary | ICD-10-CM

## 2019-05-01 DIAGNOSIS — F419 Anxiety disorder, unspecified: Secondary | ICD-10-CM

## 2019-05-01 DIAGNOSIS — I10 Essential (primary) hypertension: Secondary | ICD-10-CM

## 2019-05-01 DIAGNOSIS — M79671 Pain in right foot: Secondary | ICD-10-CM

## 2019-05-01 DIAGNOSIS — R29898 Other symptoms and signs involving the musculoskeletal system: Secondary | ICD-10-CM | POA: Diagnosis not present

## 2019-05-01 NOTE — Progress Notes (Signed)
Location:  [facility] clinic  Provider: Dr. Hollace Kinnier  Goals of Care:  Advanced Directives 11/09/2018  Does Patient Have a Medical Advance Directive? No  Type of Advance Directive -  Does patient want to make changes to medical advance directive? -  Copy of [place] in Chart? -  Would patient like information on creating a medical advance directive? -     No chief complaint on file.   HPI: Patient is a 78 y.o. female seen today for medical management of chronic diseases.    Her right foot is less painful. She was seen the other day by another provider. She had her foot x-ray done. Results display no signs or fracture or dislocation. She will take one tylenol 500 mg twice a day. She believes the pain has subsided. Concerned she can still feel a bony mass on the bottom of her right food. Asking if she needs to see a podiatrist.   Upset she has been unable to swim due to covid. Tired of staying at home with her dogs. Wishes life would return to normal.   She still plans to move back to [place] when she can. This is on hold until covid has subsided. The thoughts of moving have increased her anxiety. At night she will not be able to sleep because her anxiety is increased. Denies any panic attacks.   She is still taking her Myrbetriq daily. No incidents of bladder incontinence.   Taking her blood pressure medication daily. She is trying to avoid high salty foods. This is hard since she does not like to cook. Worried she is not eating enough vegetables. Her neighbors buy her groceries for her.   She received her flu vaccine from [facility] at beginning of flu season.          Past Medical History:  Diagnosis Date  . Alopecia, unspecified   . Breast cancer ([place])    stage 1; right  . Cataracts, bilateral   . Decreased rectal sphincter tone 07/01/12  . Diverticulosis   . Enterocele   . Esophageal stricture 07/01/12  . Hernia   . Hiatal hernia   . Hypertension   .  Irritable bowel syndrome   . Lumbago   . Migraine with aura, without mention of intractable migraine without mention of status migrainosus   . Mixed hearing loss, bilateral   . 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 Osteoarthrosis, unspecified whether generalized or localized, unspecified site   . Other and unspecified hyperlipidemia   . Plantar fascial fibromatosis   . Rectal prolapse   . Reflux esophagitis   . Regional enteritis of small intestine ([place])   . Senile osteoporosis   . Spasm of muscle   . Unspecified constipation   . Unspecified hypothyroidism     Past Surgical History:  Procedure Laterality Date  . APPENDECTOMY    . BASAL CELL CARCINOMA EXCISION  1978   neck  . BREAST LUMPECTOMY Right    snbx, apbi  . Cataract removal OD  06/28/2012   . Cataract removal OS  12/13/2012  . dermatolfibroma  2012  . DILATION AND CURETTAGE OF UTERUS    . ENTEROCELE REPAIR    . HAIR TRANSPLANT  2008  . HERNIA REPAIR    . IR FLUORO GUIDE CV LINE RIGHT  07/19/2017  . IR PATIENT EVAL TECH 0-60 MINS  07/29/2017  . IR US GUIDE VASC ACCESS RIGHT  07/19/2017  . OOPHORECTOMY    . strabisumus  eye surgery     x 2  . TONSILLECTOMY      Allergies  Allergen Reactions  . Gabapentin Other (See Comments)    Balance disturbance  . Cetacaine [Butamben-Tetracaine-Benzocaine] Other (See Comments)    LOST HER SENSE OF TASTE FOR OVER A MONTH!!!    Outpatient Encounter Medications as of 05/01/2019  Medication Sig  . acetaminophen (TYLENOL) 500 MG tablet Take 500 mg by mouth every 6 (six) hours as needed for mild pain. Patient states every 8 hours as well  . BYSTOLIC 5 MG tablet TAKE 1 TABLET BY MOUTH EVERY DAY  . CALCIUM-VITAMIN D PO Take 1 capsule by mouth daily.  . Capsaicin 0.1 % CREA Apply 1 application topically as needed.  . diclofenac Sodium (VOLTAREN) 1 % GEL Apply 2 g topically as needed.  . diphenhydrAMINE (BENADRYL) 25 MG tablet Take 12.5-25 mg by mouth at bedtime as  needed for sleep.   [name] losartan (COZAAR) 100 MG tablet TAKE 1 TABLET BY MOUTH EVERY DAY  . Melatonin 3 MG TABS Take 1 tablet by mouth as needed. Alternate taking between this and benadryl  . mineral oil-hydrophilic petrolatum (AQUAPHOR) ointment Apply topically daily as needed for dry skin. Apply to both leg  . minoxidil (LONITEN) 2.5 MG tablet 1.25mg  by mouth daily  . Multiple Vitamins-Minerals (MULTIVITAMIN & MINERAL PO) Take 1 tablet by mouth daily.  [name] MYRBETRIQ 25 MG TB24 tablet TAKE 1 TABLET BY MOUTH EVERY DAY  . pantoprazole (PROTONIX) 40 MG tablet Take 1 tablet (40 mg total) by mouth every other day.  . Probiotic Product (PROBIOTIC PO) Take 1 tablet by mouth daily.  . Psyllium ([facility] DAILY FIBER PO) Patient takes 1 tablespoon of powder by mouth daily  . vitamin C (ASCORBIC ACID) 500 MG tablet Take 500 mg by mouth daily.   No facility-administered encounter medications on file as of 05/01/2019.     Review of Systems:  Review of Systems  Constitutional: Negative for activity change, appetite change and fever.  HENT: Negative for dental problem, hearing loss and trouble swallowing.   Eyes: Negative for photophobia and visual disturbance.  Respiratory: Negative for cough, shortness of breath and wheezing.   Cardiovascular: Negative for chest pain and palpitations.  Gastrointestinal: Negative for abdominal pain, constipation, diarrhea and nausea.  Endocrine: Negative for polydipsia and polyphagia.  Genitourinary: Negative for dysuria, frequency and hematuria.  Musculoskeletal: Positive for arthralgias and myalgias.       Right shoulder pain. Right foot pain  Skin:       Dry skin  Neurological: Negative for dizziness and headaches.  Psychiatric/Behavioral: Positive for sleep disturbance. Negative for dysphoric mood. The patient is nervous/anxious.   All other systems reviewed and are negative.   Health Maintenance  Topic Date Due  . MAMMOGRAM  03/15/2020  . TETANUS/TDAP  12/06/2021   . INFLUENZA VACCINE  Completed  . DEXA SCAN  Completed  . PNA vac Low Risk Adult  Completed    Physical Exam: There were no vitals filed for this visit. There is no height or weight on file to calculate BMI. Physical Exam Vitals signs reviewed.  Constitutional:      Appearance: Normal appearance. She is normal weight.  HENT:     Head: Normocephalic.  Neck:     Musculoskeletal: Normal range of motion and neck supple.  Cardiovascular:     Rate and Rhythm: Normal rate and regular rhythm.     Pulses:  Dorsalis pedis pulses are 1+ on the right side.     Heart sounds: No murmur.  Pulmonary:     Effort: Pulmonary effort is normal. No respiratory distress.     Breath sounds: Normal breath sounds. No wheezing.  Abdominal:     General: Abdomen is flat. Bowel sounds are normal.     Palpations: Abdomen is soft.  Musculoskeletal: Normal range of motion.     Right lower leg: No edema.     Left lower leg: No edema.     Right foot: Normal range of motion.       Feet:  Feet:     Right foot:     Protective Sensation: 10 sites tested. 10 sites sensed.     Skin integrity: Dry skin present.     Toenail Condition: Right toenails are normal.  Skin:    General: Skin is warm and dry.     Capillary Refill: Capillary refill takes less than 2 seconds.  Neurological:     General: No focal deficit present.     Mental Status: She is alert and oriented to person, place, and time. Mental status is at baseline.     Sensory: No sensory deficit.     Motor: Weakness present.     Gait: Gait normal.  Psychiatric:        Mood and Affect: Mood normal.        Behavior: Behavior normal.        Thought Content: Thought content normal.        Judgment: Judgment normal.     Comments: Refers to notes to answer multiple questions.      Labs reviewed: Basic Metabolic Panel: Recent Labs    12/27/18 0858  NA 140  K 4.7  CL 102  CO2 29  GLUCOSE 97  BUN 18  CREATININE 0.87  CALCIUM 10.0   TSH 3.47   Liver Function Tests: Recent Labs    12/27/18 0858  AST 17  ALT 16  BILITOT 0.6  PROT 6.7   No results for input(s): LIPASE, AMYLASE in the last 8760 hours. No results for input(s): AMMONIA in the last 8760 hours. CBC: Recent Labs    12/27/18 0858  WBC 4.8  NEUTROABS 3,120  HGB 13.5  HCT 42.0  MCV 97.4  PLT 300   Lipid Panel: Recent Labs    12/27/18 0858  CHOL 187  HDL 42*  LDLCALC 123*  TRIG 117  CHOLHDL 4.5   Lab Results  Component Value Date   HGBA1C 5.7 07/06/2017    Procedures since last visit: Dg Foot Complete Right  Result Date: 04/29/2019 CLINICAL DATA:  Foot pain for 1 week EXAM: RIGHT FOOT COMPLETE - 3+ VIEW COMPARISON:  None. FINDINGS: There is no evidence of fracture or dislocation. There is no evidence of arthropathy or other focal bone abnormality. There is diffuse osseous demineralization. Soft tissues are unremarkable. IMPRESSION: Negative. Electronically Signed   By: Zerita Boers M.D.   On: 04/29/2019 14:20    Assessment/Plan 1. Right foot pain - small mass palpable on bottom of foot - 2nd, 3rd and 4th toes of right foot contracted and tender - referral to podiatry - avoid tight shoes - continue using tylenol for pain  2. Inclusion body myositis (IBM) - followed by Dr. Donn Pierini at [facility] - encourage exercises recommended by Dr. Donn Pierini  3. 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 Donn Pierini  4. OAB (overactive bladder) -  stable, no progression - continue myrbetriq  5. Anxiety - overwhelmed with anticipated move to [place] - the thought of moving and being dependent on siblings increases her worry, no reported panic attacks at this time - told to contact PCP if anxiety increases or panic attacks occur  6. Essential hypertension - bp at goal <150/90 - continue current medication regimen - avoid processed foods and food high in sodium - complete blood count with  differential/platelets- future - complete metabolic panel with GFR- future - lipid panel- future     Labs/tests ordered: Podiatry referral- today, complete blood count with differential, complete metabolic panel with GFR, lipid panel- future Next appt:  6 month follow up

## 2019-05-01 NOTE — Patient Instructions (Addendum)
Suggest veggies and hummus.     Here are a couple of websites of groups that help older adults with moving:    http://hometohomeguilford.com/senior-move-management-specialists/  https://brown.org/  I recommend a geriatrician in [place].  Someone fellowship trained to take care of older adults.    Please get the moving process started so YOU can participate in the decision-making process.  It will only get harder if you wait!

## 2019-05-02 ENCOUNTER — Telehealth: Payer: Self-pay | Admitting: *Deleted

## 2019-05-02 NOTE — Telephone Encounter (Signed)
She does not recall what we discussed.  She asked about it after we finished the visit as she was walking out of the exam room.   Please let her know:   I suggested using over the counter muscle rubs like theragesic cream or aspercreme, applying heat and avoiding overuse.  She also should do the exercises that were prescribed by her myositis expert at [facility].

## 2019-05-02 NOTE — Telephone Encounter (Signed)
Patient notified and agreed.  

## 2019-05-02 NOTE — Telephone Encounter (Signed)
Patient called and stated that she was seen yesterday and stated that she was having Right Shoulder Pain and didn't get much feedback as to what she could use for that pain. Patient would like to know what would help with this pain. Please Advise.

## 2019-05-16 ENCOUNTER — Ambulatory Visit: Payer: Medicare Other | Admitting: Podiatry

## 2019-07-04 ENCOUNTER — Ambulatory Visit: Payer: Medicare Other

## 2019-07-08 ENCOUNTER — Ambulatory Visit: Payer: Medicare Other

## 2019-07-13 ENCOUNTER — Ambulatory Visit: Payer: Medicare Other

## 2019-07-14 ENCOUNTER — Other Ambulatory Visit: Payer: Self-pay | Admitting: Internal Medicine

## 2019-07-14 ENCOUNTER — Ambulatory Visit: Payer: Medicare PPO

## 2019-07-14 DIAGNOSIS — I10 Essential (primary) hypertension: Secondary | ICD-10-CM

## 2019-07-15 ENCOUNTER — Ambulatory Visit: Payer: Medicare Other

## 2019-07-28 ENCOUNTER — Telehealth: Payer: Self-pay | Admitting: *Deleted

## 2019-07-28 NOTE — Telephone Encounter (Signed)
I agree she will need an appointment.  I know we assessed her memory last time which showed some mild changes.  We were going to monitor it carefully.  We can reassess that at an appt and we can see if she's had further progression since we last met.

## 2019-07-28 NOTE — Telephone Encounter (Signed)
Spoke with patient's sister and advised results

## 2019-07-28 NOTE — Telephone Encounter (Signed)
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 I advised to sister that they would need to make and appointment to have the patient evaluated. Please advise

## 2019-08-07 NOTE — Telephone Encounter (Signed)
She needs a sooner appt, yes.

## 2019-08-07 NOTE — Telephone Encounter (Signed)
Sister calling again asking if pt should be seen sooner? I tried making an appt sooner that May but sister states she want Dr. Mariea Clonts to make that decision. Sister and brother are concerned with pt and if she may wonder off. I advised they could add cameras and maybe get a tracker? Please advise

## 2019-08-08 NOTE — Telephone Encounter (Signed)
Spoke with patient and advised results, also spoke with sister. appt scheduled

## 2019-08-14 ENCOUNTER — Other Ambulatory Visit: Payer: Self-pay

## 2019-08-14 ENCOUNTER — Encounter: Payer: Self-pay | Admitting: Internal Medicine

## 2019-08-14 ENCOUNTER — Ambulatory Visit (INDEPENDENT_AMBULATORY_CARE_PROVIDER_SITE_OTHER): Payer: Medicare PPO | Admitting: Internal Medicine

## 2019-08-14 VITALS — BP 118/72 | HR 60 | Temp 98.5°F | Ht 64.0 in | Wt 153.0 lb

## 2019-08-14 DIAGNOSIS — G7241 Inclusion body myositis [IBM]: Secondary | ICD-10-CM

## 2019-08-14 DIAGNOSIS — M25511 Pain in right shoulder: Secondary | ICD-10-CM | POA: Diagnosis not present

## 2019-08-14 DIAGNOSIS — G3184 Mild cognitive impairment, so stated: Secondary | ICD-10-CM

## 2019-08-14 DIAGNOSIS — G8929 Other chronic pain: Secondary | ICD-10-CM | POA: Diagnosis not present

## 2019-08-14 DIAGNOSIS — R29898 Other symptoms and signs involving the musculoskeletal system: Secondary | ICD-10-CM

## 2019-08-14 DIAGNOSIS — I10 Essential (primary) hypertension: Secondary | ICD-10-CM

## 2019-08-14 DIAGNOSIS — N3281 Overactive bladder: Secondary | ICD-10-CM | POA: Diagnosis not present

## 2019-08-14 NOTE — Patient Instructions (Addendum)
Stop benadryl.  It's ok to take melatonin up to 5mg  one hour before you want to be asleep.  You can increase your tylenol to 1000mg  twice daily.  Voltaren gel over-the-counter is fine to use on the right shoulder and arm.    Stop myrbetriq since it's not helping.  Go get an xray of your right shoulder at [facility] at [facility].  Resume life alert button service.  Please bring all of your medication at your next visit.

## 2019-08-14 NOTE — Progress Notes (Signed)
Location:  [facility] clinic  Provider: Dr. Hollace Kinnier  Goals of Care:  Advanced Directives 08/14/2019  Does Patient Have a Medical Advance Directive? No  Type of Advance Directive -  Does patient want to make changes to medical advance directive? -  Copy of [place] in Chart? -  Would patient like information on creating a medical advance directive? No - Patient declined     Chief Complaint  Patient presents with  . Medical Management of Chronic Issues    6 month follow up / memory concearns(medication)? sleeping problems. she would like to call sister [name] to be on this visit   pain mangement of shoulder     HPI: Patient is a 79 y.o. female seen today for medical management of chronic diseases.    Sister, [name] present for visit via video chat.   She has received her first covid vaccine. Second dose will be 08/15/19. No reactions.   Still planning to move to [place], [place]. She is overwhelmed with the upcoming move.   Family is concerned about her memory. Sister, [name] is the most concerned. Her sister has not shared why she is concerned with her memory. Father had been diagnosed with Alzheimers and passed at the age of 75.   She is taking benadryl daily to help with her sleep. She is not taking melatonin unless the benadryl does not work. The stress of moving has affected her sleeping and she is having trouble going to sleep more often.   She had tree damage earlier this year. She has been told she asked the tree company is they had been paid three times.  Does not think she has issues with paying her bills. She is working on letting her sister help her.   Uses pill packs to remember to take her pills daily. Interested in reducing the amount of medication she takes if she can.   She is not doing her own shopping. Her neighbors help her or she uses delivery, drive-thru services.   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 Right shoulder and upper arm is still very  painful. Movement stimulates pain. Takes 500 mg every morning and evening. Interested in a topical medication for pain.   She is interested in what level of care would be needed if she needs a facility.    Past Medical History:  Diagnosis Date  . Alopecia, unspecified   . Breast cancer ([place])    stage 1; right  . Cataracts, bilateral   . Decreased rectal sphincter tone 07/01/12  . Diverticulosis   . Enterocele   . Esophageal stricture 07/01/12  . Hernia   . Hiatal hernia   . Hypertension   . Irritable bowel syndrome   . Lumbago   . Migraine with aura, without mention of intractable migraine without mention of status migrainosus   . Mixed hearing loss, bilateral   . 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 Osteoarthrosis, unspecified whether generalized or localized, unspecified site   . Other and unspecified hyperlipidemia   . Plantar fascial fibromatosis   . Rectal prolapse   . Reflux esophagitis   . Regional enteritis of small intestine ([place])   . Senile osteoporosis   . Spasm of muscle   . Unspecified constipation   . Unspecified hypothyroidism     Past Surgical History:  Procedure Laterality Date  . APPENDECTOMY    . BASAL CELL CARCINOMA EXCISION  1978   neck  . BREAST LUMPECTOMY Right  snbx, apbi  . Cataract removal OD  06/28/2012   . Cataract removal OS  12/13/2012  . dermatolfibroma  2012  . DILATION AND CURETTAGE OF UTERUS    . ENTEROCELE REPAIR    . HAIR TRANSPLANT  2008  . HERNIA REPAIR    . IR FLUORO GUIDE CV LINE RIGHT  07/19/2017  . IR PATIENT EVAL TECH 0-60 MINS  07/29/2017  . IR US GUIDE VASC ACCESS RIGHT  07/19/2017  . OOPHORECTOMY    . strabisumus eye surgery     x 2  . TONSILLECTOMY      Allergies  Allergen Reactions  . Gabapentin Other (See Comments)    Balance disturbance  . Cetacaine [Butamben-Tetracaine-Benzocaine] Other (See Comments)    LOST HER SENSE OF TASTE FOR OVER A MONTH!!!    Outpatient Encounter Medications as of  08/14/2019  Medication Sig  . acetaminophen (TYLENOL) 500 MG tablet Take 500 mg by mouth every 6 (six) hours as needed for mild pain. Patient states every 8 hours as well  . BYSTOLIC 5 MG tablet TAKE 1 TABLET BY MOUTH EVERY DAY  . diphenhydrAMINE (BENADRYL) 25 MG tablet Take 12.5-25 mg by mouth at bedtime as needed for sleep.   [name] losartan (COZAAR) 100 MG tablet TAKE 1 TABLET BY MOUTH EVERY DAY  . Melatonin 3 MG TABS Take 1 tablet by mouth as needed. Alternate taking between this and benadryl  . mineral oil-hydrophilic petrolatum (AQUAPHOR) ointment Apply topically daily as needed for dry skin. Apply to both leg  . minoxidil (LONITEN) 2.5 MG tablet 1.25mg  by mouth daily  . Misc Natural Products (FOCUSED MIND PO) Take by mouth 2 (two) times daily.  . Multiple Vitamins-Minerals (MULTIVITAMIN & MINERAL PO) Take 1 tablet by mouth daily.  [name] MYRBETRIQ 25 MG TB24 tablet TAKE 1 TABLET BY MOUTH EVERY DAY  . pantoprazole (PROTONIX) 40 MG tablet Take 1 tablet (40 mg total) by mouth every other day.  . Probiotic Product (PROBIOTIC PO) Take 1 tablet by mouth daily.  . Psyllium ([employer] DAILY FIBER PO) Patient takes 1 tablespoon of powder by mouth daily  . vitamin C (ASCORBIC ACID) 500 MG tablet Take 500 mg by mouth daily.  . [DISCONTINUED] CALCIUM-VITAMIN D PO Take 1 capsule by mouth daily.  . [DISCONTINUED] Capsaicin 0.1 % CREA Apply 1 application topically as needed.  . [DISCONTINUED] diclofenac Sodium (VOLTAREN) 1 % GEL Apply 2 g topically as needed.   No facility-administered encounter medications on file as of 08/14/2019.    Review of Systems:  Review of Systems  Constitutional: Negative for activity change, appetite change and fatigue.  HENT: Negative for dental problem, hearing loss and trouble swallowing.   Eyes: Negative for photophobia and visual disturbance.  Respiratory: Negative for cough and shortness of breath.   Cardiovascular: Negative for chest pain and leg swelling.  Genitourinary:  Positive for frequency. Negative for dysuria and hematuria.  Musculoskeletal: Positive for arthralgias and myalgias.       Right shoulder pain  Skin: Negative.   Neurological: Negative for dizziness, weakness, light-headedness and headaches.  Psychiatric/Behavioral: Positive for confusion and sleep disturbance. Negative for agitation and dysphoric mood. The patient is not nervous/anxious.   All other systems reviewed and are negative.   Health Maintenance  Topic Date Due  . MAMMOGRAM  03/15/2020  . TETANUS/TDAP  12/06/2021  . INFLUENZA VACCINE  Completed  . DEXA SCAN  Completed  . PNA vac Low Risk Adult  Completed    Physical Exam: Vitals:  08/14/19 1547  BP: 118/72  Pulse: 60  Temp: 98.5 F (36.9 C)  TempSrc: Temporal  SpO2: 96%  Weight: 153 lb (69.4 kg)  Height: 5\' 4"  (1.626 m)   Body mass index is 26.26 kg/m. Physical Exam Vitals reviewed.  Constitutional:      Appearance: Normal appearance. She is normal weight.  HENT:     Head: Normocephalic.  Neck:     Comments: Head pushed forward when standing or sitting Cardiovascular:     Rate and Rhythm: Normal rate and regular rhythm.     Pulses: Normal pulses.     Heart sounds: Normal heart sounds. No murmur.  Pulmonary:     Effort: Pulmonary effort is normal. No respiratory distress.     Breath sounds: Normal breath sounds. No wheezing.  Abdominal:     General: Abdomen is flat. Bowel sounds are normal.     Palpations: Abdomen is soft.  Musculoskeletal:     Right shoulder: No swelling, deformity or tenderness. Decreased range of motion.     Cervical back: Full passive range of motion without pain and normal range of motion.     Right lower leg: No edema.     Left lower leg: No edema.  Skin:    General: Skin is warm and dry.     Capillary Refill: Capillary refill takes less than 2 seconds.  Neurological:     General: No focal deficit present.     Mental Status: She is alert and oriented to person, place, and  time. Mental status is at baseline.     Motor: No weakness.     Coordination: Coordination normal.     Gait: Gait normal.  Psychiatric:        Mood and Affect: Mood normal.        Behavior: Behavior normal.        Thought Content: Thought content normal.        Judgment: Judgment normal.     Labs reviewed: Basic Metabolic Panel: Recent Labs    12/27/18 0858  NA 140  K 4.7  CL 102  CO2 29  GLUCOSE 97  BUN 18  CREATININE 0.87  CALCIUM 10.0  TSH 3.47   Liver Function Tests: Recent Labs    12/27/18 0858  AST 17  ALT 16  BILITOT 0.6  PROT 6.7   No results for input(s): LIPASE, AMYLASE in the last 8760 hours. No results for input(s): AMMONIA in the last 8760 hours. CBC: Recent Labs    12/27/18 0858  WBC 4.8  NEUTROABS 3,120  HGB 13.5  HCT 42.0  MCV 97.4  PLT 300   Lipid Panel: Recent Labs    12/27/18 0858  CHOL 187  HDL 42*  LDLCALC 123*  TRIG 117  CHOLHDL 4.5   Lab Results  Component Value Date   HGBA1C 5.7 07/06/2017    Procedures since last visit: No results found.  Assessment/Plan 1. Chronic right shoulder pain - unresolved, she had decreased ROM  -recommend increasing tylenol to 1000 mg twice daily for pain - may start voltaren gel OTC - order DG shoulder right  2. Inclusion body myositis (IBM) - followed by Dr[facility]Jule at [facility] - encourage exercises recommended by Dr. Donn Pierini - avoid heavy lifting and strenuous activity, like cleaning  3. OAB (overactive bladder) - still experiencing frequency at times, but does not think medication is helping -  Stop myrbetriq  4. Dropped head syndrome - head pushed forward when standing or sitting - denies  cervical pain  5. Essential hypertension - bp at goal <150/90 - continue current medication regimen  6. Mild cognitive impairment - MMSE last visit 30/30, today 28/30 - she is showing some decline with short term memory - she had been taking benadryl daily for insomnia - discontinue benadryl,  may continue melatonin for insomnia - unsure about her daily medications and what she is taking - advised to follow up next week with PCP to confirm meciations    Labs/tests ordered: DG shoulder right Next appt:  11/14/2019

## 2019-08-15 ENCOUNTER — Ambulatory Visit
Admission: RE | Admit: 2019-08-15 | Discharge: 2019-08-15 | Disposition: A | Discharge status: Home / Self Care | Payer: Medicare PPO | Source: Ambulatory Visit | Attending: Internal Medicine | Admitting: Internal Medicine

## 2019-08-15 ENCOUNTER — Telehealth: Payer: Self-pay

## 2019-08-15 DIAGNOSIS — M25511 Pain in right shoulder: Secondary | ICD-10-CM | POA: Diagnosis not present

## 2019-08-15 DIAGNOSIS — G8929 Other chronic pain: Secondary | ICD-10-CM

## 2019-08-15 NOTE — Telephone Encounter (Signed)
I do not think Marilyn [name] should be driving.  We briefly discussed that during the visit because of the clock drawing and pentagon challenges.  The [place] to [place].  As far as Marilyn [name] seeing the AVS from [place], ideally, we could get her access via mychart if Marilyn [name] is interested and Marilyn [name] has signed the [facility].

## 2019-08-15 NOTE — Telephone Encounter (Signed)
[name]) called and was asking questions about MMSE.  She stated that she understood her sister scored a 73/30, but didn't do well with the clock or the drawing.  Marilyn [name] questions if [name] should be driving due to this. She states [name] only drives to places she is familiar with.  A friend took her for her COVID vaccine today and to get her shoulder x-rayed, as [name] was not comfortable trying to drive herself to an unfamiliar location.   Marilyn [name] also stated that [name] said she was not given an AVS yesterday.  Marilyn [name] stated that it was reviewed with her and that she put it in her bag. This was relayed to [name].

## 2019-08-16 ENCOUNTER — Telehealth: Payer: Self-pay | Admitting: *Deleted

## 2019-08-16 NOTE — Telephone Encounter (Signed)
I called [name] and talked with her for an extended period of time.  She asked me to describe the clock process, which I did twice.  I told her that patient should not be driving and she stated that patient refused to take an [employer] because she was afraid she would be "raped." Jilda Roche are helping her with grocery shopping.  [name] stated that patient is aware that she needs help and expressed gratitude that [name] is helping her.   [name] stated that she had arranged for someone to come to patient's house on 3/18 to help her start the downsizing process. There is no set date when she will move, but patient has viewed some assisted living facilities and there are openings available.   [name] is requesting that you discuss with patient that she should not be driving.  She feels that it will be better received coming from you, as she will not think she and her brother are being mean by taking her keys.  [name] stated that she is reinforcing daily that patient has an appointment with you on Monday and the time, and that they plan to let her drive to the appointment at this time, unless you feel that she shouldn't.

## 2019-08-16 NOTE — Progress Notes (Signed)
Please notify [name], her sister:  Marilyn [name] has severe arthritis in her right shoulder.  She is not a good candidate for a shoulder replacement, but we could consider a steroid injection in her shoulder which may help her pain if she's willing/interested--I can do this.  I think she has gotten some degree of frozen shoulder after not using the joint for so long.

## 2019-08-16 NOTE — Telephone Encounter (Signed)
Thank you, Shelton Silvas.   We did discuss not driving during the last visit, but I don't think it was fully absorbed so we will discuss at the upcoming appt again.  I think it's ok for her to come to that appt on her own this time.

## 2019-08-16 NOTE — Telephone Encounter (Signed)
Patient called and wanted to know what Gel Dr. Mariea Clonts recommended her to use for her shoulder.  Reviewed last OV note and informed patient:  Assessment/Plan 1. Chronic right shoulder pain - unresolved, she had decreased ROM  -recommend increasing tylenol to 1000 mg twice daily for pain - Ivis Nicolson start voltaren gel OTC - order DG shoulder right

## 2019-08-21 ENCOUNTER — Other Ambulatory Visit: Payer: Self-pay

## 2019-08-21 ENCOUNTER — Ambulatory Visit (INDEPENDENT_AMBULATORY_CARE_PROVIDER_SITE_OTHER): Payer: Medicare PPO | Admitting: Internal Medicine

## 2019-08-21 ENCOUNTER — Encounter: Payer: Self-pay | Admitting: Internal Medicine

## 2019-08-21 VITALS — BP 128/78 | HR 78 | Temp 97.5°F | Ht 64.0 in | Wt 154.0 lb

## 2019-08-21 DIAGNOSIS — M19011 Primary osteoarthritis, right shoulder: Secondary | ICD-10-CM

## 2019-08-21 DIAGNOSIS — G7241 Inclusion body myositis [IBM]: Secondary | ICD-10-CM | POA: Diagnosis not present

## 2019-08-21 DIAGNOSIS — M8580 Other specified disorders of bone density and structure, unspecified site: Secondary | ICD-10-CM | POA: Diagnosis not present

## 2019-08-21 DIAGNOSIS — K209 Esophagitis, unspecified without bleeding: Secondary | ICD-10-CM | POA: Diagnosis not present

## 2019-08-21 DIAGNOSIS — G3184 Mild cognitive impairment, so stated: Secondary | ICD-10-CM

## 2019-08-21 MED ORDER — CALCIUM CARBONATE-VITAMIN D3 600-400 MG-UNIT PO TABS: 1.0000 | ORAL_TABLET | Freq: Two times a day (BID) | ORAL | 11 refills | Status: DC

## 2019-08-21 NOTE — Patient Instructions (Addendum)
Maximum tylenol you may take is 3000mg  per day.    I update your medication list.    Mio flavoring for water.    We will start you on some therapy at home for your right shoulder pain.

## 2019-08-21 NOTE — Progress Notes (Signed)
Location:  [facility] clinic Provider:  Zevin Nevares L. Mariea Clonts, D.O., C.M.D.  Code Status: DNR Goals of Care:  Advanced Directives 08/21/2019  Does Patient Have a Medical Advance Directive? No  Type of Advance Directive -  Does patient want to make changes to medical advance directive? -  Copy of [place] in Chart? -  Would patient like information on creating a medical advance directive? No - Patient declined     Chief Complaint  Patient presents with  . Medical Management of Chronic Issues    follow up on medication, anxieyt/insomnia periodically, Right arm shoulder pain     HPI: Patient is a 79 y.o. female seen today for confirmation of current meds, decreasing medications due to memory loss, some insomnia and anxiety and f/u on right shoulder pain.    Pt reports bringing empty rather than full bottles for convenience.    Main pill she takes for tylenol--is it the best for her pain.    Counseled on decreasing caffeinated beverages with evening meal--use flavored water which she needs to increased.  Myrbetriq was not helpful.    Reviewed that she should not take generic sleep aid or benadryl.  Discussed melatonin instead.    Reviewed that she is on calcium with D3 and previously took fosamax.  Will need to have another bone density May or beyond.  Has had osteopenia.  Reviewed severe DJD of right glenohumeral joint arthritis:  Discussed topical therapy, tylenol, and steroid injection, therapy.  Past Medical History:  Diagnosis Date  . Alopecia, unspecified   . Breast cancer ([place])    stage 1; right  . Cataracts, bilateral   . Decreased rectal sphincter tone 07/01/12  . Diverticulosis   . Enterocele   . Esophageal stricture 07/01/12  . Hernia   . Hiatal hernia   . Hypertension   . Irritable bowel syndrome   . Lumbago   . Migraine with aura, without mention of intractable migraine without mention of status migrainosus   . Mixed hearing loss, bilateral   .  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 Osteoarthrosis, unspecified whether generalized or localized, unspecified site   . Other and unspecified hyperlipidemia   . Plantar fascial fibromatosis   . Rectal prolapse   . Reflux esophagitis   . Regional enteritis of small intestine ([place])   . Senile osteoporosis   . Spasm of muscle   . Unspecified constipation   . Unspecified hypothyroidism     Past Surgical History:  Procedure Laterality Date  . APPENDECTOMY    . BASAL CELL CARCINOMA EXCISION  1978   neck  . BREAST LUMPECTOMY Right    snbx, apbi  . Cataract removal OD  06/28/2012   . Cataract removal OS  12/13/2012  . dermatolfibroma  2012  . DILATION AND CURETTAGE OF UTERUS    . ENTEROCELE REPAIR    . HAIR TRANSPLANT  2008  . HERNIA REPAIR    . IR FLUORO GUIDE CV LINE RIGHT  07/19/2017  . IR PATIENT EVAL TECH 0-60 MINS  07/29/2017  . IR US GUIDE VASC ACCESS RIGHT  07/19/2017  . OOPHORECTOMY    . strabisumus eye surgery     x 2  . TONSILLECTOMY      Allergies  Allergen Reactions  . Gabapentin Other (See Comments)    Balance disturbance  . Cetacaine [Butamben-Tetracaine-Benzocaine] Other (See Comments)    LOST HER SENSE OF TASTE FOR OVER A MONTH!!!    Outpatient Encounter Medications as  of 08/21/2019  Medication Sig  . acetaminophen (TYLENOL) 500 MG tablet Take 500 mg by mouth every 6 (six) hours as needed for mild pain. Patient states every 8 hours as well  . BYSTOLIC 5 MG tablet TAKE 1 TABLET BY MOUTH EVERY DAY  . diclofenac Sodium (VOLTAREN) 1 % GEL Apply 50 g topically 4 (four) times daily.  . diphenhydrAMINE HCl, Sleep, (SLEEP AID) 25 MG CAPS Take by mouth.  . flutamide (EULEXIN) 125 MG capsule Take 125 mg by mouth 2 (two) times daily.  . hydrocortisone (AQUAPHOR ITCH RELIEF MAX STR) 1 % ointment Apply 1 application topically 2 (two) times daily.  [name] losartan (COZAAR) 100 MG tablet TAKE 1 TABLET BY MOUTH EVERY DAY  . Melatonin 3 MG TABS Take 1 tablet by  mouth as needed.  . methocarbamol (ROBAXIN) 500 MG tablet Take 500 mg by mouth. TAKE 1 TABLET BY MOUTH AT BEDTIME AS NEEDED FOR MUSCLE SPASMS  . mineral oil-hydrophilic petrolatum (AQUAPHOR) ointment Apply topically daily as needed for dry skin. Apply to both leg  . minoxidil (LONITEN) 2.5 MG tablet 1.25mg  by mouth daily  . Multiple Vitamins-Minerals (MULTIVITAMIN & MINERAL PO) Take 1 tablet by mouth daily.  [name] MYRBETRIQ 25 MG TB24 tablet TAKE 1 TABLET BY MOUTH EVERY DAY  . pantoprazole (PROTONIX) 40 MG tablet Take 1 tablet (40 mg total) by mouth every other day.  . Probiotic Product (PROBIOTIC PO) Take 1 tablet by mouth daily.  . Psyllium ([employer]S DAILY FIBER PO) Patient takes 1 tablespoon of powder by mouth daily  . pyridostigmine (MESTINON) 60 MG tablet Take 60 mg by mouth 3 (three) times daily.  . vitamin C (ASCORBIC ACID) 500 MG tablet Take 500 mg by mouth daily.   No facility-administered encounter medications on file as of 08/21/2019.    Review of Systems:  Review of Systems  Constitutional: Negative for chills and fever.  HENT: Negative for congestion, hearing loss and sore throat.   Eyes: Negative for blurred vision.  Respiratory: Negative for shortness of breath.   Cardiovascular: Negative for chest pain, palpitations and leg swelling.  Gastrointestinal: Negative for abdominal pain, constipation and diarrhea.       Has had esophagitis on EGD  Genitourinary: Positive for frequency and urgency. Negative for dysuria.       OAB  Musculoskeletal: Positive for joint pain and myalgias. Negative for falls.  Skin: Negative for itching and rash.  Neurological: Negative for dizziness and loss of consciousness.  Endo/Heme/Allergies: Bruises/bleeds easily.  Psychiatric/Behavioral: Positive for memory loss. Negative for depression. The patient has insomnia. The patient is not nervous/anxious.     Health Maintenance  Topic Date Due  . MAMMOGRAM  03/15/2020  . TETANUS/TDAP  12/06/2021  .  INFLUENZA VACCINE  Completed  . DEXA SCAN  Completed  . PNA vac Low Risk Adult  Completed    Physical Exam: Vitals:   08/21/19 1449  BP: 128/78  Pulse: 78  Temp: (!) 97.5 F (36.4 C)  SpO2: 97%  Weight: 154 lb (69.9 kg)  Height: 5\' 4"  (1.626 m)   Body mass index is 26.43 kg/m. Physical Exam Vitals reviewed.  Constitutional:      General: She is not in acute distress.    Appearance: Normal appearance. She is not ill-appearing or toxic-appearing.  Cardiovascular:     Rate and Rhythm: Normal rate and regular rhythm.     Pulses: Normal pulses.     Heart sounds: Normal heart sounds.  Pulmonary:  Effort: Pulmonary effort is normal.     Breath sounds: Normal breath sounds. No wheezing, rhonchi or rales.  Abdominal:     General: Bowel sounds are normal.  Musculoskeletal:        General: No deformity or signs of injury.     Comments: Right shoulder tenderness and decreased ROM  Skin:    General: Skin is warm and dry.     Coloration: Skin is pale.  Neurological:     General: No focal deficit present.     Mental Status: She is alert.  Psychiatric:        Mood and Affect: Mood normal.     Labs reviewed: Basic Metabolic Panel: Recent Labs    12/27/18 0858  NA 140  K 4.7  CL 102  CO2 29  GLUCOSE 97  BUN 18  CREATININE 0.87  CALCIUM 10.0  TSH 3.47   Liver Function Tests: Recent Labs    12/27/18 0858  AST 17  ALT 16  BILITOT 0.6  PROT 6.7   No results for input(s): LIPASE, AMYLASE in the last 8760 hours. No results for input(s): AMMONIA in the last 8760 hours. CBC: Recent Labs    12/27/18 0858  WBC 4.8  NEUTROABS 3,120  HGB 13.5  HCT 42.0  MCV 97.4  PLT 300   Lipid Panel: Recent Labs    12/27/18 0858  CHOL 187  HDL 42*  LDLCALC 123*  TRIG 117  CHOLHDL 4.5   Lab Results  Component Value Date   HGBA1C 5.7 07/06/2017    Procedures since last visit: DG Shoulder Right  Result Date: 08/16/2019 CLINICAL DATA:  Chronic right shoulder  pain without known injury. EXAM: RIGHT SHOULDER - 2+ VIEW COMPARISON:  None. FINDINGS: There is no evidence of fracture or dislocation. Severe narrowing of right glenohumeral joint is noted. Soft tissues are unremarkable. IMPRESSION: Severe degenerative joint disease of right glenohumeral joint. No acute abnormality seen in the right shoulder. Electronically Signed   By: Marijo Conception M.D.   On: 08/16/2019 08:29    Assessment/Plan 1. Mild cognitive impairment -has difficulty managing meds, appts, advised against driving except when absolutely no alternatives and to close familiar places -medications reviewed in detail today after all bottles and tubes brought in -two sets made:  Take this in one bag with tissue box base in it and NO! On top of other bottles which were in her newspaper bags separately--these are to go to be disposed of since I cannot take and dispose of meds here  2. Osteopenia, unspecified location - needs next bone density in may - should go on med if in osteoporotic range (took fosamax for many years and has been on drug holiday and had esophagitis on egd so not a good option) - Calcium Carbonate-Vitamin D3 600-400 MG-UNIT TABS; Take 1 tablet by mouth 2 (two) times daily.  Dispense: 60 tablet; Refill: 11  3. Arthritis of right shoulder region - severe on xrays - cont tylenol, voltaren gel, heat for pain - referred for home health therapy--agency to contact [facility] (in [place]) to arrange, also needs home safety eval as part of assessment - Ambulatory referral to [place]  4. Inclusion body myositis (IBM) -primarily with her dropped head and not much in the way of LE weakness which typically comes with this  5. Esophagitis -avoid nsaids, bisphosphonates -use protonix qod to prevent progression  Labs/tests ordered:  No new; [facility] Next appt:  6 wk f/u (not made when  she left); AWV is scheduled for 6/8 but she may be in [place] by then  [place]. Shayona Hibbitts,  D.O. [facility] [address], [place] [address] Cell Phone (Mon-Fri 8am-5pm):  [phone] On Call:  [phone] & follow prompts after 5pm & weekends Office Phone:  [phone] Office Fax:  [phone]

## 2019-09-15 DIAGNOSIS — G3184 Mild cognitive impairment, so stated: Secondary | ICD-10-CM | POA: Diagnosis not present

## 2019-09-15 DIAGNOSIS — K208 Other esophagitis without bleeding: Secondary | ICD-10-CM | POA: Diagnosis not present

## 2019-09-15 DIAGNOSIS — M8580 Other specified disorders of bone density and structure, unspecified site: Secondary | ICD-10-CM | POA: Diagnosis not present

## 2019-09-15 DIAGNOSIS — I10 Essential (primary) hypertension: Secondary | ICD-10-CM | POA: Diagnosis not present

## 2019-09-15 DIAGNOSIS — F419 Anxiety disorder, unspecified: Secondary | ICD-10-CM | POA: Diagnosis not present

## 2019-09-15 DIAGNOSIS — G4700 Insomnia, unspecified: Secondary | ICD-10-CM | POA: Diagnosis not present

## 2019-09-15 DIAGNOSIS — M19011 Primary osteoarthritis, right shoulder: Secondary | ICD-10-CM | POA: Diagnosis not present

## 2019-09-15 DIAGNOSIS — G7241 Inclusion body myositis [IBM]: Secondary | ICD-10-CM | POA: Diagnosis not present

## 2019-09-19 DIAGNOSIS — I10 Essential (primary) hypertension: Secondary | ICD-10-CM | POA: Diagnosis not present

## 2019-09-19 DIAGNOSIS — F419 Anxiety disorder, unspecified: Secondary | ICD-10-CM | POA: Diagnosis not present

## 2019-09-19 DIAGNOSIS — G7241 Inclusion body myositis [IBM]: Secondary | ICD-10-CM | POA: Diagnosis not present

## 2019-09-19 DIAGNOSIS — M8580 Other specified disorders of bone density and structure, unspecified site: Secondary | ICD-10-CM | POA: Diagnosis not present

## 2019-09-19 DIAGNOSIS — G4700 Insomnia, unspecified: Secondary | ICD-10-CM | POA: Diagnosis not present

## 2019-09-19 DIAGNOSIS — G3184 Mild cognitive impairment, so stated: Secondary | ICD-10-CM | POA: Diagnosis not present

## 2019-09-19 DIAGNOSIS — K208 Other esophagitis without bleeding: Secondary | ICD-10-CM | POA: Diagnosis not present

## 2019-09-19 DIAGNOSIS — M19011 Primary osteoarthritis, right shoulder: Secondary | ICD-10-CM | POA: Diagnosis not present

## 2019-09-21 ENCOUNTER — Telehealth: Payer: Self-pay | Admitting: *Deleted

## 2019-09-21 DIAGNOSIS — G4700 Insomnia, unspecified: Secondary | ICD-10-CM | POA: Diagnosis not present

## 2019-09-21 DIAGNOSIS — G7241 Inclusion body myositis [IBM]: Secondary | ICD-10-CM | POA: Diagnosis not present

## 2019-09-21 DIAGNOSIS — G3184 Mild cognitive impairment, so stated: Secondary | ICD-10-CM | POA: Diagnosis not present

## 2019-09-21 DIAGNOSIS — K208 Other esophagitis without bleeding: Secondary | ICD-10-CM | POA: Diagnosis not present

## 2019-09-21 DIAGNOSIS — F419 Anxiety disorder, unspecified: Secondary | ICD-10-CM | POA: Diagnosis not present

## 2019-09-21 DIAGNOSIS — I10 Essential (primary) hypertension: Secondary | ICD-10-CM | POA: Diagnosis not present

## 2019-09-21 DIAGNOSIS — M8580 Other specified disorders of bone density and structure, unspecified site: Secondary | ICD-10-CM | POA: Diagnosis not present

## 2019-09-21 DIAGNOSIS — M19011 Primary osteoarthritis, right shoulder: Secondary | ICD-10-CM | POA: Diagnosis not present

## 2019-09-21 NOTE — Telephone Encounter (Signed)
She has mild dementia.  It is sounding like a lot of the problem is her hoarding tendency.  I do think it's best for her to move to an [place] facility closer to family.  As far as the medications go, it sounds like she needs a CNA/caregiver in the home to remind her to take her medications at the proper times if the pillboxes or pillpacks have not been a success.  [facility] provides in-home care.  Their number is [phone].  This could be a temporary measure until she moves.

## 2019-09-21 NOTE — Telephone Encounter (Signed)
Sister, [name] notified and agreed.

## 2019-09-21 NOTE — Telephone Encounter (Signed)
Sister, Lazarus Gowda called and stated that patient is getting more difficult to work with. Stated that patient has declined a lot. Stated that patient is moving closer to her, stated that she has placed a deposit on an assisted living place. Patient is wanting to stop the move but sister states that is not an option. 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 Sister doesn't even think it is safe for patient to be taking her medications. Sister is starting to think she doesn't want to move her if it is going to contribute to her trauma, just wants her safe.  Wonders if patient needs to be taking something for her anxiety or should she not even make the move.  Sister is wanting to speak with you regarding her stage of Dementia. [phone]  Please Advise.

## 2019-09-22 ENCOUNTER — Telehealth: Payer: Self-pay | Admitting: *Deleted

## 2019-09-22 DIAGNOSIS — M8580 Other specified disorders of bone density and structure, unspecified site: Secondary | ICD-10-CM | POA: Diagnosis not present

## 2019-09-22 DIAGNOSIS — I10 Essential (primary) hypertension: Secondary | ICD-10-CM | POA: Diagnosis not present

## 2019-09-22 DIAGNOSIS — G4700 Insomnia, unspecified: Secondary | ICD-10-CM | POA: Diagnosis not present

## 2019-09-22 DIAGNOSIS — F419 Anxiety disorder, unspecified: Secondary | ICD-10-CM | POA: Diagnosis not present

## 2019-09-22 DIAGNOSIS — K208 Other esophagitis without bleeding: Secondary | ICD-10-CM | POA: Diagnosis not present

## 2019-09-22 DIAGNOSIS — G3184 Mild cognitive impairment, so stated: Secondary | ICD-10-CM | POA: Diagnosis not present

## 2019-09-22 DIAGNOSIS — G7241 Inclusion body myositis [IBM]: Secondary | ICD-10-CM | POA: Diagnosis not present

## 2019-09-22 DIAGNOSIS — M19011 Primary osteoarthritis, right shoulder: Secondary | ICD-10-CM | POA: Diagnosis not present

## 2019-09-22 NOTE — Telephone Encounter (Signed)
Radovan with [facility] called requesting verbal orders for OT 1x2, 2x4 Verbal orders given.

## 2019-09-26 DIAGNOSIS — M19011 Primary osteoarthritis, right shoulder: Secondary | ICD-10-CM | POA: Diagnosis not present

## 2019-09-26 DIAGNOSIS — F419 Anxiety disorder, unspecified: Secondary | ICD-10-CM | POA: Diagnosis not present

## 2019-09-26 DIAGNOSIS — G3184 Mild cognitive impairment, so stated: Secondary | ICD-10-CM | POA: Diagnosis not present

## 2019-09-26 DIAGNOSIS — M8580 Other specified disorders of bone density and structure, unspecified site: Secondary | ICD-10-CM | POA: Diagnosis not present

## 2019-09-26 DIAGNOSIS — K208 Other esophagitis without bleeding: Secondary | ICD-10-CM | POA: Diagnosis not present

## 2019-09-26 DIAGNOSIS — G4700 Insomnia, unspecified: Secondary | ICD-10-CM | POA: Diagnosis not present

## 2019-09-26 DIAGNOSIS — G7241 Inclusion body myositis [IBM]: Secondary | ICD-10-CM | POA: Diagnosis not present

## 2019-09-26 DIAGNOSIS — I10 Essential (primary) hypertension: Secondary | ICD-10-CM | POA: Diagnosis not present

## 2019-09-27 ENCOUNTER — Ambulatory Visit (INDEPENDENT_AMBULATORY_CARE_PROVIDER_SITE_OTHER): Payer: Medicare PPO | Admitting: Family

## 2019-09-27 ENCOUNTER — Encounter: Payer: Self-pay | Admitting: Family

## 2019-09-27 ENCOUNTER — Other Ambulatory Visit: Payer: Self-pay

## 2019-09-27 VITALS — BP 130/80 | HR 62 | Temp 97.6°F | Ht 64.0 in | Wt 150.0 lb

## 2019-09-27 DIAGNOSIS — G3184 Mild cognitive impairment, so stated: Secondary | ICD-10-CM | POA: Diagnosis not present

## 2019-09-27 DIAGNOSIS — M25511 Pain in right shoulder: Secondary | ICD-10-CM | POA: Diagnosis not present

## 2019-09-27 DIAGNOSIS — F419 Anxiety disorder, unspecified: Secondary | ICD-10-CM | POA: Diagnosis not present

## 2019-09-27 DIAGNOSIS — G4700 Insomnia, unspecified: Secondary | ICD-10-CM | POA: Diagnosis not present

## 2019-09-27 DIAGNOSIS — M19011 Primary osteoarthritis, right shoulder: Secondary | ICD-10-CM | POA: Diagnosis not present

## 2019-09-27 DIAGNOSIS — M8580 Other specified disorders of bone density and structure, unspecified site: Secondary | ICD-10-CM | POA: Diagnosis not present

## 2019-09-27 DIAGNOSIS — I8393 Asymptomatic varicose veins of bilateral lower extremities: Secondary | ICD-10-CM | POA: Diagnosis not present

## 2019-09-27 DIAGNOSIS — G8929 Other chronic pain: Secondary | ICD-10-CM | POA: Diagnosis not present

## 2019-09-27 DIAGNOSIS — I872 Venous insufficiency (chronic) (peripheral): Secondary | ICD-10-CM

## 2019-09-27 DIAGNOSIS — K208 Other esophagitis without bleeding: Secondary | ICD-10-CM | POA: Diagnosis not present

## 2019-09-27 DIAGNOSIS — I10 Essential (primary) hypertension: Secondary | ICD-10-CM | POA: Diagnosis not present

## 2019-09-27 DIAGNOSIS — G7241 Inclusion body myositis [IBM]: Secondary | ICD-10-CM | POA: Diagnosis not present

## 2019-09-27 NOTE — Patient Instructions (Signed)
-   wear compression stockings on in the morning and off at bedtime - Keep legs elevated

## 2019-09-27 NOTE — Progress Notes (Signed)
Provider: Zayveon Raschke FNP-C  Gayland Curry, DO  Patient Care Team: Gayland Curry, DO as PCP - General (Geriatric Medicine) Loney Loh, MD (Dermatology) Lafayette Dragon, MD (Inactive) as Consulting Physician (Gastroenterology) Magrinat, Virgie Dad, MD as Consulting Physician (Oncology) Shon Hough, MD as Consulting Physician (Ophthalmology) Leta Baptist, MD as Consulting Physician (Otolaryngology) Rolm Bookbinder, MD as Consulting Physician (General Surgery) Irene Shipper, MD as Consulting Physician (Gastroenterology) Blima Rich, MD as Referring Physician (Obstetrics and Gynecology)  Extended Emergency Contact Information Primary Emergency Contact: [facility] Phone: [phone] Mobile Phone: [phone] Relation: Sister  Code Status:  Full Code Goals of care: Advanced Directive information Advanced Directives 08/21/2019  Does Patient Have a Medical Advance Directive? No  Type of Advance Directive -  Does patient want to make changes to medical advance directive? -  Copy of [place] in Chart? -  Would patient like information on creating a medical advance directive? No - Patient declined     Chief Complaint  Patient presents with  . Acute Visit    legs red swollen    HPI:  Pt is a 79 y.o. female seen today for an acute visit for evaluation of legs red swollen.Her sister [name] is on patient's phone on video provided additional information.Patient states physical Therapy was working with her yesterday told her legs were swollen and red advised her to follow up with PCP.she has not noticed any swelling herself. She denies any cough,abrupt weight gain,shortness of breath or wheezing.she has a significant medical history of chronic venous insufficiency.   Right shoulder pain - states has trouble trying to dress her self due to pain but has improved with Physical Therapy. Has Voltaren gel inquires how often she should apply. Per PCP  orders patient to apply gel four times daily.states prefers to use twice daily as needed.   Past Medical History:  Diagnosis Date  . Alopecia, unspecified   . Breast cancer ([place])    stage 1; right  . Cataracts, bilateral   . Decreased rectal sphincter tone 07/01/12  . Diverticulosis   . Enterocele   . Esophageal stricture 07/01/12  . Hernia   . Hiatal hernia   . Hypertension   . Irritable bowel syndrome   . Lumbago   . Migraine with aura, without mention of intractable migraine without mention of status migrainosus   . Mixed hearing loss, bilateral   . 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 Osteoarthrosis, unspecified whether generalized or localized, unspecified site   . Other and unspecified hyperlipidemia   . Plantar fascial fibromatosis   . Rectal prolapse   . Reflux esophagitis   . Regional enteritis of small intestine ([place])   . Senile osteoporosis   . Spasm of muscle   . Unspecified constipation   . Unspecified hypothyroidism    Past Surgical History:  Procedure Laterality Date  . APPENDECTOMY    . BASAL CELL CARCINOMA EXCISION  1978   neck  . BREAST LUMPECTOMY Right    snbx, apbi  . Cataract removal OD  06/28/2012   . Cataract removal OS  12/13/2012  . dermatolfibroma  2012  . DILATION AND CURETTAGE OF UTERUS    . ENTEROCELE REPAIR    . HAIR TRANSPLANT  2008  . HERNIA REPAIR    . IR FLUORO GUIDE CV LINE RIGHT  07/19/2017  . IR PATIENT EVAL TECH 0-60 MINS  07/29/2017  . IR US GUIDE VASC ACCESS RIGHT  07/19/2017  . OOPHORECTOMY    .  strabisumus eye surgery     x 2  . TONSILLECTOMY      Allergies  Allergen Reactions  . Gabapentin Other (See Comments)    Balance disturbance  . Cetacaine [Butamben-Tetracaine-Benzocaine] Other (See Comments)    LOST HER SENSE OF TASTE FOR OVER A MONTH!!!    Outpatient Encounter Medications as of 09/27/2019  Medication Sig  . acetaminophen (TYLENOL) 500 MG tablet Take 1,000 mg by mouth 2 (two) times daily as  needed for mild pain.  [name] BYSTOLIC 5 MG tablet TAKE 1 TABLET BY MOUTH EVERY DAY  . Calcium Carbonate-Vitamin D3 600-400 MG-UNIT TABS Take 1 tablet by mouth 2 (two) times daily.  . diclofenac Sodium (VOLTAREN) 1 % GEL Apply 50 g topically 4 (four) times daily. To right shoulder  . flutamide (EULEXIN) 125 MG capsule Take 125 mg by mouth 2 (two) times daily. For hair loss  . hydrocortisone (AQUAPHOR ITCH RELIEF MAX STR) 1 % ointment Apply 1 application topically 2 (two) times daily.  [name] losartan (COZAAR) 100 MG tablet TAKE 1 TABLET BY MOUTH EVERY DAY  . Melatonin 3 MG TABS Take 1 tablet by mouth as needed.  . mineral oil-hydrophilic petrolatum (AQUAPHOR) ointment Apply topically daily as needed for dry skin. Apply to both leg  . minoxidil (LONITEN) 2.5 MG tablet 1.25mg  by mouth daily  . pantoprazole (PROTONIX) 40 MG tablet Take 1 tablet (40 mg total) by mouth every other day.  . vitamin C (ASCORBIC ACID) 500 MG tablet Take 500 mg by mouth daily.   No facility-administered encounter medications on file as of 09/27/2019.    Review of Systems  Constitutional: Negative for appetite change, chills, fatigue and fever.  Respiratory: Negative for cough, chest tightness, shortness of breath and wheezing.   Cardiovascular: Negative for chest pain, palpitations and leg swelling.  Gastrointestinal: Negative for abdominal distention, abdominal pain, constipation, diarrhea, nausea and vomiting.  Genitourinary: Negative for difficulty urinating, dysuria, flank pain, frequency and urgency.  Musculoskeletal: Positive for arthralgias. Negative for gait problem.       Physical Therapy for right shoulder pain   Skin: Negative for color change, pallor and rash.  Neurological: Negative for dizziness, speech difficulty, weakness, light-headedness, numbness and headaches.  Hematological: Does not bruise/bleed easily.  Psychiatric/Behavioral: Negative for agitation and sleep disturbance. The patient is not  nervous/anxious.     Immunization History  Administered Date(s) Administered  . DTaP 06/08/1996  . Fluad Quad(high Dose 65+) 02/09/2019  . Hepatitis A 10/18/1996, 11/15/1997  . Influenza, High Dose Seasonal PF 03/13/2017, 03/29/2018  . Influenza,inj,Quad PF,6+ Mos 03/01/2013, 03/02/2014, 03/05/2015, 04/14/2016  . Influenza-Unspecified 04/07/2016  . Pneumococcal Conjugate-13 09/10/2015  . Pneumococcal-Unspecified 05/07/2010  . Td 09/04/1981, 02/11/1993, 12/07/2011  . Tdap 12/07/2011  . Yellow Fever 11/08/1987  . Zoster 11/30/2006  . Zoster Recombinat (Shingrix) 06/09/2017   Pertinent  Health Maintenance Due  Topic Date Due  . INFLUENZA VACCINE  01/07/2020  . MAMMOGRAM  03/15/2020  . DEXA SCAN  Completed  . PNA vac Low Risk Adult  Completed   Fall Risk  08/21/2019 08/14/2019 04/28/2019 03/16/2019 03/15/2019  Falls in the past year? 0 0 0 0 0  Number falls in past yr: 0 0 0 - 0  Injury with Fall? 0 0 0 - 0  Risk for fall due to : - - - - -  Follow up - - - - -    Vitals:   09/27/19 1448  BP: 130/80  Pulse: 62  Temp: 97.6 F (36.4  C)  TempSrc: Tympanic  SpO2: 96%  Weight: 150 lb (68 kg)  Height: 5\' 4"  (1.626 m)   Body mass index is 25.75 kg/m. Physical Exam Vitals reviewed.  Constitutional:      General: She is not in acute distress.    Appearance: She is not ill-appearing.  HENT:     Head: Normocephalic.     Mouth/Throat:     Mouth: Mucous membranes are moist.     Pharynx: Oropharynx is clear. No oropharyngeal exudate or posterior oropharyngeal erythema.  Eyes:     General: No scleral icterus.       Right eye: No discharge.        Left eye: No discharge.     Conjunctiva/sclera: Conjunctivae normal.     Pupils: Pupils are equal, round, and reactive to light.  Cardiovascular:     Rate and Rhythm: Normal rate and regular rhythm.     Pulses: Normal pulses.     Heart sounds: Normal heart sounds. No murmur. No friction rub. No gallop.      Comments: Bilateral  lower extremities multiple dilated and telangiectasia varicosities involving saphenous calf vein  and non-saphenous vein noted .Non-tender to palpation . Pulmonary:     Effort: Pulmonary effort is normal. No respiratory distress.     Breath sounds: Normal breath sounds. No wheezing, rhonchi or rales.  Chest:     Chest wall: No tenderness.  Abdominal:     General: Bowel sounds are normal. There is no distension.     Palpations: Abdomen is soft. There is no mass.     Tenderness: There is no abdominal tenderness. There is no right CVA tenderness, left CVA tenderness, guarding or rebound.  Musculoskeletal:        General: No swelling or tenderness.     Cervical back: Normal range of motion. No rigidity or tenderness.     Right lower leg: No edema.     Comments: Left leg trace edema. Right shoulder limited ROM due to pain.  Lymphadenopathy:     Cervical: No cervical adenopathy.  Skin:    General: Skin is warm and dry.     Coloration: Skin is not pale.     Findings: No bruising, erythema or rash.  Neurological:     Mental Status: She is alert and oriented to person, place, and time.     Cranial Nerves: No cranial nerve deficit.     Sensory: No sensory deficit.     Motor: No weakness.     Gait: Gait normal.  Psychiatric:        Mood and Affect: Mood normal.        Behavior: Behavior normal.        Thought Content: Thought content normal.        Judgment: Judgment normal.    Labs reviewed: Recent Labs    12/27/18 0858  NA 140  K 4.7  CL 102  CO2 29  GLUCOSE 97  BUN 18  CREATININE 0.87  CALCIUM 10.0   Recent Labs    12/27/18 0858  AST 17  ALT 16  BILITOT 0.6  PROT 6.7   Recent Labs    12/27/18 0858  WBC 4.8  NEUTROABS 3,120  HGB 13.5  HCT 42.0  MCV 97.4  PLT 300   Lab Results  Component Value Date   TSH 3.47 12/27/2018   Lab Results  Component Value Date   HGBA1C 5.7 07/06/2017   Lab Results  Component Value Date  CHOL 187 12/27/2018   HDL 42 (L)  12/27/2018   LDLCALC 123 (H) 12/27/2018   TRIG 117 12/27/2018   CHOLHDL 4.5 12/27/2018    Significant Diagnostic Results in last 30 days:  No results found.  Assessment/Plan 1. Asymptomatic varicose veins of both lower extremities Multiple dilated and telangiectasia varicosities involving saphenous calf vein  and non-saphenous vein noted.No pain,heaviness,muscle cramps or itching reported.Left leg trace edema noted.  - Advised to wear Compression stockings on in the morning and off at bedtime.  - instructed to keep legs elevated above heart level when sitting to keep swelling down.  2. Chronic venous insufficiency No claudation,hyperpigmentation or ulceration noted.Advised to wear compression stocking and elevate legs as above.  3. Chronic right shoulder pain - continue on Tylenol 1000 mg tablet twice daily as needed and Voltaren gel - continue with Physical Therapy.   Family/ staff Communication: Reviewed plan of care with patient verbalized understanding.   Labs/tests ordered: None   Next Appointment: As needed.  Sandrea Hughs, NP

## 2019-09-28 ENCOUNTER — Telehealth: Payer: Self-pay | Admitting: *Deleted

## 2019-09-28 DIAGNOSIS — F419 Anxiety disorder, unspecified: Secondary | ICD-10-CM | POA: Diagnosis not present

## 2019-09-28 DIAGNOSIS — G4700 Insomnia, unspecified: Secondary | ICD-10-CM | POA: Diagnosis not present

## 2019-09-28 DIAGNOSIS — K208 Other esophagitis without bleeding: Secondary | ICD-10-CM | POA: Diagnosis not present

## 2019-09-28 DIAGNOSIS — G7241 Inclusion body myositis [IBM]: Secondary | ICD-10-CM | POA: Diagnosis not present

## 2019-09-28 DIAGNOSIS — G3184 Mild cognitive impairment, so stated: Secondary | ICD-10-CM | POA: Diagnosis not present

## 2019-09-28 DIAGNOSIS — M19011 Primary osteoarthritis, right shoulder: Secondary | ICD-10-CM | POA: Diagnosis not present

## 2019-09-28 DIAGNOSIS — I10 Essential (primary) hypertension: Secondary | ICD-10-CM | POA: Diagnosis not present

## 2019-09-28 DIAGNOSIS — M8580 Other specified disorders of bone density and structure, unspecified site: Secondary | ICD-10-CM | POA: Diagnosis not present

## 2019-09-28 NOTE — Telephone Encounter (Signed)
Spoke with OT therapist and advised results

## 2019-09-28 NOTE — Telephone Encounter (Signed)
Asking for verbal order for medication management for home health nurse to come assist pt with medication.

## 2019-09-28 NOTE — Telephone Encounter (Signed)
That's fine

## 2019-09-29 DIAGNOSIS — F419 Anxiety disorder, unspecified: Secondary | ICD-10-CM | POA: Diagnosis not present

## 2019-09-29 DIAGNOSIS — M19011 Primary osteoarthritis, right shoulder: Secondary | ICD-10-CM | POA: Diagnosis not present

## 2019-09-29 DIAGNOSIS — G3184 Mild cognitive impairment, so stated: Secondary | ICD-10-CM | POA: Diagnosis not present

## 2019-09-29 DIAGNOSIS — G7241 Inclusion body myositis [IBM]: Secondary | ICD-10-CM | POA: Diagnosis not present

## 2019-09-29 DIAGNOSIS — G4700 Insomnia, unspecified: Secondary | ICD-10-CM | POA: Diagnosis not present

## 2019-09-29 DIAGNOSIS — M8580 Other specified disorders of bone density and structure, unspecified site: Secondary | ICD-10-CM | POA: Diagnosis not present

## 2019-09-29 DIAGNOSIS — I10 Essential (primary) hypertension: Secondary | ICD-10-CM | POA: Diagnosis not present

## 2019-09-29 DIAGNOSIS — K208 Other esophagitis without bleeding: Secondary | ICD-10-CM | POA: Diagnosis not present

## 2019-10-02 DIAGNOSIS — G7241 Inclusion body myositis [IBM]: Secondary | ICD-10-CM | POA: Diagnosis not present

## 2019-10-02 DIAGNOSIS — M8580 Other specified disorders of bone density and structure, unspecified site: Secondary | ICD-10-CM | POA: Diagnosis not present

## 2019-10-02 DIAGNOSIS — F419 Anxiety disorder, unspecified: Secondary | ICD-10-CM | POA: Diagnosis not present

## 2019-10-02 DIAGNOSIS — K208 Other esophagitis without bleeding: Secondary | ICD-10-CM | POA: Diagnosis not present

## 2019-10-02 DIAGNOSIS — G4700 Insomnia, unspecified: Secondary | ICD-10-CM | POA: Diagnosis not present

## 2019-10-02 DIAGNOSIS — G3184 Mild cognitive impairment, so stated: Secondary | ICD-10-CM | POA: Diagnosis not present

## 2019-10-02 DIAGNOSIS — I10 Essential (primary) hypertension: Secondary | ICD-10-CM | POA: Diagnosis not present

## 2019-10-02 DIAGNOSIS — M19011 Primary osteoarthritis, right shoulder: Secondary | ICD-10-CM | POA: Diagnosis not present

## 2019-10-04 DIAGNOSIS — G7241 Inclusion body myositis [IBM]: Secondary | ICD-10-CM | POA: Diagnosis not present

## 2019-10-04 DIAGNOSIS — G3184 Mild cognitive impairment, so stated: Secondary | ICD-10-CM | POA: Diagnosis not present

## 2019-10-04 DIAGNOSIS — G4700 Insomnia, unspecified: Secondary | ICD-10-CM | POA: Diagnosis not present

## 2019-10-04 DIAGNOSIS — K208 Other esophagitis without bleeding: Secondary | ICD-10-CM | POA: Diagnosis not present

## 2019-10-04 DIAGNOSIS — F419 Anxiety disorder, unspecified: Secondary | ICD-10-CM | POA: Diagnosis not present

## 2019-10-04 DIAGNOSIS — I10 Essential (primary) hypertension: Secondary | ICD-10-CM | POA: Diagnosis not present

## 2019-10-04 DIAGNOSIS — M19011 Primary osteoarthritis, right shoulder: Secondary | ICD-10-CM | POA: Diagnosis not present

## 2019-10-04 DIAGNOSIS — M8580 Other specified disorders of bone density and structure, unspecified site: Secondary | ICD-10-CM | POA: Diagnosis not present

## 2019-10-05 ENCOUNTER — Telehealth: Payer: Self-pay

## 2019-10-05 ENCOUNTER — Telehealth: Payer: Self-pay | Admitting: Internal Medicine

## 2019-10-05 ENCOUNTER — Ambulatory Visit (INDEPENDENT_AMBULATORY_CARE_PROVIDER_SITE_OTHER): Payer: Medicare PPO | Admitting: Family

## 2019-10-05 ENCOUNTER — Other Ambulatory Visit: Payer: Self-pay

## 2019-10-05 ENCOUNTER — Encounter: Payer: Self-pay | Admitting: Family

## 2019-10-05 DIAGNOSIS — G3184 Mild cognitive impairment, so stated: Secondary | ICD-10-CM | POA: Diagnosis not present

## 2019-10-05 DIAGNOSIS — F419 Anxiety disorder, unspecified: Secondary | ICD-10-CM | POA: Diagnosis not present

## 2019-10-05 DIAGNOSIS — R29898 Other symptoms and signs involving the musculoskeletal system: Secondary | ICD-10-CM

## 2019-10-05 DIAGNOSIS — K208 Other esophagitis without bleeding: Secondary | ICD-10-CM | POA: Diagnosis not present

## 2019-10-05 DIAGNOSIS — M542 Cervicalgia: Secondary | ICD-10-CM | POA: Diagnosis not present

## 2019-10-05 DIAGNOSIS — G7241 Inclusion body myositis [IBM]: Secondary | ICD-10-CM | POA: Diagnosis not present

## 2019-10-05 DIAGNOSIS — M8580 Other specified disorders of bone density and structure, unspecified site: Secondary | ICD-10-CM | POA: Diagnosis not present

## 2019-10-05 DIAGNOSIS — G4700 Insomnia, unspecified: Secondary | ICD-10-CM | POA: Diagnosis not present

## 2019-10-05 DIAGNOSIS — I10 Essential (primary) hypertension: Secondary | ICD-10-CM | POA: Diagnosis not present

## 2019-10-05 DIAGNOSIS — M19011 Primary osteoarthritis, right shoulder: Secondary | ICD-10-CM | POA: Diagnosis not present

## 2019-10-05 NOTE — Telephone Encounter (Signed)
I called patient to begin visit. Patient states she's about to eat breakfast, so call her back in about 15-20 minutes. I will attempt to call patient again shortly.

## 2019-10-05 NOTE — Patient Instructions (Addendum)
Cervical Sprain  A cervical sprain is a stretch or tear in one or more of the tough, cord-like tissues that connect bones (ligaments) in the neck. Cervical sprains can range from mild to severe. Severe cervical sprains can cause the spinal bones (vertebrae) in the neck to be unstable. This can lead to spinal cord damage and can result in serious nervous system problems. The amount of time that it takes for a cervical sprain to get better depends on the cause and extent of the injury. Most cervical sprains heal in 4-6 weeks. What are the causes? Cervical sprains may be caused by an injury (trauma), such as from a motor vehicle accident, a fall, or sudden forward and backward whipping movement of the head and neck (whiplash injury). Mild cervical sprains may be caused by wear and tear over time, such as from poor posture, sitting in a chair that does not provide support, or looking up or down for long periods of time. What increases the risk? The following factors may make you more likely to develop this condition:  Participating in activities that have a high risk of trauma to the neck. These include contact sports, auto racing, gymnastics, and diving.  Taking risks when driving or riding in a motor vehicle, such as speeding.  Having osteoarthritis of the spine.  Having poor strength and flexibility of the neck.  A previous neck injury.  Having poor posture.  Spending a lot of time in certain positions that put stress on the neck, such as sitting at a computer for long periods of time. What are the signs or symptoms? Symptoms of this condition include:  Pain, soreness, stiffness, tenderness, swelling, or a burning sensation in the front, back, or sides of the neck.  Sudden tightening of neck muscles that you cannot control (muscle spasms).  Pain in the shoulders or upper back.  Limited ability to move the neck.  Headache.  Dizziness.  Nausea.  Vomiting.  Weakness, numbness,  or tingling in a hand or an arm. Symptoms may develop right away after injury, or they may develop over a few days. In some cases, symptoms may go away with treatment and return (recur) over time. How is this diagnosed? This condition may be diagnosed based on:  Your medical history.  Your symptoms.  Any recent injuries or known neck problems that you have, such as arthritis in the neck.  A physical exam.  Imaging tests, such as: ? X-rays. ? MRI. ? CT scan. How is this treated? This condition is treated by resting and icing the injured area and doing physical therapy exercises. Depending on the severity of your condition, treatment may also include:  Keeping your neck in place (immobilized) for periods of time. This may be done using: ? A cervical collar. This supports your chin and the back of your head. ? A cervical traction device. This is a sling that holds up your head. This removes weight and pressure from your neck, and it may help to relieve pain.  Medicines that help to relieve pain and inflammation.  Medicines that help to relax your muscles (muscle relaxants).  Surgery. This is rare. Follow these instructions at home: If you have a cervical collar:   Wear it as told by your health care provider. Do not remove the collar unless instructed by your health care provider.  Ask your health care provider before you make any adjustments to your collar.  If you have long hair, keep it outside of the   collar.  Ask your health care provider if you can remove the collar for cleaning and bathing. If you are allowed to remove the collar for cleaning or bathing: ? Follow instructions from your health care provider about how to remove the collar safely. ? Clean the collar by wiping it with mild soap and water and drying it completely. ? If your collar has removable pads, remove them every 1-2 days and wash them by hand with soap and water. Let them air-dry completely before you  put them back in the collar. ? Check your skin under the collar for irritation or sores. If you see any, tell your health care provider. Managing pain, stiffness, and swelling   If directed, use a cervical traction device as told by your health care provider.  If directed, apply heat to the affected area before you do your physical therapy or as often as told by your health care provider. Use the heat source that your health care provider recommends, such as a moist heat pack or a heating pad. ? Place a towel between your skin and the heat source. ? Leave the heat on for 20-30 minutes. ? Remove the heat if your skin turns bright red. This is especially important if you are unable to feel pain, heat, or cold. You may have a greater risk of getting burned.  If directed, put ice on the affected area: ? Put ice in a plastic bag. ? Place a towel between your skin and the bag. ? Leave the ice on for 20 minutes, 2-3 times a day. Activity  Do not drive while wearing a cervical collar. If you do not have a cervical collar, ask your health care provider if it is safe to drive while your neck heals.  Do not drive or use heavy machinery while taking prescription pain medicine or muscle relaxants, unless your health care provider approves.  Do not lift anything that is heavier than 10 lb (4.5 kg) until your health care provider tells you that it is safe.  Rest as directed by your health care provider. Avoid positions and activities that make your symptoms worse. Ask your health care provider what activities are safe for you.  If physical therapy was prescribed, do exercises as told by your health care provider or physical therapist. General instructions  Take over-the-counter and prescription medicines only as told by your health care provider.  Do not use any products that contain nicotine or tobacco, such as cigarettes and e-cigarettes. These can delay healing. If you need help quitting, ask your  health care provider.  Keep all follow-up visits as told by your health care provider or physical therapist. This is important. How is this prevented? To prevent a cervical sprain from happening again:  Use and maintain good posture. Make any needed adjustments to your workstation to help you use good posture.  Exercise regularly as directed by your health care provider or physical therapist.  Avoid risky activities that may cause a cervical sprain. Contact a health care provider if:  You have symptoms that get worse or do not get better after 2 weeks of treatment.  You have pain that gets worse or does not get better with medicine.  You develop new, unexplained symptoms.  You have sores or irritated skin on your neck from wearing your cervical collar. Get help right away if:  You have severe pain.  You develop numbness, tingling, or weakness in any part of your body.  You cannot move   a part of your body (you have paralysis).  You have neck pain along with: ? Severe dizziness. ? Headache. Summary  A cervical sprain is a stretch or tear in one or more of the tough, cord-like tissues that connect bones (ligaments) in the neck.  Cervical sprains may be caused by an injury (trauma), such as from a motor vehicle accident, a fall, or sudden forward and backward whipping movement of the head and neck (whiplash injury).  Symptoms may develop right away after injury, or they may develop over a few days.  This condition is treated by resting and icing the injured area and doing physical therapy exercises. This information is not intended to replace advice given to you by your health care provider. Make sure you discuss any questions you have with your health care provider. Document Revised: 09/14/2018 Document Reviewed: 01/22/2016 Elsevier Patient Education  [place] current medication  - Notify provider's office or call your Neurologist office as you stated if  symptoms worse or fail to improve   Cervical Sprain  A cervical sprain is a stretch or tear in the tissues that connect bones (ligaments) in the neck. Most neck (cervical) sprains get better in 4-6 weeks. Follow these instructions at home: If you have a neck collar:  Wear it as told by your doctor. Do not take off (do not remove) the collar unless your doctor says that this is safe.  Ask your doctor before adjusting your collar.  If you have long hair, keep it outside of the collar.  Ask your doctor if you may take off the collar for cleaning and bathing. If you may take off the collar: ? Follow instructions from your doctor about how to take off the collar safely. ? Clean the collar by wiping it with mild soap and water. Let it air-dry all the way. ? If your collar has removable pads:  Take the pads out every 1-2 days.  Hand wash the pads with soap and water.  Let the pads air-dry all the way before you put them back in the collar. Do not dry them in a clothes dryer. Do not dry them with a hair dryer. ? Check your skin under the collar for irritation or sores. If you see any, tell your doctor. Managing pain, stiffness, and swelling   Use a cervical traction device, if told by your doctor.  If told, put heat on the affected area. Do this before exercises (physical therapy) or as often as told by your doctor. Use the heat source that your doctor recommends, such as a moist heat pack or a heating pad. ? Place a towel between your skin and the heat source. ? Leave the heat on for 20-30 minutes. ? Take the heat off (remove the heat) if your skin turns bright red. This is very important if you cannot feel pain, heat, or cold. You may have a greater risk of getting burned.  Put ice on the affected area. ? Put ice in a plastic bag. ? Place a towel between your skin and the bag. ? Leave the ice on for 20 minutes, 2-3 times a day. Activity  Do not drive while wearing a neck collar. If  you do not have a neck collar, ask your doctor if it is safe to drive.  Do not drive or use heavy machinery while taking prescription pain medicine or muscle relaxants, unless your doctor approves.  Do not lift anything that is heavier than  10 lb (4.5 kg) until your doctor tells you that it is safe.  Rest as told by your doctor.  Avoid activities that make you feel worse. Ask your doctor what activities are safe for you.  Do exercises as told by your doctor or physical therapist. Preventing neck sprain  Practice good posture. Adjust your workstation to help with this, if needed.  Exercise regularly as told by your doctor or physical therapist.  Avoid activities that are risky or may cause a neck sprain (cervical sprain). General instructions  Take over-the-counter and prescription medicines only as told by your doctor.  Do not use any products that contain nicotine or tobacco. This includes cigarettes and e-cigarettes. If you need help quitting, ask your doctor.  Keep all follow-up visits as told by your doctor. This is important. Contact a doctor if:  You have pain or other symptoms that get worse.  You have symptoms that do not get better after 2 weeks.  You have pain that does not get better with medicine.  You start to have new, unexplained symptoms.  You have sores or irritated skin from wearing your neck collar. Get help right away if:  You have very bad pain.  You have any of the following in any part of your body: ? Loss of feeling (numbness). ? Tingling. ? Weakness.  You cannot move a part of your body (you have paralysis).  Your activity level does not improve. Summary  A cervical sprain is a stretch or tear in the tissues that connect bones (ligaments) in the neck.  If you have a neck (cervical) collar, do not take off the collar unless your doctor says that this is safe.  Put ice on affected areas as told by your doctor.  Put heat on affected areas  as told by your doctor.  Good posture and regular exercise can help prevent a neck sprain from happening again. This information is not intended to replace advice given to you by your health care provider. Make sure you discuss any questions you have with your health care provider. Document Revised: 09/14/2018 Document Reviewed: 02/04/2016 Elsevier Patient Education  [place].

## 2019-10-05 NOTE — Progress Notes (Addendum)
This service is provided via telemedicine  No vital signs collected/recorded due to the encounter was a telemedicine visit.   Location of patient (ex: home, work): Home  Patient consents to a telephone visit: Yes  Location of the provider (ex: office, home):  [employer].  Name of any referring provider: N/A  Names of all persons participating in the telemedicine service and their role in the encounter:  Patient, Heriberto Antigua, [place], [place], Webb Silversmith, NP.    Time spent on call: 8 minutes spent on the phone with Medical Assistant.    Provider: Tresia Revolorio FNP-C  Gayland Curry, DO  Patient Care Team: Gayland Curry, DO as PCP - General (Geriatric Medicine) Loney Loh, MD (Dermatology) Lafayette Dragon, MD (Inactive) as Consulting Physician (Gastroenterology) Magrinat, Virgie Dad, MD as Consulting Physician (Oncology) Shon Hough, MD as Consulting Physician (Ophthalmology) Leta Baptist, MD as Consulting Physician (Otolaryngology) Rolm Bookbinder, MD as Consulting Physician (General Surgery) Irene Shipper, MD as Consulting Physician (Gastroenterology) Blima Rich, MD as Referring Physician (Obstetrics and Gynecology)  Extended Emergency Contact Information Primary Emergency Contact: [facility] Phone: [phone] Mobile Phone: [phone] Relation: Sister  Code Status: Full Code  Goals of care: Advanced Directive information Advanced Directives 10/05/2019  Does Patient Have a Medical Advance Directive? Yes  Type of Paramedic of [place];Living will;Out of facility DNR (pink MOST or yellow form)  Does patient want to make changes to medical advance directive? No - Patient declined  Copy of [place] in Chart? No - copy requested  Would patient like information on creating a medical advance directive? -     Chief Complaint  Patient presents with  . Acute Visit    Neck Pain.    HPI:  Pt is  a 79 y.o. female seen today for an acute visit for evaluation of neck pain.Head drop pain was more intense last night.much better this morning after applying heat and body therapy cream recommended by a friend.Also used Tylenol and Voltaren gel last night.Has difficulties turning head to sides especially trying to get to her phone.she denies any sudden tightening of the neck muscle,headache,diziness,nausea,vomiting or pain in the shoulders or upper back.Also denies any weakness,numbness or tingling on hands.she has seen Dr.Patel Donika at [facility] for drop head syndrome.seen last Also 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 She states will call Dr.Patel's office if her symptoms worsen but for now she feels much better with current treatment. Also states will be moving to [place] to be close to family in about 2 -3 months or sooner.  Past Medical History:  Diagnosis Date  . Alopecia, unspecified   . Breast cancer ([place])    stage 1; right  . Cataracts, bilateral   . Decreased rectal sphincter tone 07/01/12  . Diverticulosis   . Enterocele   . Esophageal stricture 07/01/12  . Hernia   . Hiatal hernia   . Hypertension   . Irritable bowel syndrome   . Lumbago   . Migraine with aura, without mention of intractable migraine without mention of status migrainosus   . Mixed hearing loss, bilateral   . 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 Osteoarthrosis, unspecified whether generalized or localized, unspecified site   . Other and unspecified hyperlipidemia   . Plantar fascial fibromatosis   . Rectal prolapse   .  Reflux esophagitis   . Regional enteritis of small intestine ([place])   . Senile osteoporosis   . Spasm of muscle   . Unspecified constipation   . Unspecified hypothyroidism    Past Surgical History:  Procedure Laterality  Date  . APPENDECTOMY    . BASAL CELL CARCINOMA EXCISION  1978   neck  . BREAST LUMPECTOMY Right    snbx, apbi  . Cataract removal OD  06/28/2012   . Cataract removal OS  12/13/2012  . dermatolfibroma  2012  . DILATION AND CURETTAGE OF UTERUS    . ENTEROCELE REPAIR    . HAIR TRANSPLANT  2008  . HERNIA REPAIR    . IR FLUORO GUIDE CV LINE RIGHT  07/19/2017  . IR PATIENT EVAL TECH 0-60 MINS  07/29/2017  . IR US GUIDE VASC ACCESS RIGHT  07/19/2017  . OOPHORECTOMY    . strabisumus eye surgery     x 2  . TONSILLECTOMY      Allergies  Allergen Reactions  . Gabapentin Other (See Comments)    Balance disturbance  . Cetacaine [Butamben-Tetracaine-Benzocaine] Other (See Comments)    LOST HER SENSE OF TASTE FOR OVER A MONTH!!!    Outpatient Encounter Medications as of 10/05/2019  Medication Sig  . acetaminophen (TYLENOL) 500 MG tablet Take 1,000 mg by mouth 2 (two) times daily as needed for mild pain.  [name] BYSTOLIC 5 MG tablet TAKE 1 TABLET BY MOUTH EVERY DAY  . Calcium Carb-Cholecalciferol (CALCIUM-VITAMIN D3) 600-400 MG-UNIT TABS Take 1 tablet by mouth daily.  . diclofenac Sodium (VOLTAREN) 1 % GEL Apply 50 g topically as needed. To right shoulder  . flutamide (EULEXIN) 125 MG capsule Take 125 mg by mouth 2 (two) times daily. For hair loss  . hydrocortisone (AQUAPHOR ITCH RELIEF MAX STR) 1 % ointment Apply 1 application topically as needed.   [name] losartan (COZAAR) 100 MG tablet TAKE 1 TABLET BY MOUTH EVERY DAY  . Melatonin 3 MG TABS Take 1 tablet by mouth as needed.  . mineral oil-hydrophilic petrolatum (AQUAPHOR) ointment Apply topically daily as needed for dry skin. Apply to both leg  . minoxidil (LONITEN) 2.5 MG tablet 1.25mg  by mouth daily  . pantoprazole (PROTONIX) 40 MG tablet Take 1 tablet (40 mg total) by mouth every other day.  . vitamin C (ASCORBIC ACID) 500 MG tablet Take 500 mg by mouth daily.  . [DISCONTINUED] Calcium Carbonate-Vitamin D3 600-400 MG-UNIT TABS Take 1 tablet by  mouth 2 (two) times daily.   No facility-administered encounter medications on file as of 10/05/2019.    Review of Systems  Constitutional: Negative for appetite change, chills, fatigue and fever.  Eyes: Negative for discharge, redness and itching.  Respiratory: Negative for cough, chest tightness, shortness of breath and wheezing.   Cardiovascular: Negative for chest pain, palpitations and leg swelling.  Gastrointestinal: Negative for abdominal distention, abdominal pain, constipation, nausea and vomiting.  Musculoskeletal: Positive for neck pain.  Skin: Negative for color change and rash.  Neurological: Negative for dizziness, weakness, light-headedness, numbness and headaches.  Psychiatric/Behavioral: Negative for agitation and sleep disturbance. The patient is not nervous/anxious.    Immunization History  Administered Date(s) Administered  . DTaP 06/08/1996  . Fluad Quad(high Dose 65+) 02/09/2019  . Hepatitis A 10/18/1996, 11/15/1997  . Influenza, High Dose Seasonal PF 03/13/2017, 03/29/2018  . Influenza,inj,Quad PF,6+ Mos 03/01/2013, 03/02/2014, 03/05/2015, 04/14/2016  . Influenza-Unspecified 04/07/2016  . Pneumococcal Conjugate-13 09/10/2015  . Pneumococcal-Unspecified 05/07/2010  . Td 09/04/1981, 02/11/1993, 12/07/2011  .  Tdap 12/07/2011  . Yellow Fever 11/08/1987  . Zoster 11/30/2006  . Zoster Recombinat (Shingrix) 06/09/2017   Pertinent  Health Maintenance Due  Topic Date Due  . INFLUENZA VACCINE  01/07/2020  . MAMMOGRAM  03/15/2020  . DEXA SCAN  Completed  . PNA vac Low Risk Adult  Completed   Fall Risk  10/05/2019 08/21/2019 08/14/2019 04/28/2019 03/16/2019  Falls in the past year? 0 0 0 0 0  Number falls in past yr: 0 0 0 0 -  Injury with Fall? 0 0 0 0 -  Risk for fall due to : - - - - -  Follow up - - - - -   There were no vitals filed for this visit. There is no height or weight on file to calculate BMI. Physical Exam  Unable to complete on telephone visit.    Labs reviewed: Recent Labs    12/27/18 0858  NA 140  K 4.7  CL 102  CO2 29  GLUCOSE 97  BUN 18  CREATININE 0.87  CALCIUM 10.0   Recent Labs    12/27/18 0858  AST 17  ALT 16  BILITOT 0.6  PROT 6.7   Recent Labs    12/27/18 0858  WBC 4.8  NEUTROABS 3,120  HGB 13.5  HCT 42.0  MCV 97.4  PLT 300   Lab Results  Component Value Date   TSH 3.47 12/27/2018   Lab Results  Component Value Date   HGBA1C 5.7 07/06/2017   Lab Results  Component Value Date   CHOL 187 12/27/2018   HDL 42 (L) 12/27/2018   LDLCALC 123 (H) 12/27/2018   TRIG 117 12/27/2018   CHOLHDL 4.5 12/27/2018    Significant Diagnostic Results in last 30 days:  No results found.  Assessment/Plan 1. Dropped head syndrome Chronic.continue to follow up with Neurologist.  2. Neck pain Pain was worst last night but has improved after taking tylenol,using heat,volateren and body Therapy.Has limited ROM turning to side especially getting to her phone. Plans to move phone to where she can easily reach.Decline referral to her Neurologist Dr.Patel states will call if symptoms worsen.  - Additional Education information provided on AVS for neck pain   Family/ staff Communication: Reviewed plan of care with patient verbalized understanding.   Labs/tests ordered: None   Next Appointment: As needed if symptoms worsen or fail to improve   Spent 14 minutes of non-face to face with patient.  I connected with  Conard Novak on 10/05/19 by a Telephone enabled telemedicine application and verified that I am speaking with the correct person using two identifiers.   I discussed the limitations of evaluation and management by telemedicine. The patient expressed understanding and agreed to proceed.  Sandrea Hughs, NP

## 2019-10-05 NOTE — Telephone Encounter (Signed)
Received and completed form for admission to [facility].  Placed in fax box to be faxed with note for copy to be made to be scanned into her record.  Please call [name], her sister, and notify her that form was done and faxed.

## 2019-10-06 ENCOUNTER — Telehealth: Payer: Self-pay

## 2019-10-06 DIAGNOSIS — F419 Anxiety disorder, unspecified: Secondary | ICD-10-CM | POA: Diagnosis not present

## 2019-10-06 DIAGNOSIS — G7241 Inclusion body myositis [IBM]: Secondary | ICD-10-CM | POA: Diagnosis not present

## 2019-10-06 DIAGNOSIS — G3184 Mild cognitive impairment, so stated: Secondary | ICD-10-CM | POA: Diagnosis not present

## 2019-10-06 DIAGNOSIS — G4700 Insomnia, unspecified: Secondary | ICD-10-CM | POA: Diagnosis not present

## 2019-10-06 DIAGNOSIS — I10 Essential (primary) hypertension: Secondary | ICD-10-CM | POA: Diagnosis not present

## 2019-10-06 DIAGNOSIS — M8580 Other specified disorders of bone density and structure, unspecified site: Secondary | ICD-10-CM | POA: Diagnosis not present

## 2019-10-06 DIAGNOSIS — M19011 Primary osteoarthritis, right shoulder: Secondary | ICD-10-CM | POA: Diagnosis not present

## 2019-10-06 DIAGNOSIS — K208 Other esophagitis without bleeding: Secondary | ICD-10-CM | POA: Diagnosis not present

## 2019-10-06 NOTE — Telephone Encounter (Signed)
Message given to [facility]( sister). [name] is thinking because of the move  Marilyn [name] is extremely stressed and overwhelmed with people being in her home to help with the move she is in more pain and maybe wants to be seen.

## 2019-10-09 ENCOUNTER — Telehealth: Payer: Self-pay

## 2019-10-09 ENCOUNTER — Other Ambulatory Visit: Payer: Self-pay

## 2019-10-09 DIAGNOSIS — M19011 Primary osteoarthritis, right shoulder: Secondary | ICD-10-CM | POA: Diagnosis not present

## 2019-10-09 DIAGNOSIS — G4700 Insomnia, unspecified: Secondary | ICD-10-CM | POA: Diagnosis not present

## 2019-10-09 DIAGNOSIS — Z111 Encounter for screening for respiratory tuberculosis: Secondary | ICD-10-CM

## 2019-10-09 DIAGNOSIS — G3184 Mild cognitive impairment, so stated: Secondary | ICD-10-CM | POA: Diagnosis not present

## 2019-10-09 DIAGNOSIS — I10 Essential (primary) hypertension: Secondary | ICD-10-CM | POA: Diagnosis not present

## 2019-10-09 DIAGNOSIS — K208 Other esophagitis without bleeding: Secondary | ICD-10-CM | POA: Diagnosis not present

## 2019-10-09 DIAGNOSIS — G7241 Inclusion body myositis [IBM]: Secondary | ICD-10-CM | POA: Diagnosis not present

## 2019-10-09 DIAGNOSIS — M8580 Other specified disorders of bone density and structure, unspecified site: Secondary | ICD-10-CM | POA: Diagnosis not present

## 2019-10-09 DIAGNOSIS — F419 Anxiety disorder, unspecified: Secondary | ICD-10-CM | POA: Diagnosis not present

## 2019-10-09 NOTE — Telephone Encounter (Signed)
Called patients sister to schedule appointment for blood work for her TB Test made appointment and gave her the information to be able to schedule appointment for Covid Test for patient she ask that we please call her with results from lab work as she will need this information to resister her sister in the facility where she is going to live

## 2019-10-09 NOTE — Telephone Encounter (Signed)
May order Quantiferon Gold Tb test.Patient will need to go to COVID-19 Testing place or call for appointment.we don't have to call for her.

## 2019-10-09 NOTE — Telephone Encounter (Signed)
Patients sister [name] called to see if she could get her sister an appointment to have blood drawn for a TB test she does not want the injection as she doesn't have a way to get her sister here twice and she wants to know if we can schedule a Covid test for her sister close to May 15 as she will be moving to a a facility and they require these things before she can be admitted Please Advise

## 2019-10-10 ENCOUNTER — Telehealth: Payer: Self-pay

## 2019-10-10 NOTE — Telephone Encounter (Signed)
Patients sister called to change appointment time for TB Blood test as she could not get patient a ride until after 2:00 appointment made

## 2019-10-11 DIAGNOSIS — M19011 Primary osteoarthritis, right shoulder: Secondary | ICD-10-CM | POA: Diagnosis not present

## 2019-10-11 DIAGNOSIS — G7241 Inclusion body myositis [IBM]: Secondary | ICD-10-CM | POA: Diagnosis not present

## 2019-10-11 DIAGNOSIS — K208 Other esophagitis without bleeding: Secondary | ICD-10-CM | POA: Diagnosis not present

## 2019-10-11 DIAGNOSIS — I10 Essential (primary) hypertension: Secondary | ICD-10-CM | POA: Diagnosis not present

## 2019-10-11 DIAGNOSIS — F419 Anxiety disorder, unspecified: Secondary | ICD-10-CM | POA: Diagnosis not present

## 2019-10-11 DIAGNOSIS — G4700 Insomnia, unspecified: Secondary | ICD-10-CM | POA: Diagnosis not present

## 2019-10-11 DIAGNOSIS — M8580 Other specified disorders of bone density and structure, unspecified site: Secondary | ICD-10-CM | POA: Diagnosis not present

## 2019-10-11 DIAGNOSIS — G3184 Mild cognitive impairment, so stated: Secondary | ICD-10-CM | POA: Diagnosis not present

## 2019-10-12 ENCOUNTER — Other Ambulatory Visit: Payer: Self-pay

## 2019-10-12 ENCOUNTER — Ambulatory Visit (INDEPENDENT_AMBULATORY_CARE_PROVIDER_SITE_OTHER): Payer: Medicare PPO | Admitting: Family

## 2019-10-12 ENCOUNTER — Other Ambulatory Visit: Payer: Medicare PPO

## 2019-10-12 ENCOUNTER — Encounter: Payer: Self-pay | Admitting: Family

## 2019-10-12 VITALS — BP 128/62 | HR 62 | Temp 97.7°F | Resp 16 | Ht 64.0 in | Wt 147.7 lb

## 2019-10-12 DIAGNOSIS — Z111 Encounter for screening for respiratory tuberculosis: Secondary | ICD-10-CM

## 2019-10-12 DIAGNOSIS — E785 Hyperlipidemia, unspecified: Secondary | ICD-10-CM | POA: Diagnosis not present

## 2019-10-12 DIAGNOSIS — M542 Cervicalgia: Secondary | ICD-10-CM | POA: Diagnosis not present

## 2019-10-12 DIAGNOSIS — M436 Torticollis: Secondary | ICD-10-CM | POA: Diagnosis not present

## 2019-10-12 MED ORDER — METHOCARBAMOL 500 MG PO TABS: 500.0000 mg | ORAL_TABLET | Freq: Three times a day (TID) | ORAL | 0 refills | Status: AC | PRN

## 2019-10-12 NOTE — Patient Instructions (Signed)
Take Robaxin 500 mg tablet one by mouth every 8 hours as needed for neck stiffness and pain

## 2019-10-12 NOTE — Progress Notes (Signed)
Provider: Dinah Ngetich FNP-C  Gayland Curry, DO  Patient Care Team: Gayland Curry, DO as PCP - General (Geriatric Medicine) Loney Loh, MD (Dermatology) Lafayette Dragon, MD (Inactive) as Consulting Physician (Gastroenterology) Magrinat, Virgie Dad, MD as Consulting Physician (Oncology) Shon Hough, MD as Consulting Physician (Ophthalmology) Leta Baptist, MD as Consulting Physician (Otolaryngology) Rolm Bookbinder, MD as Consulting Physician (General Surgery) Irene Shipper, MD as Consulting Physician (Gastroenterology) Blima Rich, MD as Referring Physician (Obstetrics and Gynecology)  Extended Emergency Contact Information Primary Emergency Contact: [facility] Phone: [phone] Mobile Phone: [phone] Relation: Sister  Code Status:Full Code  Goals of care: Advanced Directive information Advanced Directives 10/12/2019  Does Patient Have a Medical Advance Directive? Yes  Type of Paramedic of [place];Living will;Out of facility DNR (pink MOST or yellow form)  Does patient want to make changes to medical advance directive? No - Patient declined  Copy of [place] in Chart? No - copy requested  Would patient like information on creating a medical advance directive? -     Chief Complaint  Patient presents with  . Acute Visit    Neck Pain    HPI:  Pt is a 79 y.o. 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 She will be moving to [place] close to family at the end of next week.States in the process of packing which is making her neck worst.she sleeps on a recliner.Has a neck brace but has not been wearing.    Past Medical History:  Diagnosis Date  . Alopecia, unspecified   . Breast cancer ([place])    stage 1; right  . Cataracts, bilateral   . Decreased rectal sphincter tone 07/01/12  . Diverticulosis   . Enterocele   . Esophageal stricture 07/01/12  . Hernia   . Hiatal hernia   . Hypertension   . Irritable bowel syndrome   . Lumbago   . Migraine with aura, without mention of intractable migraine without mention of status migrainosus   . Mixed hearing loss, bilateral   . 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 Osteoarthrosis, unspecified whether generalized or localized, unspecified site   . Other and unspecified hyperlipidemia   . Plantar fascial fibromatosis   . Rectal prolapse   . Reflux esophagitis   . Regional enteritis of small intestine ([place])   . Senile osteoporosis   . Spasm of muscle   . Unspecified constipation   . Unspecified hypothyroidism    Past Surgical History:  Procedure Laterality Date  . APPENDECTOMY    . BASAL CELL CARCINOMA EXCISION  1978   neck  . BREAST LUMPECTOMY Right    snbx, apbi  . Cataract removal OD  06/28/2012   . Cataract removal OS  12/13/2012  . dermatolfibroma  2012  . DILATION AND CURETTAGE OF UTERUS    . ENTEROCELE REPAIR    . HAIR TRANSPLANT  2008  .  HERNIA REPAIR    . IR FLUORO GUIDE CV LINE RIGHT  07/19/2017  . IR PATIENT EVAL TECH 0-60 MINS  07/29/2017  . IR US GUIDE VASC ACCESS RIGHT  07/19/2017  . OOPHORECTOMY    . strabisumus eye surgery     x 2  . TONSILLECTOMY      Allergies  Allergen Reactions  . Gabapentin Other (See Comments)    Balance  disturbance  . Cetacaine [Butamben-Tetracaine-Benzocaine] Other (See Comments)    LOST HER SENSE OF TASTE FOR OVER A MONTH!!!    Outpatient Encounter Medications as of 10/12/2019  Medication Sig  . acetaminophen (TYLENOL) 500 MG tablet Take 1,000 mg by mouth 2 (two) times daily as needed for mild pain.  [name] BYSTOLIC 5 MG tablet TAKE 1 TABLET BY MOUTH EVERY DAY  . Calcium Carb-Cholecalciferol (CALCIUM-VITAMIN D3) 600-400 MG-UNIT TABS Take 1 tablet by mouth daily.  . diclofenac Sodium (VOLTAREN) 1 % GEL Apply 50 g topically as needed. To right shoulder  . flutamide (EULEXIN) 125 MG capsule Take 125 mg by mouth 2 (two) times daily. For hair loss  . losartan (COZAAR) 100 MG tablet TAKE 1 TABLET BY MOUTH EVERY DAY  . Melatonin 3 MG TABS Take 1 tablet by mouth as needed.  . minoxidil (LONITEN) 2.5 MG tablet 1.25mg  by mouth daily  . vitamin C (ASCORBIC ACID) 500 MG tablet Take 500 mg by mouth daily.  . [DISCONTINUED] hydrocortisone (AQUAPHOR ITCH RELIEF MAX STR) 1 % ointment Apply 1 application topically as needed.   . [DISCONTINUED] mineral oil-hydrophilic petrolatum (AQUAPHOR) ointment Apply topically daily as needed for dry skin. Apply to both leg  . [DISCONTINUED] pantoprazole (PROTONIX) 40 MG tablet Take 1 tablet (40 mg total) by mouth every other day.   No facility-administered encounter medications on file as of 10/12/2019.    Review of Systems  Constitutional: Negative for chills, fatigue and fever.  Respiratory: Negative for cough, chest tightness, shortness of breath and wheezing.   Cardiovascular: Negative for chest pain, palpitations and leg swelling.  Musculoskeletal: Positive for arthralgias, neck pain and neck stiffness.  Skin: Negative for color change, pallor and rash.  Neurological: Negative for dizziness, speech difficulty, weakness, light-headedness, numbness and headaches.    Immunization History  Administered Date(s) Administered  . DTaP 06/08/1996  . Fluad Quad(high Dose  65+) 02/09/2019  . Hepatitis A 10/18/1996, 11/15/1997  . Influenza, High Dose Seasonal PF 03/13/2017, 03/29/2018  . Influenza,inj,Quad PF,6+ Mos 03/01/2013, 03/02/2014, 03/05/2015, 04/14/2016  . Influenza-Unspecified 04/07/2016  . PFIZER SARS-COV-2 Vaccination 07/21/2019, 08/15/2019  . Pneumococcal Conjugate-13 09/10/2015  . Pneumococcal-Unspecified 05/07/2010  . Td 09/04/1981, 02/11/1993, 12/07/2011  . Tdap 12/07/2011  . Yellow Fever 11/08/1987  . Zoster 11/30/2006  . Zoster Recombinat (Shingrix) 06/09/2017   Pertinent  Health Maintenance Due  Topic Date Due  . INFLUENZA VACCINE  01/07/2020  . MAMMOGRAM  03/15/2020  . DEXA SCAN  Completed  . PNA vac Low Risk Adult  Completed   Fall Risk  10/12/2019 10/05/2019 08/21/2019 08/14/2019 04/28/2019  Falls in the past year? 0 0 0 0 0  Number falls in past yr: 0 0 0 0 0  Injury with Fall? 0 0 0 0 0  Risk for fall due to : - - - - -  Follow up - - - - -    Vitals:   10/12/19 1518  BP: 128/62  Pulse: 62  Resp: 16  Temp: 97.7 F (36.5 C)  SpO2: 97%  Weight: 147 lb 11.2 oz (  67 kg)  Height: 5\' 4"  (1.626 m)   Body mass index is 25.35 kg/m. Physical Exam Vitals reviewed.  Constitutional:      General: She is not in acute distress.    Appearance: She is overweight. She is not ill-appearing.  HENT:     Head: Normocephalic.     Mouth/Throat:     Mouth: Mucous membranes are moist.     Pharynx: Oropharynx is clear. No oropharyngeal exudate.  Eyes:     General: No scleral icterus.       Right eye: No discharge.        Left eye: No discharge.     Conjunctiva/sclera: Conjunctivae normal.     Pupils: Pupils are equal, round, and reactive to light.  Neck:      Comments: Limited ROM worst towards the right.  Cardiovascular:     Rate and Rhythm: Normal rate and regular rhythm.     Pulses: Normal pulses.     Heart sounds: Normal heart sounds. No murmur. No friction rub. No gallop.   Pulmonary:     Effort: Pulmonary effort is normal.  No respiratory distress.     Breath sounds: Normal breath sounds. No wheezing, rhonchi or rales.  Chest:     Chest wall: No tenderness.  Abdominal:     General: Bowel sounds are normal. There is no distension.     Palpations: Abdomen is soft. There is no mass.     Tenderness: There is no abdominal tenderness. There is no right CVA tenderness, left CVA tenderness, guarding or rebound.  Musculoskeletal:     Cervical back: No rigidity.  Skin:    General: Skin is warm.     Coloration: Skin is not pale.     Findings: No bruising, erythema or rash.  Neurological:     Mental Status: She is alert and oriented to person, place, and time.     Cranial Nerves: No cranial nerve deficit.     Motor: No weakness.  Psychiatric:        Mood and Affect: Mood normal.        Behavior: Behavior normal.        Thought Content: Thought content normal.        Judgment: Judgment normal.    Labs reviewed: Recent Labs    12/27/18 0858  NA 140  K 4.7  CL 102  CO2 29  GLUCOSE 97  BUN 18  CREATININE 0.87  CALCIUM 10.0   Recent Labs    12/27/18 0858  AST 17  ALT 16  BILITOT 0.6  PROT 6.7   Recent Labs    12/27/18 0858  WBC 4.8  NEUTROABS 3,120  HGB 13.5  HCT 42.0  MCV 97.4  PLT 300   Lab Results  Component Value Date   TSH 3.47 12/27/2018   Lab Results  Component Value Date   HGBA1C 5.7 07/06/2017   Lab Results  Component Value Date   CHOL 187 12/27/2018   HDL 42 (L) 12/27/2018   LDLCALC 123 (H) 12/27/2018   TRIG 117 12/27/2018   CHOLHDL 4.5 12/27/2018    Significant Diagnostic Results in last 30 days:  No results found.  Assessment/Plan 1. Neck pain Ongoing chronic secondary to drop neck syndrome. - continue on Tylenol,Voltaren gel and heat therapy.will add muscle relaxant.side effects discussed verbalized understanding. - Advised to establish with PCP and Neuro when she moves to [place] next week.  - methocarbamol (ROBAXIN) 500 MG tablet; Take 1 tablet (500  mg  total) by mouth every 8 (eight) hours as needed for muscle spasms.  Dispense: 30 tablet; Refill: 0  2. Neck stiffness Continue current pain regimen as above. - methocarbamol (ROBAXIN) 500 MG tablet; Take 1 tablet (500 mg total) by mouth every 8 (eight) hours as needed for muscle spasms.  Dispense: 30 tablet; Refill: 0  Family/ staff Communication: Reviewed plan of care with patient and friend verbalized understanding.   Labs/tests ordered: None   Next Appointment: as needed if symptoms worsen or fail to improve.  Sandrea Hughs, NP

## 2019-10-13 DIAGNOSIS — F419 Anxiety disorder, unspecified: Secondary | ICD-10-CM | POA: Diagnosis not present

## 2019-10-13 DIAGNOSIS — G7241 Inclusion body myositis [IBM]: Secondary | ICD-10-CM | POA: Diagnosis not present

## 2019-10-13 DIAGNOSIS — M8580 Other specified disorders of bone density and structure, unspecified site: Secondary | ICD-10-CM | POA: Diagnosis not present

## 2019-10-13 DIAGNOSIS — K208 Other esophagitis without bleeding: Secondary | ICD-10-CM | POA: Diagnosis not present

## 2019-10-13 DIAGNOSIS — G3184 Mild cognitive impairment, so stated: Secondary | ICD-10-CM | POA: Diagnosis not present

## 2019-10-13 DIAGNOSIS — I10 Essential (primary) hypertension: Secondary | ICD-10-CM | POA: Diagnosis not present

## 2019-10-13 DIAGNOSIS — G4700 Insomnia, unspecified: Secondary | ICD-10-CM | POA: Diagnosis not present

## 2019-10-13 DIAGNOSIS — M19011 Primary osteoarthritis, right shoulder: Secondary | ICD-10-CM | POA: Diagnosis not present

## 2019-10-14 LAB — LIPID PANEL
Cholesterol: 175 mg/dL (ref <200)
HDL: 44 mg/dL — ABNORMAL LOW (ref >=50)
LDL Cholesterol (Calc): 107 mg/dL (calc) — ABNORMAL HIGH
Non-HDL Cholesterol (Calc): 131 mg/dL (calc) — ABNORMAL HIGH (ref <130)
Total CHOL/HDL Ratio: 4 (calc) (ref <5.0)
Triglycerides: 127 mg/dL (ref <150)

## 2019-10-14 LAB — QUANTIFERON-TB GOLD PLUS
Mitogen-NIL: >10 IU/mL
NIL: 0.03 IU/mL
QuantiFERON-TB Gold Plus: NEGATIVE
TB1-NIL: 0 IU/mL
TB2-NIL: 0.01 IU/mL

## 2019-10-16 ENCOUNTER — Other Ambulatory Visit: Payer: Medicare PPO

## 2019-10-16 ENCOUNTER — Ambulatory Visit: Payer: Medicare PPO | Attending: Internal Medicine

## 2019-10-16 DIAGNOSIS — Z20822 Contact with and (suspected) exposure to covid-19: Secondary | ICD-10-CM | POA: Diagnosis not present

## 2019-10-17 DIAGNOSIS — G3184 Mild cognitive impairment, so stated: Secondary | ICD-10-CM | POA: Diagnosis not present

## 2019-10-17 DIAGNOSIS — M19011 Primary osteoarthritis, right shoulder: Secondary | ICD-10-CM | POA: Diagnosis not present

## 2019-10-17 DIAGNOSIS — M8580 Other specified disorders of bone density and structure, unspecified site: Secondary | ICD-10-CM | POA: Diagnosis not present

## 2019-10-17 DIAGNOSIS — G7241 Inclusion body myositis [IBM]: Secondary | ICD-10-CM | POA: Diagnosis not present

## 2019-10-17 DIAGNOSIS — I10 Essential (primary) hypertension: Secondary | ICD-10-CM | POA: Diagnosis not present

## 2019-10-17 DIAGNOSIS — F419 Anxiety disorder, unspecified: Secondary | ICD-10-CM | POA: Diagnosis not present

## 2019-10-17 DIAGNOSIS — K208 Other esophagitis without bleeding: Secondary | ICD-10-CM | POA: Diagnosis not present

## 2019-10-17 DIAGNOSIS — G4700 Insomnia, unspecified: Secondary | ICD-10-CM | POA: Diagnosis not present

## 2019-10-17 LAB — NOVEL CORONAVIRUS, NAA: SARS-CoV-2, NAA: NOT DETECTED

## 2019-10-17 LAB — SARS-COV-2, NAA 2 DAY TAT

## 2019-10-18 ENCOUNTER — Telehealth: Payer: Self-pay

## 2019-10-18 NOTE — Telephone Encounter (Signed)
Incoming call received from patients sister asking about TB lab test results.  I informed [name] that results were discussed with patient on 10/16/2019. [name] states we should be calling her for her sister does not relay information to her as she should. Phone numbers updated to reflect calling Cyndia Skeeters requested that I fax results to [facility] @ [phone]  Phone number for Cherlyn Cushing is [phone]  TB lab results faxed as requested

## 2019-10-19 DIAGNOSIS — M19011 Primary osteoarthritis, right shoulder: Secondary | ICD-10-CM | POA: Diagnosis not present

## 2019-10-19 DIAGNOSIS — F419 Anxiety disorder, unspecified: Secondary | ICD-10-CM | POA: Diagnosis not present

## 2019-10-19 DIAGNOSIS — G4700 Insomnia, unspecified: Secondary | ICD-10-CM | POA: Diagnosis not present

## 2019-10-19 DIAGNOSIS — G7241 Inclusion body myositis [IBM]: Secondary | ICD-10-CM | POA: Diagnosis not present

## 2019-10-19 DIAGNOSIS — M8580 Other specified disorders of bone density and structure, unspecified site: Secondary | ICD-10-CM | POA: Diagnosis not present

## 2019-10-19 DIAGNOSIS — K208 Other esophagitis without bleeding: Secondary | ICD-10-CM | POA: Diagnosis not present

## 2019-10-19 DIAGNOSIS — I10 Essential (primary) hypertension: Secondary | ICD-10-CM | POA: Diagnosis not present

## 2019-10-19 DIAGNOSIS — G3184 Mild cognitive impairment, so stated: Secondary | ICD-10-CM | POA: Diagnosis not present

## 2019-10-25 DIAGNOSIS — I10 Essential (primary) hypertension: Secondary | ICD-10-CM | POA: Diagnosis not present

## 2019-10-27 ENCOUNTER — Telehealth: Payer: Self-pay

## 2019-10-27 NOTE — Telephone Encounter (Signed)
Marilyn [name] called and said that the facility [facility] would like progress notes from [facility] last visit  Sent to  [phone] Webb Silversmith the nurse. I faxed the last note on 10/27/19 @ 11:40pm

## 2019-11-02 ENCOUNTER — Ambulatory Visit: Payer: Medicare Other | Admitting: Internal Medicine

## 2019-11-10 DIAGNOSIS — L309 Dermatitis, unspecified: Secondary | ICD-10-CM | POA: Diagnosis not present

## 2019-11-13 DIAGNOSIS — R2681 Unsteadiness on feet: Secondary | ICD-10-CM | POA: Diagnosis not present

## 2019-11-13 DIAGNOSIS — M6281 Muscle weakness (generalized): Secondary | ICD-10-CM | POA: Diagnosis not present

## 2019-11-13 DIAGNOSIS — G3184 Mild cognitive impairment, so stated: Secondary | ICD-10-CM | POA: Diagnosis not present

## 2019-11-13 DIAGNOSIS — M542 Cervicalgia: Secondary | ICD-10-CM | POA: Diagnosis not present

## 2019-11-14 ENCOUNTER — Encounter: Payer: Medicare Other | Admitting: Family

## 2019-11-15 DIAGNOSIS — G3184 Mild cognitive impairment, so stated: Secondary | ICD-10-CM | POA: Diagnosis not present

## 2019-11-15 DIAGNOSIS — M542 Cervicalgia: Secondary | ICD-10-CM | POA: Diagnosis not present

## 2019-11-15 DIAGNOSIS — R2681 Unsteadiness on feet: Secondary | ICD-10-CM | POA: Diagnosis not present

## 2019-11-15 DIAGNOSIS — M6281 Muscle weakness (generalized): Secondary | ICD-10-CM | POA: Diagnosis not present

## 2019-11-17 DIAGNOSIS — G3184 Mild cognitive impairment, so stated: Secondary | ICD-10-CM | POA: Diagnosis not present

## 2019-11-17 DIAGNOSIS — R2681 Unsteadiness on feet: Secondary | ICD-10-CM | POA: Diagnosis not present

## 2019-11-17 DIAGNOSIS — M542 Cervicalgia: Secondary | ICD-10-CM | POA: Diagnosis not present

## 2019-11-17 DIAGNOSIS — M6281 Muscle weakness (generalized): Secondary | ICD-10-CM | POA: Diagnosis not present

## 2019-11-20 DIAGNOSIS — G3184 Mild cognitive impairment, so stated: Secondary | ICD-10-CM | POA: Diagnosis not present

## 2019-11-20 DIAGNOSIS — M542 Cervicalgia: Secondary | ICD-10-CM | POA: Diagnosis not present

## 2019-11-20 DIAGNOSIS — R2681 Unsteadiness on feet: Secondary | ICD-10-CM | POA: Diagnosis not present

## 2019-11-20 DIAGNOSIS — M6281 Muscle weakness (generalized): Secondary | ICD-10-CM | POA: Diagnosis not present

## 2019-11-21 DIAGNOSIS — R2681 Unsteadiness on feet: Secondary | ICD-10-CM | POA: Diagnosis not present

## 2019-11-21 DIAGNOSIS — M542 Cervicalgia: Secondary | ICD-10-CM | POA: Diagnosis not present

## 2019-11-21 DIAGNOSIS — M6281 Muscle weakness (generalized): Secondary | ICD-10-CM | POA: Diagnosis not present

## 2019-11-21 DIAGNOSIS — G3184 Mild cognitive impairment, so stated: Secondary | ICD-10-CM | POA: Diagnosis not present

## 2019-11-22 DIAGNOSIS — M6281 Muscle weakness (generalized): Secondary | ICD-10-CM | POA: Diagnosis not present

## 2019-11-22 DIAGNOSIS — M542 Cervicalgia: Secondary | ICD-10-CM | POA: Diagnosis not present

## 2019-11-22 DIAGNOSIS — G3184 Mild cognitive impairment, so stated: Secondary | ICD-10-CM | POA: Diagnosis not present

## 2019-11-22 DIAGNOSIS — R2681 Unsteadiness on feet: Secondary | ICD-10-CM | POA: Diagnosis not present

## 2019-11-23 DIAGNOSIS — G3184 Mild cognitive impairment, so stated: Secondary | ICD-10-CM | POA: Diagnosis not present

## 2019-11-23 DIAGNOSIS — M6281 Muscle weakness (generalized): Secondary | ICD-10-CM | POA: Diagnosis not present

## 2019-11-23 DIAGNOSIS — R2681 Unsteadiness on feet: Secondary | ICD-10-CM | POA: Diagnosis not present

## 2019-11-23 DIAGNOSIS — M542 Cervicalgia: Secondary | ICD-10-CM | POA: Diagnosis not present

## 2019-11-24 DIAGNOSIS — G3184 Mild cognitive impairment, so stated: Secondary | ICD-10-CM | POA: Diagnosis not present

## 2019-11-24 DIAGNOSIS — M542 Cervicalgia: Secondary | ICD-10-CM | POA: Diagnosis not present

## 2019-11-24 DIAGNOSIS — M6281 Muscle weakness (generalized): Secondary | ICD-10-CM | POA: Diagnosis not present

## 2019-11-24 DIAGNOSIS — R42 Dizziness and giddiness: Secondary | ICD-10-CM | POA: Diagnosis not present

## 2019-11-24 DIAGNOSIS — R2681 Unsteadiness on feet: Secondary | ICD-10-CM | POA: Diagnosis not present

## 2019-11-27 DIAGNOSIS — M542 Cervicalgia: Secondary | ICD-10-CM | POA: Diagnosis not present

## 2019-11-27 DIAGNOSIS — M6281 Muscle weakness (generalized): Secondary | ICD-10-CM | POA: Diagnosis not present

## 2019-11-27 DIAGNOSIS — G3184 Mild cognitive impairment, so stated: Secondary | ICD-10-CM | POA: Diagnosis not present

## 2019-11-27 DIAGNOSIS — R2681 Unsteadiness on feet: Secondary | ICD-10-CM | POA: Diagnosis not present

## 2019-11-28 DIAGNOSIS — G3184 Mild cognitive impairment, so stated: Secondary | ICD-10-CM | POA: Diagnosis not present

## 2019-11-28 DIAGNOSIS — M542 Cervicalgia: Secondary | ICD-10-CM | POA: Diagnosis not present

## 2019-11-28 DIAGNOSIS — M6281 Muscle weakness (generalized): Secondary | ICD-10-CM | POA: Diagnosis not present

## 2019-11-28 DIAGNOSIS — R2681 Unsteadiness on feet: Secondary | ICD-10-CM | POA: Diagnosis not present

## 2019-11-29 DIAGNOSIS — G3184 Mild cognitive impairment, so stated: Secondary | ICD-10-CM | POA: Diagnosis not present

## 2019-11-29 DIAGNOSIS — M542 Cervicalgia: Secondary | ICD-10-CM | POA: Diagnosis not present

## 2019-11-29 DIAGNOSIS — R2681 Unsteadiness on feet: Secondary | ICD-10-CM | POA: Diagnosis not present

## 2019-11-29 DIAGNOSIS — M6281 Muscle weakness (generalized): Secondary | ICD-10-CM | POA: Diagnosis not present

## 2019-11-30 DIAGNOSIS — R2681 Unsteadiness on feet: Secondary | ICD-10-CM | POA: Diagnosis not present

## 2019-11-30 DIAGNOSIS — M6281 Muscle weakness (generalized): Secondary | ICD-10-CM | POA: Diagnosis not present

## 2019-11-30 DIAGNOSIS — G3184 Mild cognitive impairment, so stated: Secondary | ICD-10-CM | POA: Diagnosis not present

## 2019-11-30 DIAGNOSIS — M542 Cervicalgia: Secondary | ICD-10-CM | POA: Diagnosis not present

## 2019-12-01 DIAGNOSIS — R2681 Unsteadiness on feet: Secondary | ICD-10-CM | POA: Diagnosis not present

## 2019-12-01 DIAGNOSIS — M6281 Muscle weakness (generalized): Secondary | ICD-10-CM | POA: Diagnosis not present

## 2019-12-01 DIAGNOSIS — G3184 Mild cognitive impairment, so stated: Secondary | ICD-10-CM | POA: Diagnosis not present

## 2019-12-01 DIAGNOSIS — M542 Cervicalgia: Secondary | ICD-10-CM | POA: Diagnosis not present

## 2019-12-04 DIAGNOSIS — M6281 Muscle weakness (generalized): Secondary | ICD-10-CM | POA: Diagnosis not present

## 2019-12-04 DIAGNOSIS — M542 Cervicalgia: Secondary | ICD-10-CM | POA: Diagnosis not present

## 2019-12-04 DIAGNOSIS — R2681 Unsteadiness on feet: Secondary | ICD-10-CM | POA: Diagnosis not present

## 2019-12-04 DIAGNOSIS — G3184 Mild cognitive impairment, so stated: Secondary | ICD-10-CM | POA: Diagnosis not present

## 2019-12-05 DIAGNOSIS — G3184 Mild cognitive impairment, so stated: Secondary | ICD-10-CM | POA: Diagnosis not present

## 2019-12-05 DIAGNOSIS — R2681 Unsteadiness on feet: Secondary | ICD-10-CM | POA: Diagnosis not present

## 2019-12-05 DIAGNOSIS — M542 Cervicalgia: Secondary | ICD-10-CM | POA: Diagnosis not present

## 2019-12-05 DIAGNOSIS — M6281 Muscle weakness (generalized): Secondary | ICD-10-CM | POA: Diagnosis not present

## 2019-12-06 DIAGNOSIS — G3184 Mild cognitive impairment, so stated: Secondary | ICD-10-CM | POA: Diagnosis not present

## 2019-12-06 DIAGNOSIS — R2681 Unsteadiness on feet: Secondary | ICD-10-CM | POA: Diagnosis not present

## 2019-12-06 DIAGNOSIS — M542 Cervicalgia: Secondary | ICD-10-CM | POA: Diagnosis not present

## 2019-12-06 DIAGNOSIS — M6281 Muscle weakness (generalized): Secondary | ICD-10-CM | POA: Diagnosis not present

## 2019-12-07 DIAGNOSIS — M6281 Muscle weakness (generalized): Secondary | ICD-10-CM | POA: Diagnosis not present

## 2019-12-07 DIAGNOSIS — R2681 Unsteadiness on feet: Secondary | ICD-10-CM | POA: Diagnosis not present

## 2019-12-07 DIAGNOSIS — M542 Cervicalgia: Secondary | ICD-10-CM | POA: Diagnosis not present

## 2019-12-07 DIAGNOSIS — G3184 Mild cognitive impairment, so stated: Secondary | ICD-10-CM | POA: Diagnosis not present

## 2019-12-08 DIAGNOSIS — M6281 Muscle weakness (generalized): Secondary | ICD-10-CM | POA: Diagnosis not present

## 2019-12-08 DIAGNOSIS — M542 Cervicalgia: Secondary | ICD-10-CM | POA: Diagnosis not present

## 2019-12-08 DIAGNOSIS — R2681 Unsteadiness on feet: Secondary | ICD-10-CM | POA: Diagnosis not present

## 2019-12-08 DIAGNOSIS — G3184 Mild cognitive impairment, so stated: Secondary | ICD-10-CM | POA: Diagnosis not present

## 2019-12-12 DIAGNOSIS — R2681 Unsteadiness on feet: Secondary | ICD-10-CM | POA: Diagnosis not present

## 2019-12-12 DIAGNOSIS — G3184 Mild cognitive impairment, so stated: Secondary | ICD-10-CM | POA: Diagnosis not present

## 2019-12-12 DIAGNOSIS — M542 Cervicalgia: Secondary | ICD-10-CM | POA: Diagnosis not present

## 2019-12-12 DIAGNOSIS — M6281 Muscle weakness (generalized): Secondary | ICD-10-CM | POA: Diagnosis not present

## 2019-12-13 DIAGNOSIS — M542 Cervicalgia: Secondary | ICD-10-CM | POA: Diagnosis not present

## 2019-12-13 DIAGNOSIS — M6281 Muscle weakness (generalized): Secondary | ICD-10-CM | POA: Diagnosis not present

## 2019-12-13 DIAGNOSIS — R2681 Unsteadiness on feet: Secondary | ICD-10-CM | POA: Diagnosis not present

## 2019-12-13 DIAGNOSIS — G3184 Mild cognitive impairment, so stated: Secondary | ICD-10-CM | POA: Diagnosis not present

## 2019-12-14 DIAGNOSIS — G3184 Mild cognitive impairment, so stated: Secondary | ICD-10-CM | POA: Diagnosis not present

## 2019-12-14 DIAGNOSIS — M542 Cervicalgia: Secondary | ICD-10-CM | POA: Diagnosis not present

## 2019-12-14 DIAGNOSIS — R2681 Unsteadiness on feet: Secondary | ICD-10-CM | POA: Diagnosis not present

## 2019-12-14 DIAGNOSIS — M6281 Muscle weakness (generalized): Secondary | ICD-10-CM | POA: Diagnosis not present

## 2019-12-15 DIAGNOSIS — G3184 Mild cognitive impairment, so stated: Secondary | ICD-10-CM | POA: Diagnosis not present

## 2019-12-15 DIAGNOSIS — M542 Cervicalgia: Secondary | ICD-10-CM | POA: Diagnosis not present

## 2019-12-15 DIAGNOSIS — R2681 Unsteadiness on feet: Secondary | ICD-10-CM | POA: Diagnosis not present

## 2019-12-15 DIAGNOSIS — M6281 Muscle weakness (generalized): Secondary | ICD-10-CM | POA: Diagnosis not present

## 2019-12-18 DIAGNOSIS — G3184 Mild cognitive impairment, so stated: Secondary | ICD-10-CM | POA: Diagnosis not present

## 2019-12-18 DIAGNOSIS — R2681 Unsteadiness on feet: Secondary | ICD-10-CM | POA: Diagnosis not present

## 2019-12-18 DIAGNOSIS — M6281 Muscle weakness (generalized): Secondary | ICD-10-CM | POA: Diagnosis not present

## 2019-12-18 DIAGNOSIS — M542 Cervicalgia: Secondary | ICD-10-CM | POA: Diagnosis not present

## 2019-12-19 DIAGNOSIS — M542 Cervicalgia: Secondary | ICD-10-CM | POA: Diagnosis not present

## 2019-12-19 DIAGNOSIS — M6281 Muscle weakness (generalized): Secondary | ICD-10-CM | POA: Diagnosis not present

## 2019-12-19 DIAGNOSIS — R2681 Unsteadiness on feet: Secondary | ICD-10-CM | POA: Diagnosis not present

## 2019-12-19 DIAGNOSIS — G3184 Mild cognitive impairment, so stated: Secondary | ICD-10-CM | POA: Diagnosis not present

## 2019-12-20 DIAGNOSIS — M6281 Muscle weakness (generalized): Secondary | ICD-10-CM | POA: Diagnosis not present

## 2019-12-20 DIAGNOSIS — G3184 Mild cognitive impairment, so stated: Secondary | ICD-10-CM | POA: Diagnosis not present

## 2019-12-20 DIAGNOSIS — R2681 Unsteadiness on feet: Secondary | ICD-10-CM | POA: Diagnosis not present

## 2019-12-20 DIAGNOSIS — M542 Cervicalgia: Secondary | ICD-10-CM | POA: Diagnosis not present

## 2019-12-21 DIAGNOSIS — G3184 Mild cognitive impairment, so stated: Secondary | ICD-10-CM | POA: Diagnosis not present

## 2019-12-21 DIAGNOSIS — M542 Cervicalgia: Secondary | ICD-10-CM | POA: Diagnosis not present

## 2019-12-21 DIAGNOSIS — M6281 Muscle weakness (generalized): Secondary | ICD-10-CM | POA: Diagnosis not present

## 2019-12-21 DIAGNOSIS — R2681 Unsteadiness on feet: Secondary | ICD-10-CM | POA: Diagnosis not present

## 2019-12-22 DIAGNOSIS — M6281 Muscle weakness (generalized): Secondary | ICD-10-CM | POA: Diagnosis not present

## 2019-12-22 DIAGNOSIS — M542 Cervicalgia: Secondary | ICD-10-CM | POA: Diagnosis not present

## 2019-12-22 DIAGNOSIS — G3184 Mild cognitive impairment, so stated: Secondary | ICD-10-CM | POA: Diagnosis not present

## 2019-12-22 DIAGNOSIS — R2681 Unsteadiness on feet: Secondary | ICD-10-CM | POA: Diagnosis not present

## 2019-12-25 DIAGNOSIS — G3184 Mild cognitive impairment, so stated: Secondary | ICD-10-CM | POA: Diagnosis not present

## 2019-12-25 DIAGNOSIS — M6281 Muscle weakness (generalized): Secondary | ICD-10-CM | POA: Diagnosis not present

## 2019-12-25 DIAGNOSIS — R2681 Unsteadiness on feet: Secondary | ICD-10-CM | POA: Diagnosis not present

## 2019-12-25 DIAGNOSIS — M542 Cervicalgia: Secondary | ICD-10-CM | POA: Diagnosis not present

## 2019-12-26 DIAGNOSIS — G3184 Mild cognitive impairment, so stated: Secondary | ICD-10-CM | POA: Diagnosis not present

## 2019-12-26 DIAGNOSIS — M6281 Muscle weakness (generalized): Secondary | ICD-10-CM | POA: Diagnosis not present

## 2019-12-26 DIAGNOSIS — M542 Cervicalgia: Secondary | ICD-10-CM | POA: Diagnosis not present

## 2019-12-26 DIAGNOSIS — R2681 Unsteadiness on feet: Secondary | ICD-10-CM | POA: Diagnosis not present

## 2019-12-27 DIAGNOSIS — R2681 Unsteadiness on feet: Secondary | ICD-10-CM | POA: Diagnosis not present

## 2019-12-27 DIAGNOSIS — M6281 Muscle weakness (generalized): Secondary | ICD-10-CM | POA: Diagnosis not present

## 2019-12-27 DIAGNOSIS — M542 Cervicalgia: Secondary | ICD-10-CM | POA: Diagnosis not present

## 2019-12-27 DIAGNOSIS — G3184 Mild cognitive impairment, so stated: Secondary | ICD-10-CM | POA: Diagnosis not present

## 2019-12-28 DIAGNOSIS — M542 Cervicalgia: Secondary | ICD-10-CM | POA: Diagnosis not present

## 2019-12-28 DIAGNOSIS — R2681 Unsteadiness on feet: Secondary | ICD-10-CM | POA: Diagnosis not present

## 2019-12-28 DIAGNOSIS — M6281 Muscle weakness (generalized): Secondary | ICD-10-CM | POA: Diagnosis not present

## 2019-12-28 DIAGNOSIS — G3184 Mild cognitive impairment, so stated: Secondary | ICD-10-CM | POA: Diagnosis not present

## 2019-12-29 DIAGNOSIS — M542 Cervicalgia: Secondary | ICD-10-CM | POA: Diagnosis not present

## 2019-12-29 DIAGNOSIS — R2681 Unsteadiness on feet: Secondary | ICD-10-CM | POA: Diagnosis not present

## 2019-12-29 DIAGNOSIS — M6281 Muscle weakness (generalized): Secondary | ICD-10-CM | POA: Diagnosis not present

## 2019-12-29 DIAGNOSIS — G3184 Mild cognitive impairment, so stated: Secondary | ICD-10-CM | POA: Diagnosis not present

## 2020-01-01 DIAGNOSIS — M542 Cervicalgia: Secondary | ICD-10-CM | POA: Diagnosis not present

## 2020-01-01 DIAGNOSIS — M6281 Muscle weakness (generalized): Secondary | ICD-10-CM | POA: Diagnosis not present

## 2020-01-01 DIAGNOSIS — R2681 Unsteadiness on feet: Secondary | ICD-10-CM | POA: Diagnosis not present

## 2020-01-01 DIAGNOSIS — G3184 Mild cognitive impairment, so stated: Secondary | ICD-10-CM | POA: Diagnosis not present

## 2020-01-02 DIAGNOSIS — G3184 Mild cognitive impairment, so stated: Secondary | ICD-10-CM | POA: Diagnosis not present

## 2020-01-02 DIAGNOSIS — R2681 Unsteadiness on feet: Secondary | ICD-10-CM | POA: Diagnosis not present

## 2020-01-02 DIAGNOSIS — M6281 Muscle weakness (generalized): Secondary | ICD-10-CM | POA: Diagnosis not present

## 2020-01-02 DIAGNOSIS — M542 Cervicalgia: Secondary | ICD-10-CM | POA: Diagnosis not present

## 2020-01-03 DIAGNOSIS — G3184 Mild cognitive impairment, so stated: Secondary | ICD-10-CM | POA: Diagnosis not present

## 2020-01-03 DIAGNOSIS — M542 Cervicalgia: Secondary | ICD-10-CM | POA: Diagnosis not present

## 2020-01-03 DIAGNOSIS — R2681 Unsteadiness on feet: Secondary | ICD-10-CM | POA: Diagnosis not present

## 2020-01-03 DIAGNOSIS — M6281 Muscle weakness (generalized): Secondary | ICD-10-CM | POA: Diagnosis not present

## 2020-01-04 DIAGNOSIS — M6281 Muscle weakness (generalized): Secondary | ICD-10-CM | POA: Diagnosis not present

## 2020-01-04 DIAGNOSIS — M542 Cervicalgia: Secondary | ICD-10-CM | POA: Diagnosis not present

## 2020-01-04 DIAGNOSIS — R2681 Unsteadiness on feet: Secondary | ICD-10-CM | POA: Diagnosis not present

## 2020-01-04 DIAGNOSIS — G3184 Mild cognitive impairment, so stated: Secondary | ICD-10-CM | POA: Diagnosis not present

## 2020-01-05 DIAGNOSIS — G3184 Mild cognitive impairment, so stated: Secondary | ICD-10-CM | POA: Diagnosis not present

## 2020-01-05 DIAGNOSIS — M6281 Muscle weakness (generalized): Secondary | ICD-10-CM | POA: Diagnosis not present

## 2020-01-05 DIAGNOSIS — M542 Cervicalgia: Secondary | ICD-10-CM | POA: Diagnosis not present

## 2020-01-05 DIAGNOSIS — R2681 Unsteadiness on feet: Secondary | ICD-10-CM | POA: Diagnosis not present

## 2020-01-07 ENCOUNTER — Other Ambulatory Visit: Payer: Self-pay | Admitting: Internal Medicine

## 2020-01-07 DIAGNOSIS — I10 Essential (primary) hypertension: Secondary | ICD-10-CM

## 2020-01-08 DIAGNOSIS — M542 Cervicalgia: Secondary | ICD-10-CM | POA: Diagnosis not present

## 2020-01-08 DIAGNOSIS — G3184 Mild cognitive impairment, so stated: Secondary | ICD-10-CM | POA: Diagnosis not present

## 2020-01-08 DIAGNOSIS — R2681 Unsteadiness on feet: Secondary | ICD-10-CM | POA: Diagnosis not present

## 2020-01-08 DIAGNOSIS — M6281 Muscle weakness (generalized): Secondary | ICD-10-CM | POA: Diagnosis not present

## 2020-01-09 DIAGNOSIS — G3184 Mild cognitive impairment, so stated: Secondary | ICD-10-CM | POA: Diagnosis not present

## 2020-01-09 DIAGNOSIS — R2681 Unsteadiness on feet: Secondary | ICD-10-CM | POA: Diagnosis not present

## 2020-01-09 DIAGNOSIS — M6281 Muscle weakness (generalized): Secondary | ICD-10-CM | POA: Diagnosis not present

## 2020-01-09 DIAGNOSIS — M542 Cervicalgia: Secondary | ICD-10-CM | POA: Diagnosis not present

## 2020-01-10 DIAGNOSIS — M542 Cervicalgia: Secondary | ICD-10-CM | POA: Diagnosis not present

## 2020-01-10 DIAGNOSIS — M6281 Muscle weakness (generalized): Secondary | ICD-10-CM | POA: Diagnosis not present

## 2020-01-10 DIAGNOSIS — R2681 Unsteadiness on feet: Secondary | ICD-10-CM | POA: Diagnosis not present

## 2020-01-10 DIAGNOSIS — G3184 Mild cognitive impairment, so stated: Secondary | ICD-10-CM | POA: Diagnosis not present

## 2020-01-11 DIAGNOSIS — M6281 Muscle weakness (generalized): Secondary | ICD-10-CM | POA: Diagnosis not present

## 2020-01-11 DIAGNOSIS — G3184 Mild cognitive impairment, so stated: Secondary | ICD-10-CM | POA: Diagnosis not present

## 2020-01-11 DIAGNOSIS — R2681 Unsteadiness on feet: Secondary | ICD-10-CM | POA: Diagnosis not present

## 2020-01-11 DIAGNOSIS — M542 Cervicalgia: Secondary | ICD-10-CM | POA: Diagnosis not present

## 2020-01-12 DIAGNOSIS — M6281 Muscle weakness (generalized): Secondary | ICD-10-CM | POA: Diagnosis not present

## 2020-01-12 DIAGNOSIS — M542 Cervicalgia: Secondary | ICD-10-CM | POA: Diagnosis not present

## 2020-01-12 DIAGNOSIS — G3184 Mild cognitive impairment, so stated: Secondary | ICD-10-CM | POA: Diagnosis not present

## 2020-01-12 DIAGNOSIS — R2681 Unsteadiness on feet: Secondary | ICD-10-CM | POA: Diagnosis not present

## 2020-01-15 DIAGNOSIS — R2681 Unsteadiness on feet: Secondary | ICD-10-CM | POA: Diagnosis not present

## 2020-01-15 DIAGNOSIS — M542 Cervicalgia: Secondary | ICD-10-CM | POA: Diagnosis not present

## 2020-01-15 DIAGNOSIS — M6281 Muscle weakness (generalized): Secondary | ICD-10-CM | POA: Diagnosis not present

## 2020-01-15 DIAGNOSIS — G3184 Mild cognitive impairment, so stated: Secondary | ICD-10-CM | POA: Diagnosis not present

## 2020-01-16 DIAGNOSIS — R2681 Unsteadiness on feet: Secondary | ICD-10-CM | POA: Diagnosis not present

## 2020-01-16 DIAGNOSIS — M542 Cervicalgia: Secondary | ICD-10-CM | POA: Diagnosis not present

## 2020-01-16 DIAGNOSIS — G3184 Mild cognitive impairment, so stated: Secondary | ICD-10-CM | POA: Diagnosis not present

## 2020-01-16 DIAGNOSIS — M6281 Muscle weakness (generalized): Secondary | ICD-10-CM | POA: Diagnosis not present

## 2020-01-17 DIAGNOSIS — M6281 Muscle weakness (generalized): Secondary | ICD-10-CM | POA: Diagnosis not present

## 2020-01-17 DIAGNOSIS — M542 Cervicalgia: Secondary | ICD-10-CM | POA: Diagnosis not present

## 2020-01-17 DIAGNOSIS — R2681 Unsteadiness on feet: Secondary | ICD-10-CM | POA: Diagnosis not present

## 2020-01-17 DIAGNOSIS — G3184 Mild cognitive impairment, so stated: Secondary | ICD-10-CM | POA: Diagnosis not present

## 2020-01-18 DIAGNOSIS — G3184 Mild cognitive impairment, so stated: Secondary | ICD-10-CM | POA: Diagnosis not present

## 2020-01-18 DIAGNOSIS — M6281 Muscle weakness (generalized): Secondary | ICD-10-CM | POA: Diagnosis not present

## 2020-01-18 DIAGNOSIS — M542 Cervicalgia: Secondary | ICD-10-CM | POA: Diagnosis not present

## 2020-01-18 DIAGNOSIS — R2681 Unsteadiness on feet: Secondary | ICD-10-CM | POA: Diagnosis not present

## 2020-01-19 DIAGNOSIS — G3184 Mild cognitive impairment, so stated: Secondary | ICD-10-CM | POA: Diagnosis not present

## 2020-01-19 DIAGNOSIS — R2681 Unsteadiness on feet: Secondary | ICD-10-CM | POA: Diagnosis not present

## 2020-01-19 DIAGNOSIS — M542 Cervicalgia: Secondary | ICD-10-CM | POA: Diagnosis not present

## 2020-01-19 DIAGNOSIS — M6281 Muscle weakness (generalized): Secondary | ICD-10-CM | POA: Diagnosis not present

## 2020-01-22 DIAGNOSIS — R2681 Unsteadiness on feet: Secondary | ICD-10-CM | POA: Diagnosis not present

## 2020-01-22 DIAGNOSIS — M542 Cervicalgia: Secondary | ICD-10-CM | POA: Diagnosis not present

## 2020-01-22 DIAGNOSIS — G3184 Mild cognitive impairment, so stated: Secondary | ICD-10-CM | POA: Diagnosis not present

## 2020-01-22 DIAGNOSIS — M6281 Muscle weakness (generalized): Secondary | ICD-10-CM | POA: Diagnosis not present

## 2020-01-23 DIAGNOSIS — G3184 Mild cognitive impairment, so stated: Secondary | ICD-10-CM | POA: Diagnosis not present

## 2020-01-23 DIAGNOSIS — M542 Cervicalgia: Secondary | ICD-10-CM | POA: Diagnosis not present

## 2020-01-23 DIAGNOSIS — R2681 Unsteadiness on feet: Secondary | ICD-10-CM | POA: Diagnosis not present

## 2020-01-23 DIAGNOSIS — M6281 Muscle weakness (generalized): Secondary | ICD-10-CM | POA: Diagnosis not present

## 2020-01-25 DIAGNOSIS — M6281 Muscle weakness (generalized): Secondary | ICD-10-CM | POA: Diagnosis not present

## 2020-01-25 DIAGNOSIS — G3184 Mild cognitive impairment, so stated: Secondary | ICD-10-CM | POA: Diagnosis not present

## 2020-01-25 DIAGNOSIS — R2681 Unsteadiness on feet: Secondary | ICD-10-CM | POA: Diagnosis not present

## 2020-01-25 DIAGNOSIS — M542 Cervicalgia: Secondary | ICD-10-CM | POA: Diagnosis not present

## 2020-01-26 DIAGNOSIS — M6281 Muscle weakness (generalized): Secondary | ICD-10-CM | POA: Diagnosis not present

## 2020-01-26 DIAGNOSIS — M542 Cervicalgia: Secondary | ICD-10-CM | POA: Diagnosis not present

## 2020-01-26 DIAGNOSIS — R2681 Unsteadiness on feet: Secondary | ICD-10-CM | POA: Diagnosis not present

## 2020-01-26 DIAGNOSIS — G3184 Mild cognitive impairment, so stated: Secondary | ICD-10-CM | POA: Diagnosis not present

## 2020-01-29 DIAGNOSIS — M542 Cervicalgia: Secondary | ICD-10-CM | POA: Diagnosis not present

## 2020-01-29 DIAGNOSIS — R2681 Unsteadiness on feet: Secondary | ICD-10-CM | POA: Diagnosis not present

## 2020-01-29 DIAGNOSIS — G3184 Mild cognitive impairment, so stated: Secondary | ICD-10-CM | POA: Diagnosis not present

## 2020-01-29 DIAGNOSIS — M6281 Muscle weakness (generalized): Secondary | ICD-10-CM | POA: Diagnosis not present

## 2020-01-30 DIAGNOSIS — M542 Cervicalgia: Secondary | ICD-10-CM | POA: Diagnosis not present

## 2020-01-30 DIAGNOSIS — M6281 Muscle weakness (generalized): Secondary | ICD-10-CM | POA: Diagnosis not present

## 2020-01-30 DIAGNOSIS — G3184 Mild cognitive impairment, so stated: Secondary | ICD-10-CM | POA: Diagnosis not present

## 2020-01-30 DIAGNOSIS — R2681 Unsteadiness on feet: Secondary | ICD-10-CM | POA: Diagnosis not present

## 2020-01-31 DIAGNOSIS — M542 Cervicalgia: Secondary | ICD-10-CM | POA: Diagnosis not present

## 2020-01-31 DIAGNOSIS — R2681 Unsteadiness on feet: Secondary | ICD-10-CM | POA: Diagnosis not present

## 2020-01-31 DIAGNOSIS — G3184 Mild cognitive impairment, so stated: Secondary | ICD-10-CM | POA: Diagnosis not present

## 2020-01-31 DIAGNOSIS — M6281 Muscle weakness (generalized): Secondary | ICD-10-CM | POA: Diagnosis not present

## 2020-02-05 DIAGNOSIS — G3184 Mild cognitive impairment, so stated: Secondary | ICD-10-CM | POA: Diagnosis not present

## 2020-02-05 DIAGNOSIS — M542 Cervicalgia: Secondary | ICD-10-CM | POA: Diagnosis not present

## 2020-02-05 DIAGNOSIS — M6281 Muscle weakness (generalized): Secondary | ICD-10-CM | POA: Diagnosis not present

## 2020-02-05 DIAGNOSIS — R2681 Unsteadiness on feet: Secondary | ICD-10-CM | POA: Diagnosis not present

## 2020-02-06 DIAGNOSIS — M542 Cervicalgia: Secondary | ICD-10-CM | POA: Diagnosis not present

## 2020-02-06 DIAGNOSIS — R2681 Unsteadiness on feet: Secondary | ICD-10-CM | POA: Diagnosis not present

## 2020-02-06 DIAGNOSIS — G3184 Mild cognitive impairment, so stated: Secondary | ICD-10-CM | POA: Diagnosis not present

## 2020-02-06 DIAGNOSIS — M6281 Muscle weakness (generalized): Secondary | ICD-10-CM | POA: Diagnosis not present

## 2020-09-27 ENCOUNTER — Other Ambulatory Visit: Payer: Self-pay | Admitting: Internal Medicine
